# Patient Record
Sex: Female | Born: 1935 | Race: White | Hispanic: No | Smoking: Never smoker
Health system: Southern US, Community
[De-identification: ages and names within clinical notes are randomized; demographics above are authoritative.]

## PROBLEM LIST (undated history)

## (undated) DIAGNOSIS — M1990 Unspecified osteoarthritis, unspecified site: Secondary | ICD-10-CM

## (undated) DIAGNOSIS — I10 Essential (primary) hypertension: Secondary | ICD-10-CM

## (undated) DIAGNOSIS — K589 Irritable bowel syndrome without diarrhea: Secondary | ICD-10-CM

## (undated) DIAGNOSIS — K219 Gastro-esophageal reflux disease without esophagitis: Secondary | ICD-10-CM

## (undated) DIAGNOSIS — T7840XA Allergy, unspecified, initial encounter: Secondary | ICD-10-CM

## (undated) DIAGNOSIS — E785 Hyperlipidemia, unspecified: Secondary | ICD-10-CM

## (undated) DIAGNOSIS — K5730 Diverticulosis of large intestine without perforation or abscess without bleeding: Secondary | ICD-10-CM

## (undated) DIAGNOSIS — D126 Benign neoplasm of colon, unspecified: Secondary | ICD-10-CM

## (undated) DIAGNOSIS — J42 Unspecified chronic bronchitis: Secondary | ICD-10-CM

## (undated) DIAGNOSIS — K648 Other hemorrhoids: Secondary | ICD-10-CM

## (undated) HISTORY — PX: DENTAL SURGERY: SHX609

## (undated) HISTORY — DX: Essential (primary) hypertension: I10

## (undated) HISTORY — DX: Irritable bowel syndrome without diarrhea: K58.9

## (undated) HISTORY — DX: Diverticulosis of large intestine without perforation or abscess without bleeding: K57.30

## (undated) HISTORY — DX: Hyperlipidemia, unspecified: E78.5

## (undated) HISTORY — PX: COLONOSCOPY: SHX174

## (undated) HISTORY — DX: Unspecified osteoarthritis, unspecified site: M19.90

## (undated) HISTORY — DX: Other hemorrhoids: K64.8

## (undated) HISTORY — PX: VAGINAL HYSTERECTOMY: SUR661

## (undated) HISTORY — DX: Unspecified chronic bronchitis: J42

## (undated) HISTORY — PX: POLYPECTOMY: SHX149

## (undated) HISTORY — DX: Benign neoplasm of colon, unspecified: D12.6

## (undated) HISTORY — DX: Gastro-esophageal reflux disease without esophagitis: K21.9

## (undated) HISTORY — DX: Allergy, unspecified, initial encounter: T78.40XA

---

## 2000-06-15 ENCOUNTER — Other Ambulatory Visit: Admission: RE | Admit: 2000-06-15 | Discharge: 2000-06-15 | Payer: Self-pay | Admitting: Gynecology

## 2004-04-25 ENCOUNTER — Ambulatory Visit: Payer: Self-pay | Admitting: Pulmonary Disease

## 2004-07-18 ENCOUNTER — Ambulatory Visit: Payer: Self-pay | Admitting: Pulmonary Disease

## 2004-08-29 ENCOUNTER — Ambulatory Visit: Payer: Self-pay | Admitting: Cardiology

## 2004-09-12 ENCOUNTER — Ambulatory Visit: Payer: Self-pay | Admitting: Internal Medicine

## 2004-10-02 ENCOUNTER — Ambulatory Visit: Payer: Self-pay | Admitting: Cardiology

## 2004-10-25 ENCOUNTER — Ambulatory Visit: Payer: Self-pay | Admitting: Cardiology

## 2004-11-08 ENCOUNTER — Ambulatory Visit: Payer: Self-pay | Admitting: Cardiology

## 2004-12-09 ENCOUNTER — Other Ambulatory Visit: Admission: RE | Admit: 2004-12-09 | Discharge: 2004-12-09 | Payer: Self-pay | Admitting: Gynecology

## 2004-12-12 ENCOUNTER — Ambulatory Visit: Payer: Self-pay | Admitting: Cardiology

## 2005-01-20 ENCOUNTER — Ambulatory Visit: Payer: Self-pay | Admitting: Cardiology

## 2005-01-24 ENCOUNTER — Ambulatory Visit: Payer: Self-pay | Admitting: Cardiology

## 2005-04-22 ENCOUNTER — Ambulatory Visit: Payer: Self-pay | Admitting: Pulmonary Disease

## 2005-06-17 ENCOUNTER — Ambulatory Visit: Payer: Self-pay | Admitting: Pulmonary Disease

## 2005-06-25 ENCOUNTER — Ambulatory Visit: Payer: Self-pay | Admitting: Gastroenterology

## 2005-07-15 ENCOUNTER — Encounter: Payer: Self-pay | Admitting: Gastroenterology

## 2005-07-15 ENCOUNTER — Ambulatory Visit: Payer: Self-pay | Admitting: Gastroenterology

## 2006-09-14 ENCOUNTER — Ambulatory Visit: Payer: Self-pay | Admitting: Pulmonary Disease

## 2006-10-07 ENCOUNTER — Ambulatory Visit: Payer: Self-pay | Admitting: Pulmonary Disease

## 2006-10-07 LAB — CONVERTED CEMR LAB
ALT: 17 units/L (ref 0–35)
AST: 22 units/L (ref 0–37)
Alkaline Phosphatase: 41 units/L (ref 39–117)
BUN: 12 mg/dL (ref 6–23)
Basophils Relative: 1.5 % — ABNORMAL HIGH (ref 0.0–1.0)
Bilirubin, Direct: 0.1 mg/dL (ref 0.0–0.3)
Calcium: 9.2 mg/dL (ref 8.4–10.5)
Chloride: 99 meq/L (ref 96–112)
Cholesterol: 279 mg/dL (ref 0–200)
Eosinophils Absolute: 0.2 10*3/uL (ref 0.0–0.6)
Eosinophils Relative: 3.9 % (ref 0.0–5.0)
GFR calc Af Amer: 79 mL/min
GFR calc non Af Amer: 65 mL/min
Glucose, Bld: 112 mg/dL — ABNORMAL HIGH (ref 70–99)
HDL: 35.9 mg/dL — ABNORMAL LOW (ref >39.0)
MCV: 94.5 fL (ref 78.0–100.0)
Monocytes Relative: 12.7 % — ABNORMAL HIGH (ref 3.0–11.0)
Platelets: 253 10*3/uL (ref 150–400)
RBC: 4.31 M/uL (ref 3.87–5.11)
Triglycerides: 598 mg/dL (ref 0–149)
VLDL: 120 mg/dL — ABNORMAL HIGH (ref 0–40)
WBC: 5.7 10*3/uL (ref 4.5–10.5)

## 2006-10-15 ENCOUNTER — Ambulatory Visit: Payer: Self-pay | Admitting: Pulmonary Disease

## 2006-10-15 DIAGNOSIS — F411 Generalized anxiety disorder: Secondary | ICD-10-CM | POA: Insufficient documentation

## 2006-10-15 DIAGNOSIS — E782 Mixed hyperlipidemia: Secondary | ICD-10-CM

## 2006-10-15 DIAGNOSIS — K648 Other hemorrhoids: Secondary | ICD-10-CM

## 2006-10-15 DIAGNOSIS — J42 Unspecified chronic bronchitis: Secondary | ICD-10-CM | POA: Insufficient documentation

## 2006-10-15 DIAGNOSIS — I10 Essential (primary) hypertension: Secondary | ICD-10-CM

## 2006-10-15 DIAGNOSIS — K219 Gastro-esophageal reflux disease without esophagitis: Secondary | ICD-10-CM | POA: Insufficient documentation

## 2006-10-15 DIAGNOSIS — K589 Irritable bowel syndrome without diarrhea: Secondary | ICD-10-CM

## 2006-10-15 DIAGNOSIS — D126 Benign neoplasm of colon, unspecified: Secondary | ICD-10-CM

## 2006-10-15 HISTORY — DX: Irritable bowel syndrome, unspecified: K58.9

## 2006-10-15 HISTORY — DX: Other hemorrhoids: K64.8

## 2006-10-15 HISTORY — DX: Unspecified chronic bronchitis: J42

## 2006-10-15 HISTORY — DX: Benign neoplasm of colon, unspecified: D12.6

## 2006-12-16 ENCOUNTER — Ambulatory Visit: Payer: Self-pay | Admitting: Pulmonary Disease

## 2007-08-16 ENCOUNTER — Telehealth (INDEPENDENT_AMBULATORY_CARE_PROVIDER_SITE_OTHER): Payer: Self-pay | Admitting: *Deleted

## 2007-09-28 ENCOUNTER — Ambulatory Visit: Payer: Self-pay | Admitting: Pulmonary Disease

## 2007-09-28 DIAGNOSIS — K5730 Diverticulosis of large intestine without perforation or abscess without bleeding: Secondary | ICD-10-CM

## 2007-09-28 DIAGNOSIS — M1990 Unspecified osteoarthritis, unspecified site: Secondary | ICD-10-CM

## 2007-09-28 DIAGNOSIS — J309 Allergic rhinitis, unspecified: Secondary | ICD-10-CM

## 2007-09-28 HISTORY — DX: Diverticulosis of large intestine without perforation or abscess without bleeding: K57.30

## 2007-09-28 HISTORY — DX: Unspecified osteoarthritis, unspecified site: M19.90

## 2007-10-01 ENCOUNTER — Ambulatory Visit: Payer: Self-pay | Admitting: Pulmonary Disease

## 2007-10-11 LAB — CONVERTED CEMR LAB
ALT: 17 units/L (ref 0–35)
AST: 20 units/L (ref 0–37)
Alkaline Phosphatase: 48 units/L (ref 39–117)
Bilirubin, Direct: 0.1 mg/dL (ref 0.0–0.3)
CO2: 33 meq/L — ABNORMAL HIGH (ref 19–32)
Chloride: 99 meq/L (ref 96–112)
Cholesterol: 355 mg/dL (ref 0–200)
Creatinine, Ser: 0.7 mg/dL (ref 0.4–1.2)
GFR calc non Af Amer: 87 mL/min
HCT: 40.3 % (ref 36.0–46.0)
HDL: 21.9 mg/dL — ABNORMAL LOW (ref >39.0)
Hgb A1c MFr Bld: 6 % (ref 4.6–6.0)
Platelets: 227 10*3/uL (ref 150–400)
Potassium: 3.8 meq/L (ref 3.5–5.1)
RDW: 12.5 % (ref 11.5–14.6)
Sodium: 139 meq/L (ref 135–145)
Total Bilirubin: 0.8 mg/dL (ref 0.3–1.2)
Total CHOL/HDL Ratio: 16.2
Triglycerides: 1604 mg/dL (ref 0–149)
WBC: 6.4 10*3/uL (ref 4.5–10.5)

## 2007-10-14 ENCOUNTER — Ambulatory Visit: Payer: Self-pay | Admitting: Internal Medicine

## 2007-11-03 ENCOUNTER — Ambulatory Visit: Payer: Self-pay | Admitting: Pulmonary Disease

## 2007-12-20 ENCOUNTER — Ambulatory Visit: Payer: Self-pay | Admitting: Pulmonary Disease

## 2007-12-20 LAB — CONVERTED CEMR LAB
ALT: 25 units/L (ref 0–35)
Bilirubin, Direct: 0.2 mg/dL (ref 0.0–0.3)
Direct LDL: 73 mg/dL
HDL: 32.9 mg/dL — ABNORMAL LOW (ref >39.0)
Total Bilirubin: 1.1 mg/dL (ref 0.3–1.2)
Total Protein: 7 g/dL (ref 6.0–8.3)
Triglycerides: 419 mg/dL (ref 0–149)
VLDL: 84 mg/dL — ABNORMAL HIGH (ref 0–40)

## 2007-12-23 ENCOUNTER — Ambulatory Visit: Payer: Self-pay | Admitting: Cardiology

## 2008-03-06 ENCOUNTER — Ambulatory Visit: Payer: Self-pay | Admitting: Pulmonary Disease

## 2008-03-16 ENCOUNTER — Ambulatory Visit: Payer: Self-pay | Admitting: Internal Medicine

## 2008-04-07 ENCOUNTER — Telehealth: Payer: Self-pay | Admitting: Pulmonary Disease

## 2008-04-13 ENCOUNTER — Ambulatory Visit: Payer: Self-pay | Admitting: Pulmonary Disease

## 2008-06-04 LAB — CONVERTED CEMR LAB
ALT: 24 units/L (ref 0–35)
AST: 22 units/L (ref 0–37)
Albumin: 3.5 g/dL (ref 3.5–5.2)
BUN: 13 mg/dL (ref 6–23)
CO2: 34 meq/L — ABNORMAL HIGH (ref 19–32)
Calcium: 9.4 mg/dL (ref 8.4–10.5)
Chloride: 104 meq/L (ref 96–112)
Cholesterol: 171 mg/dL (ref 0–200)
Creatinine, Ser: 0.9 mg/dL (ref 0.4–1.2)
Direct LDL: 68.3 mg/dL
Triglycerides: 395 mg/dL (ref 0–149)
VLDL: 79 mg/dL — ABNORMAL HIGH (ref 0–40)

## 2008-06-05 ENCOUNTER — Ambulatory Visit: Payer: Self-pay | Admitting: Pulmonary Disease

## 2008-06-12 ENCOUNTER — Encounter: Payer: Self-pay | Admitting: Internal Medicine

## 2008-06-12 ENCOUNTER — Ambulatory Visit: Payer: Self-pay | Admitting: Cardiology

## 2008-06-20 ENCOUNTER — Telehealth: Payer: Self-pay | Admitting: Pulmonary Disease

## 2008-06-30 LAB — CONVERTED CEMR LAB
ALT: 24 units/L (ref 0–35)
AST: 27 units/L (ref 0–37)
Alkaline Phosphatase: 55 units/L (ref 39–117)
Bilirubin, Direct: 0.1 mg/dL (ref 0.0–0.3)
Cholesterol: 193 mg/dL (ref 0–200)
Total Protein: 7.1 g/dL (ref 6.0–8.3)

## 2008-07-03 ENCOUNTER — Encounter: Payer: Self-pay | Admitting: Internal Medicine

## 2008-08-28 ENCOUNTER — Ambulatory Visit: Payer: Self-pay | Admitting: Cardiology

## 2008-08-28 LAB — CONVERTED CEMR LAB
AST: 27 units/L (ref 0–37)
Alkaline Phosphatase: 53 units/L (ref 39–117)
Bilirubin, Direct: 0.1 mg/dL (ref 0.0–0.3)
Direct LDL: 60.1 mg/dL
Total Bilirubin: 0.9 mg/dL (ref 0.3–1.2)
Total CHOL/HDL Ratio: 5

## 2008-09-04 ENCOUNTER — Ambulatory Visit: Payer: Self-pay | Admitting: Cardiology

## 2008-11-17 ENCOUNTER — Ambulatory Visit: Payer: Self-pay | Admitting: Pulmonary Disease

## 2008-11-20 ENCOUNTER — Ambulatory Visit: Payer: Self-pay | Admitting: Pulmonary Disease

## 2008-11-28 ENCOUNTER — Ambulatory Visit: Payer: Self-pay | Admitting: Cardiology

## 2008-11-29 ENCOUNTER — Encounter: Payer: Self-pay | Admitting: Cardiology

## 2008-11-29 LAB — CONVERTED CEMR LAB
Cholesterol, target level: 200 mg/dL
LDL Goal: 70 mg/dL

## 2008-12-10 LAB — CONVERTED CEMR LAB
Bilirubin, Direct: 0.1 mg/dL (ref 0.0–0.3)
HDL: 35.8 mg/dL — ABNORMAL LOW (ref >39.00)
Total Bilirubin: 0.8 mg/dL (ref 0.3–1.2)
Total CHOL/HDL Ratio: 5
Total Protein: 6.8 g/dL (ref 6.0–8.3)
VLDL: 87.6 mg/dL — ABNORMAL HIGH (ref 0.0–40.0)

## 2009-01-08 ENCOUNTER — Telehealth: Payer: Self-pay | Admitting: Pulmonary Disease

## 2009-02-28 ENCOUNTER — Ambulatory Visit: Payer: Self-pay | Admitting: Pulmonary Disease

## 2009-03-05 ENCOUNTER — Ambulatory Visit: Payer: Self-pay | Admitting: Pulmonary Disease

## 2009-03-05 LAB — CONVERTED CEMR LAB
ALT: 22 units/L (ref 0–35)
AST: 29 units/L (ref 0–37)
Bilirubin, Direct: 0.1 mg/dL (ref 0.0–0.3)
Direct LDL: 89.2 mg/dL
Total Bilirubin: 0.9 mg/dL (ref 0.3–1.2)
Triglycerides: 213 mg/dL — ABNORMAL HIGH (ref 0.0–149.0)

## 2009-03-15 ENCOUNTER — Ambulatory Visit: Payer: Self-pay | Admitting: Cardiology

## 2009-06-04 ENCOUNTER — Ambulatory Visit: Payer: Self-pay | Admitting: Pulmonary Disease

## 2009-06-11 ENCOUNTER — Ambulatory Visit: Payer: Self-pay | Admitting: Cardiology

## 2009-06-11 LAB — CONVERTED CEMR LAB
Alkaline Phosphatase: 43 units/L (ref 39–117)
Bilirubin, Direct: 0 mg/dL (ref 0.0–0.3)
Total CHOL/HDL Ratio: 5
VLDL: 62.4 mg/dL — ABNORMAL HIGH (ref 0.0–40.0)

## 2009-07-12 ENCOUNTER — Telehealth (INDEPENDENT_AMBULATORY_CARE_PROVIDER_SITE_OTHER): Payer: Self-pay | Admitting: *Deleted

## 2009-08-15 ENCOUNTER — Encounter: Payer: Self-pay | Admitting: Pulmonary Disease

## 2009-09-03 ENCOUNTER — Ambulatory Visit: Payer: Self-pay | Admitting: Pulmonary Disease

## 2009-09-05 LAB — CONVERTED CEMR LAB
Albumin: 3.8 g/dL (ref 3.5–5.2)
Cholesterol: 223 mg/dL — ABNORMAL HIGH (ref 0–200)
Direct LDL: 133.4 mg/dL
HDL: 35.7 mg/dL — ABNORMAL LOW (ref >39.00)
Total Protein: 6.7 g/dL (ref 6.0–8.3)
VLDL: 50.4 mg/dL — ABNORMAL HIGH (ref 0.0–40.0)

## 2009-09-10 ENCOUNTER — Ambulatory Visit: Payer: Self-pay | Admitting: Cardiovascular Disease

## 2009-09-24 ENCOUNTER — Encounter: Payer: Self-pay | Admitting: Pulmonary Disease

## 2009-10-18 ENCOUNTER — Encounter: Payer: Self-pay | Admitting: Pulmonary Disease

## 2009-10-19 ENCOUNTER — Telehealth: Payer: Self-pay | Admitting: Pulmonary Disease

## 2009-11-13 ENCOUNTER — Ambulatory Visit: Payer: Self-pay | Admitting: Pulmonary Disease

## 2009-11-19 ENCOUNTER — Telehealth: Payer: Self-pay | Admitting: Pulmonary Disease

## 2009-11-23 ENCOUNTER — Telehealth (INDEPENDENT_AMBULATORY_CARE_PROVIDER_SITE_OTHER): Payer: Self-pay | Admitting: *Deleted

## 2009-11-23 LAB — CONVERTED CEMR LAB
ALT: 20 units/L (ref 0–35)
AST: 26 units/L (ref 0–37)
Albumin: 3.7 g/dL (ref 3.5–5.2)
Cholesterol: 248 mg/dL — ABNORMAL HIGH (ref 0–200)
Direct LDL: 78 mg/dL
Triglycerides: 661 mg/dL — ABNORMAL HIGH (ref 0.0–149.0)

## 2010-02-27 ENCOUNTER — Ambulatory Visit
Admission: RE | Admit: 2010-02-27 | Discharge: 2010-02-27 | Payer: Self-pay | Source: Home / Self Care | Attending: Pulmonary Disease | Admitting: Pulmonary Disease

## 2010-03-01 ENCOUNTER — Ambulatory Visit: Admit: 2010-03-01 | Payer: Self-pay | Admitting: Pulmonary Disease

## 2010-03-14 NOTE — Letter (Signed)
[facility]   Imported By: Sherian Rein 10/04/2009 10:24:13  _____________________________________________________________________  External Attachment:    Type:   Image     Comment:   External Document

## 2010-03-14 NOTE — Progress Notes (Signed)
Summary: returning call  Phone Note Call from Patient Call back at Home Phone [phone]   Caller: Patient Call For: nadel Summary of Call: Returning Leigh's call. Initial call taken by: Darletta Moll,  November 23, 2009 9:13 AM  Follow-up for Phone Call        Done- spoke with pt and notified her of appt with lipid clinic on 10/24- pt is aware of appt and states that she will keep this. Follow-up by: Vernie Murders,  November 23, 2009 10:27 AM

## 2010-03-14 NOTE — Progress Notes (Signed)
Summary: cough and congestion  Phone Note Call from Patient Call back at Home Phone [phone]   Caller: Patient Call For: nadel Reason for Call: Talk to Nurse Summary of Call: pt has head and chest congestion, no color, cough, getting in her lungs, nasal congestion.  Can you call something in? [facility] [place] Initial call taken by: Eugene Gavia,  October 19, 2009 11:05 AM  Follow-up for Phone Call        Called and spoke with pt.  She c/o chest congestion and prod cough with light yellow sputum x 3 days. Denies any fever, SOB, wheezing.  Would like something called in.  Last seen by [facility] Aug 2009! No appts available today.  Pls advise thanks Allergies (verified):  1)  ! Penicillin V Potassium (Penicillin V Potassium) 2)  ! Septra (Sulfamethoxazole-Trimethoprim) 3)  ! Sulfa 4)  ! * Iron Tablets 5)  Lopid (Gemfibrozil) 6)  Tricor (Fenofibrate) 7)  Antara (Fenofibrate Micronized) 8)  Niaspan (Niacin (Antihyperlipidemic))  Follow-up by: Vernie Murders,  October 19, 2009 11:37 AM  Additional Follow-up for Phone Call Additional follow up Details #1::        per SN----ok for pt to have ZPAK  #1  take as directed and otc mucinex 1-2 tabs two times a day with plenty of fluids.  thanks Randell Loop CMA  October 19, 2009 11:50 AM  pt advised. rzx sent. Carron Curie CMA  October 19, 2009 11:59 AM      New/Updated Medications: ZITHROMAX Z-PAK 250 MG TABS (AZITHROMYCIN) as directed Prescriptions: ZITHROMAX Z-PAK 250 MG TABS (AZITHROMYCIN) as directed  #1 pak x 0   Entered by:   Carron Curie CMA   Authorized by:   Michele Mcalpine MD   Signed by:   Carron Curie CMA on 10/19/2009   Method used:   Electronically to        [facility] (retail)       [address]       [place], [place]  [address]       Ph: [phone]       Fax: [phone]   RxID:   [id number]

## 2010-03-14 NOTE — Assessment & Plan Note (Signed)
Summary: follow up for meds/la   CC:  Yearly ROV & review of mult medical problems....  History of Present Illness: 77 y/o WF here for a follow up visit... he has multiple medical problems as noted below...    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 she has no new complaints or concerns today, but she requests a switch back to Lipitor from the Zocor...   ~  February 28, 2009:  she has been working w/ the Lipid Clinic and Chol numbers are OK, but TG still elevated >> she was given samples of Trilipix & liked these capsules but filled Rx for generic fenofibrate pills and didn't like this...  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   ~  February 27, 2010:  she's had a good yr- no new complaints or concerns... she wants 90d refills & will return for fasting labs...  Breathing stable on Prn Flonase & Flovent;  BP controlled on her 3 meds below;  she is working w/ the Lipid Clinic but stopped the Trilipix due to leg pain & her TG's were >600 in Oct... she saw DrMacDiarmid 9/11 for urinary incontinence & overactive bladder> ?on [place] & Vesicare w/ f/u pending...    Current Problem List:  ALLERGIC RHINITIS (ICD-477.9) - she uses FLONASE 2sp in each nostril Qhs + OTC Zyrtek etc...  BRONCHITIS, RECURRENT (ICD-491.9) - on FLOVENT 44- 2 sp Bid w/ spacer but only using Prn & ave 2/wk...  ESSENTIAL HYPERTENSION (ICD-401.9) - on ASA 81mg /d,  ATENOLOL 50mg /d,  AMLODIPINE 5mg /d,  DIOVAN/Hct 160-12.5 daily... BP= 122/74 and feeling well... denies HA, fatigue, visual changes, CP, palipit, dizziness, syncope, dyspnea, edema, etc...  HYPERLIPIDEMIA, MIXED (ICD-272.2) - on LIPITOR 40mg /d per Lipid Clinc, & they have been working on her TG's... added FishOil 4gm/d, but INTOL to mult Fibrate meds including  Trilipix which caused leg discomfort...  ~  labs 9/08 on diet alone showed TChol 279, TG 598, HDL 36, LDL 107... rec Zocor40, CoQ10...  ~  FLP 8/09 on Simva40 showed TChol 355, TG 1604, HDL 22, LDL 48... refer to LipidClinic.  ~  FLP 1/10 showed TChol 171, TG 395, HDL 34, LDL 68... not taking Trilipix due to $$$.  ~  FLP 7/10 showed TChol 182, TG 491, HDL 34, LDL 60... not taking Fenofib due to "reaction"  ~  FLP 1/11 showed TChol 165, TG 213, HDL 38, LDL 89  ~  serial FLPs followed by Lipid Clinic & their notes are reviewed...  GERD (ICD-530.81) - controlled on Zantac75 as needed  DIVERTICULOSIS OF COLON (ICD-562.10) IRRITABLE BOWEL SYNDROME (ICD-564.1) HEMORRHOIDS, INTERNAL (ICD-455.0) COLONIC POLYPS, ADENOMATOUS (ICD-211.3) - last colonoscopy = 6/07 DrStark +adenomatous polyp/ divertics/ int hem; f/u 5 yrs.  DEGENERATIVE JOINT DISEASE (ICD-715.90) - spurs seen on prev spine films... she takes Tylenol, OSCAL Bid, & MVI.  ANXIETY (ICD-300.00)   Preventive Screening-Counseling & Management  Alcohol-Tobacco     Alcohol drinks/day: 0     Smoking Status: never  Caffeine-Diet-Exercise     Does Patient Exercise: yes     Type of exercise: walks     Times/week: 3     Exercise Counseling: to improve exercise regimen  Allergies: 1)  ! Penicillin V Potassium (Penicillin V Potassium) 2)  ! Septra  3)  ! Sulfa 4)  ! * Iron Tablets 5)  Lopid (Gemfibrozil) 6)  Tricor (Fenofibrate) 7)  Antara (Fenofibrate Micronized) 8)  Niaspan (Niacin (Antihyperlipidemic))  Comments:  Nurse/Medical Assistant: The patient's medications and allergies were reviewed with the patient and were updated in the Medication and Allergy Lists.  Past History:  Past Medical History: ALLERGIC RHINITIS (ICD-477.9) BRONCHITIS, RECURRENT (ICD-491.9) ESSENTIAL HYPERTENSION (ICD-401.9) HYPERLIPIDEMIA, MIXED (ICD-272.2) GERD (ICD-530.81) DIVERTICULOSIS OF COLON (ICD-562.10) IRRITABLE BOWEL SYNDROME  (ICD-564.1) COLONIC POLYPS, ADENOMATOUS (ICD-211.3) HEMORRHOIDS, INTERNAL (ICD-455.0) DEGENERATIVE JOINT DISEASE (ICD-715.90) ANXIETY (ICD-300.00)  Past Surgical History: Hysterectomy - 1966 for metrorrhagia & fibroids  Family History: Reviewed history from 02/28/2009 and no changes required. Father died age 21 from MI, HBP, & alcoholism... Mother died age 32 from heart disease... 2 Siblings: Brother died age 28 from Leukemia... Sister died age 59 w/ a congenital heart prob only dx at autopsy  Social History: Reviewed history from 02/28/2009 and no changes required. Widow, husb died 53... 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    Vital Signs:  Patient profile:   77 year old female Height:      61 inches Weight:      149.38 pounds BMI:     28.33 O2 Sat:      95 % on room air Temp:     97.7 degrees F oral Pulse rate:   62 / minute BP sitting:   122 / 74  (left arm) Cuff size:   regular  Vitals Entered By: Randell Loop CMA (February 27, 2010 12:04 PM)  O2 Sat at Rest %:  95 O2 Flow:  room air CC: Yearly ROV & review of mult medical problems... Is Patient Diabetic? No Pain Assessment Patient in pain? no      Comments meds updated today with pt   Physical Exam  Additional Exam:  WD, WN, 77 y/o WF in NAD... GENERAL:  Alert & oriented; pleasant & cooperative... HEENT:  [place]/AT, Glasses, EOM-wnl,  EACs-clear,  TMs-wnl, NOSE-pale w/ clear drainage.  THROAT-clear & wnl. NECK:  Supple w/ fairROM; no JVD; normal carotid impulses w/o bruits; no thyromegaly or nodules palpated; no lymphadenopathy. CHEST:  Clear- no wheezes, rales, or rhonchi heard... HEART:  Regular Rhythm; without murmurs/ rubs/ or gallops detected... ABDOMEN:  Soft & nontender; normal bowel sounds; no organomegaly or masses palapted... EXT: without deformities, mild arthritic changes; no varicose veins/ venous insuffic/ or edema. NEURO:  CN's intact;  no focal neuro deficits... DERM:  no lesions noted...    MISC. Report  Procedure date:  02/27/2010  Findings:      She will return to our lab one morning soon for FASTING blood work & we will communicate the results to her via phone tree... SN   Impression & Recommendations:  Problem # 1:  BRONCHITIS, RECURRENT (ICD-491.9) Stable>  no recurrent bronchitic problems...  Problem # 2:  ESSENTIAL HYPERTENSION (ICD-401.9) BP controlled>  continue same Rx. Her updated medication list for this problem includes:    Atenolol 50 Mg Tabs (Atenolol) .Marland Kitchen... 1 tab daily  Amlodipine Besylate 5 Mg Tabs (Amlodipine besylate) .Marland Kitchen... 1 tab daily    Diovan Hct 160-12.5 Mg Tabs (Valsartan-hydrochlorothiazide) .Marland Kitchen... 1 tab daily  Problem # 3:  HYPERLIPIDEMIA, MIXED (ICD-272.2) She is on Lip40 + Fish oil, but intol to Fibrates... followed in the Lipid Clinc & f/u FLP pending. The following medications were removed from the medication list:    Trilipix 135 Mg Cpdr (Choline fenofibrate) .Marland Kitchen... Take 1 capsule by mouth once daily... Her updated medication list for this problem includes:    Lipitor 40 Mg Tabs (Atorvastatin calcium) .Marland Kitchen... Take 1 tab by mouth at bedtime...  Problem # 4:  DIVERTICULOSIS OF COLON (ICD-562.10) GI is stable & she is due for f/u colon in 2012...  Problem # 5:  DEGENERATIVE JOINT DISEASE (ICD-715.90) Stable>  using OTC meds Prn... Her updated medication list for this problem  includes:    Aspirin 81 Mg Tbec (Aspirin) .Marland Kitchen... 1 tab daily    Tylenol Extra Strength 500 Mg Tabs (Acetaminophen) .Marland Kitchen... Take 1-2 tablets every 4-6 hours as needed  Complete Medication List: 1)  Flonase 50 Mcg/act Susp (Fluticasone propionate) .... 2 sprays in each nostril as needed 2)  Flovent Hfa 44 Mcg/act Aero (Fluticasone propionate  hfa) .... Inhale 2 puffs two times a day 3)  Aspirin 81 Mg Tbec (Aspirin) .Marland Kitchen.. 1 tab daily 4)  Atenolol 50 Mg Tabs (Atenolol) .Marland Kitchen.. 1 tab daily 5)  Amlodipine Besylate 5 Mg Tabs (Amlodipine besylate) .Marland Kitchen.. 1 tab daily 6)  Diovan Hct 160-12.5 Mg Tabs (Valsartan-hydrochlorothiazide) .Marland Kitchen.. 1 tab daily 7)  Lipitor 40 Mg Tabs (Atorvastatin calcium) .... Take 1 tab by mouth at bedtime.Marland KitchenMarland Kitchen 8)  Eql Fish Oil 1000 Mg Caps (Omega-3 fatty acids) .... Take 4  daily 9)  Co Q-10 50 Mg Caps (Coenzyme q10) .Marland Kitchen.. 1 daily 10)  Zantac 75 75 Mg Tabs (Ranitidine hcl) .... Take 1 tab by mouth two times a day as needed... 11)  Oscal 500/200 D-3 500-200 Mg-unit Tabs (Calcium-vitamin d) .... Take 1 tab by mouth two times a day... 12)  Tylenol Extra Strength 500 Mg Tabs (Acetaminophen) .... Take 1-2 tablets every 4-6 hours as needed  Patient Instructions: 1)  Today we updated your med list- see below.... 2)  We refilled your meds for 2012... 3)  Please return to our lab one morning soon for your FASTING blood work... then please call the "phone tree" in a few days for your lab results.Marland KitchenMarland Kitchen 4)  Keep up the good work w/ diet & exercise!!! 5)  Call for any problems.Marland KitchenMarland Kitchen 6)  Please schedule a follow-up appointment in 1 year, sooner as needed. Prescriptions: LIPITOR 40 MG  TABS (ATORVASTATIN CALCIUM) take 1 tab by mouth at bedtime...  #90 x 4   Entered and Authorized by:   Michele Mcalpine MD   Signed by:   Michele Mcalpine MD on 02/27/2010   Method used:   Print then Give to Patient   RxID:   [id number] DIOVAN HCT 160-12.5 MG  TABS (VALSARTAN-HYDROCHLOROTHIAZIDE) 1 tab daily  #90 x 4    Entered and Authorized by:   Michele Mcalpine MD   Signed by:   Michele Mcalpine MD on 02/27/2010   Method used:   Print then Give to Patient   RxID:   [id number] AMLODIPINE BESYLATE 5 MG  TABS (AMLODIPINE BESYLATE) 1 tab daily  #90 x 4   Entered and Authorized by:   Michele Mcalpine MD   Signed by:   Michele Mcalpine  MD on 02/27/2010   Method used:   Print then Give to Patient   RxID:   [id number] ATENOLOL 50 MG  TABS (ATENOLOL) 1 tab daily  #90 x 4   Entered and Authorized by:   Michele Mcalpine MD   Signed by:   Michele Mcalpine MD on 02/27/2010   Method used:   Print then Give to Patient   RxID:   [id number] FLOVENT HFA 44 MCG/ACT  AERO (FLUTICASONE PROPIONATE  HFA) inhale 2 puffs two times a day  #1 x 12   Entered and Authorized by:   Michele Mcalpine MD   Signed by:   Michele Mcalpine MD on 02/27/2010   Method used:   Print then Give to Patient   RxID:   [id number] FLONASE 50 MCG/ACT  SUSP (FLUTICASONE PROPIONATE) 2 sprays in each nostril as needed  #1 x 12   Entered and Authorized by:   Michele Mcalpine MD   Signed by:   Michele Mcalpine MD on 02/27/2010   Method used:   Print then Give to Patient   RxID:   [id number]

## 2010-03-14 NOTE — Letter (Signed)
Summary: [facility]   Imported By: Lennie Odor 09/11/2009 12:23:00  _____________________________________________________________________  External Attachment:    Type:   Image     Comment:   External Document

## 2010-03-14 NOTE — Assessment & Plan Note (Signed)
Summary: follow-up lipid visit/ewj   CC:  dyslipidemia follow-up.  History of Present Illness:  Lipid Clinic Visit      [name] presents for dyslipidemia follow-up.  The patient has no history of chest pain, shortness of breath, muscle aches, and muscle cramps.  Adjunctive measures being instituted include omega-3 supplements and CoQ10.  Risk factor modification shows that the patient is doing minimal exercising, most of which consists of "staying on her feet" at work.  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  Compliance with medication is good.  Patient adheres to a healthy diet.  Breakfast usually consists of honey nut cheerios with a fruit cup or applesauce.  Lunch is usually soup or salad.  She eats mainly chicken or [place] and avoids frying, instead she bakes or broils.  Pt reports getting 2-3 servings of veg per day.    At previous lipid visit in October, pt was asked to start fenofibrate.  She did begin taking this and continued for  ~2 weeks; however, she was experiencing significant nausea and upset stomach and d/c taking the medication.  She says she is willing to try this medication again.   [name] returns to lipid clinic for continued follow-up / assessment of lipid parameters for treatment of her hyperlipidemia.  She currently uses lipitor 40 mg daily, fish oil 4 capsules daily, and coenzyme q 10 50 mg daily.  She has tolerated these therapies well, but states that her compliance has not been "super".  She tolerated the trilipix well, but was not able to afford refills on this medication ([employer] $75 / month?)  Dietary review:  The patient selects honey nut cheerios for breakfast most mornings.   Lunch usually involves whole wheat [place] sandwiches. Suppers:  The patient often selects salads in the evening with chopped [place]. Snack selections:  1/2 ensure can, apples, sugar-free peaches.  The patient does not smoke, and does  not consume alcohol.    Lipid Management Provider  Eda Keys, PharmD  Allergies: 1)  ! Penicillin V Potassium (Penicillin V Potassium) 2)  ! Septra (Sulfamethoxazole-Trimethoprim) 3)  ! Sulfa 4)  ! * Iron Tablets 5)  Lopid (Gemfibrozil) 6)  Tricor (Fenofibrate) 7)  Antara (Fenofibrate Micronized) 8)  Niaspan (Niacin (Antihyperlipidemic))   Vital Signs:  Patient profile:   77 year old female Height:      61 inches Weight:      147 pounds BP sitting:   126 / 74  Impression & Recommendations:  Problem # 1:  HYPERLIPIDEMIA, MIXED (ICD-272.2) [name] is a pleasant patient will appropriate diet and currently mild exercise regimen.  Lipid values today are TC 165 (previously 185), TG 213 (previously 438), HDL 38.4 (previously 35), and LDL 89.2 (previously 78.5) with goal LDL <70.  Her TG has been the largest concern and is significantly improved at this visit.  LDL and HDL remain slightly out of goal.  She has not been taking fenofibrate since October b/c it caused N/V.  She did take it at night in an empty stomach and is willing to retry taking it with food to see if it is better tolerated.  Plan includes continue medications as prescribed, increasing exercise when able.  Patient states she may be able to get some friends from work to exercise with her.  Pt will also try taking fenofibrate again, this time with food to attempt to decrease N/V.  Return to clinic in 6 months.  At this time, will consider increasing or changing statin depending on labs and results of combination therapy with fenofibrate.   Her updated medication list for this problem includes:    Lipitor 40 Mg Tabs (Atorvastatin calcium) .Marland Kitchen... Take 1 tab by mouth at bedtime...    Trilipix 135 Mg Cpdr (Choline fenofibrate) .Marland Kitchen... Take 1 capsule by mouth once daily...     Fish Oil 1 gram caps  takes 4 caps once daily

## 2010-03-14 NOTE — Progress Notes (Signed)
Summary: MEDICATION  Phone Note Call from Patient   Caller: Patient Call For: NADEL Summary of Call: PT WOULD LIKE LIPITOR CALLED TO [facility] FOR 3 MONTHS SUPPLY. CALL [phone] Initial call taken by: Rickard Patience,  November 19, 2009 2:45 PM  Follow-up for Phone Call        lmomtcb to see if pt has ever used [facility] before....and we can only to 3 month supply with no refills---pt will need ov with SN for further refills Randell Loop [facility]  November 19, 2009 3:49 PM   Additional Follow-up for Phone Call Additional follow up Details #1::        pt returned my call and she is aware of meds being sent to [facility] for #90 with no refills---she made appt to come in and see SN  in dec. Randell Loop CMA  November 19, 2009 4:29 PM     Prescriptions: LIPITOR 40 MG  TABS (ATORVASTATIN CALCIUM) take 1 tab by mouth at bedtime...  #90 x 0   Entered by:   Randell Loop CMA   Authorized by:   Michele Mcalpine MD   Signed by:   Randell Loop CMA on 11/19/2009   Method used:   Faxed to ...       [facility]* (retail)             ,          Ph: [phone]       Fax: [phone]   RxID:   [id number]

## 2010-03-14 NOTE — Assessment & Plan Note (Signed)
Summary: rov/eac   Visit Type:  Follow-up  CC:  dyslipidemia follow-up.  History of Present Illness:  Lipid Clinic Visit      The patient comes in today for dyslipidemia follow-up.  The patient presents with medication problems related to her fenofibrate including nausea and GI upset, but has no history of chest pain, muscle aches, and muscle cramps.  Dietary compliance review reveals an overall grade of A, eating 5 or more fruits and vegetables, and limiting fats and TFA's.  Review of exercise habits reveals that the patient is walking, 3 times a week, and for 20-30 minutes.  Adjunctive measures being instituted include omega-3 supplements.  Risk factor modification shows that the patient is exercising.    Rhonda Weaver returns to lipid clinic for continued follow-up / assessment of lipid parameters for treatment of her hyperlipidemia.  She currently uses lipitor 40 mg daily, fish oil 4 capsules daily, and coenzyme q 10 50 mg daily.  She has tolerated these therapies well, but states that her compliance has not been "super".  At last visit she was unable to tolerate generic fenofibrate due to GI disturbance, even after trying to take with meals.  She is also bothered by the "chalky tablets" that seem to get stuck in her throat.    Dietary review:  The patient selects honey nut cheerios for breakfast most mornings, and has either a mandarin orange cup or applesauce.  She also has started eating one egg white each morning.     Lunch usually involves whole wheat [place] sandwiches or salads (she has started using a new low-fat vinegrette dressing). Suppers:  The patient often selects baked chicken and lean meats with plenty of vegetables.  She may treat herself to a steak dinner once every 6 months.   Snacks:  Patient does not like sweets and tries to avoid these.   The patient does not smoke, and does not consume alcohol.  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  Aside from work, she walks around her yard for about 20 minutes at least 3x/wk.      Lipid Management Provider  Eda Keys, PharmD  Allergies: 1)  ! Penicillin V Potassium (Penicillin V Potassium) 2)  ! Septra (Sulfamethoxazole-Trimethoprim) 3)  ! Sulfa 4)  ! * Iron Tablets 5)  Lopid (Gemfibrozil) 6)  Tricor (Fenofibrate) 7)  Antara (Fenofibrate Micronized) 8)  Niaspan (Niacin (Antihyperlipidemic))   Impression & Recommendations:  Problem # 1:  HYPERLIPIDEMIA, MIXED (ICD-272.2) Assessment Deteriorated Current lipid levels are TC 135 (goal <200), TG 312 (goal <150), HDL 36 (goal >50), LDL 87.7 (goal <70).  Patients TGs are her biggest problem and have yet to be under control.  TG levels have worsened since last visit with [place].  Patient has tolerated all medications well, except for generic fenofibrate.  She is intolerant to GI adverse events and "chalky" tablet.  Patient's diet remains good, I have re-emphasized importance of low fat, hi fiber choices.  Patient is exercising more than she was at last follow-up, and I have encouraged her to continue this and increase as possible especially since the weather is warming up.   Plan:   Start Brand Trilipix 135 mg daily. I have supplied the patient with samples of this #28, and have instructed her to call the lipid clinic with problems or when she needs [place] to send in a prescription for this.  Continue making healthy dietary choices and continue exercising.   Follow-up with lipid clinic in 3 months.  Her updated medication list for this problem includes:    Lipitor 40 Mg Tabs (Atorvastatin calcium) .Marland Kitchen... Take 1 tab by mouth at bedtime...    Trilipix 135 Mg Cpdr (Choline fenofibrate) .Marland Kitchen... Take 1 capsule by mouth once daily...  Patient Instructions: 1)  It was a pleasure meeting with you today. 2)  Please do the following: 3)  Continue healthy eating habits, increase fiber,  lower fat intake, eat plenty of fruits and vegetables. 4)  Exercise as much as you can. 5)  Start taking Trilipix 135 mg once daily with a meal.   6)  Call the Lipid Clinic for a refill once you begin running out of samples.  Ask for Lanora Manis.   7)  Return to clinic in 3 months for blood work and a follow-up appointment.

## 2010-03-14 NOTE — Progress Notes (Signed)
Summary: Trilipix refill   Phone Note Refill Request Call back at Home Phone [phone]   Refills Requested: Medication #1:  TRILIPIX 135 MG CPDR take 1 capsule by mouth once daily....   Supply Requested: 3 months [facility] in [place]   Method Requested: Telephone to Pharmacy Initial call taken by: Migdalia Dk,  July 12, 2009 9:18 AM Caller: Patient    Prescriptions: TRILIPIX 135 MG CPDR (CHOLINE FENOFIBRATE) take 1 capsule by mouth once daily...  #90 x 3   Entered by:   Weston Brass PharmD   Authorized by:   Nathen May, MD, [facility]   Signed by:   Weston Brass PharmD on 07/12/2009   Method used:   Electronically to        [facility] (retail)       [address]       [place], [place]  [address]       Ph: [phone]       Fax: [phone]   RxID:   [id number]

## 2010-03-14 NOTE — Assessment & Plan Note (Signed)
Summary: lipid rov/eac   Rhonda Weaver returns to lipid clinic for dyslipidemia follow-up.  She currently uses lipitor 40 mg daily, fish oil 4 capsules daily,Trilipix and coenzyme q 10 50 mg daily.  She does not complain of chest pain, muscle aches or muscle cramps.  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    Pt tries to follow a diet low in carbohydrates but admits having some difficulties with this lately.  She has been eating Cheerios for breakfast but has changed to Raisin Bran and an occassional boiled egg.  At lunch she may eat a grilled cheese (with low-fat cheese) or a salad with vinegar and oil. Her dinners lately have not as healthy.  She has been ordering fast food, pizza, etc at work about 4 days  a week.  Her snacks are often apple sauce or vanilla wafers  The patient does not smoke, and does not consume alcohol.  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  Aside from work, she walks around her yard for about 20 minutes at least 3x/wk.      Lipid Management Provider  Weston Brass, PharmD  Current Medications (verified): 1)  Flonase 50 Mcg/act  Susp (Fluticasone Propionate) .... 2 Sprays in Each Nostril As Needed 2)  Flovent Hfa 44 Mcg/act  Aero (Fluticasone Propionate  Hfa) .... Inhale 2 Puffs Two Times A Day 3)  Aspirin 81 Mg  Tbec (Aspirin) .Marland Kitchen.. 1 Tab Daily 4)  Atenolol 50 Mg  Tabs (Atenolol) .Marland Kitchen.. 1 Tab Daily 5)  Amlodipine Besylate 5 Mg  Tabs (Amlodipine Besylate) .Marland Kitchen.. 1 Tab Daily 6)  Diovan Hct 160-12.5 Mg  Tabs (Valsartan-Hydrochlorothiazide) .Marland Kitchen.. 1 Tab Daily 7)  Lipitor 40 Mg  Tabs (Atorvastatin Calcium) .... Take 1 Tab By Mouth At Bedtime.Marland KitchenMarland Kitchen 8)  Eql Fish Oil 1000 Mg Caps (Omega-3 Fatty Acids) .... Take 4  Daily 9)  Co Q-10 50 Mg Caps (Coenzyme Q10) .Marland Kitchen.. 1 Daily 10)  Zantac 75 75 Mg  Tabs (Ranitidine Hcl) .... Take 1 Tab By Mouth Two Times A Day As Needed... 11)  Oscal 500/200  D-3 500-200 Mg-Unit  Tabs (Calcium-Vitamin D) .... Take 1 Tab By Mouth Two Times A Day... 12)  Tylenol Extra Strength 500 Mg Tabs (Acetaminophen) .... Take 1-2 Tablets Every 4-6 Hours As Needed 13)  Trilipix 135 Mg Cpdr (Choline Fenofibrate) .... Take 1 Capsule By Mouth Once Daily...  Allergies: 1)  ! Penicillin V Potassium (Penicillin V Potassium) 2)  ! Septra (Sulfamethoxazole-Trimethoprim) 3)  ! Sulfa 4)  ! * Iron Tablets 5)  Lopid (Gemfibrozil) 6)  Tricor (Fenofibrate) 7)  Antara (Fenofibrate Micronized) 8)  Niaspan (Niacin (Antihyperlipidemic))   Vital Signs:  Patient profile:   77 year old female Height:      61 inches Weight:      151 pounds BMI:     28.63 BP sitting:   122 / 72 Cuff size:   regular  Impression & Recommendations:  Problem # 1:  HYPERLIPIDEMIA, MIXED (ICD-272.2) Pt's current cholesterol is TC: 223 (goal<200), TG- 252 (goal<150), HDL- 35.7 (goal>45), and LDL-133 (goal<100).  AST and ALT are WNL.  Pt's LDL has increased since last visit.  Her TG have decreased since pt switched to Trilipix from generic fenofibrate.  The biggest difference since last visit is pt's diet.  She has been eating a lot of fried foods and high carbohydrate meals.  Will continue current medications since pt was controlled with this therapy in April but will encourage her to limit eating out to only 1 night a week at the most and increase her exercise.  Will f/u in 2 months.   Her updated medication list for this problem includes:    Lipitor 40 Mg Tabs (Atorvastatin calcium) .Marland Kitchen... Take 1 tab by mouth at bedtime...    Trilipix 135 Mg Cpdr (Choline fenofibrate) .Marland Kitchen... Take 1 capsule by mouth once daily...  Patient Instructions: 1)  Continue Lipitor 40mg  daily, Trilipix, fish oil, and CoQ 10  2)  Try to take your dinner with you to work and only eat out 1 night a week  3)  Increase exercise to 30 min of walking at the mall most days of the week  4)  Recheck labs: 11/12/09 at [facility] office  (fasting) 5)  Lipid Clinic Appt: 11/19/09 at 2 pm

## 2010-03-14 NOTE — Assessment & Plan Note (Signed)
Summary: rov/apc   CC:  17 month ROV & review of mult medical problems....  History of Present Illness: 77 y/o WF here for a follow up visit... he has multiple medical problems as noted below...    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 she has no new complaints or concerns today, but she requests a switch back to Lipitor from the Zocor...   ~  February 28, 2009:  she has been working w/ the Lipid Clinic and Chol numbers are OK, but TG still elevated >> she was given samples of Trilipix & liked these capsules but filled Rx for generic fenofibrate pills and didn't like this...  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    Current Problem List:  ALLERGIC RHINITIS (ICD-477.9) - she uses FLONASE 2sp in each nostril Qhs + OTC Zyrtek etc...  BRONCHITIS, RECURRENT (ICD-491.9) - on FLOVENT 44- 2 sp Bid w/ spacer but only using Prn & ave 2/wk...  ESSENTIAL HYPERTENSION (ICD-401.9) - on ASA 81mg /d,  ATENOLOL 50mg /d,  AMLODIPINE 5mg /d,  DIOVAN/Hct 160-12.5 daily... BP= 122/64 and feeling well... denies HA, fatigue, visual changes, CP, palipit, dizziness, syncope, dyspnea, edema, etc...  HYPERLIPIDEMIA, MIXED (ICD-272.2) - on LIPITOR 40mg /d now per Lipid Clinc, & they have been working on her TG's... added FishOil 4gm/d, & prev INTOL to mult Fibrate meds but tried Trilipix capsules & liked these, therefore Rx wriiten for the non-generic TRILIPIX 135mg /d...  ~  labs 9/08 on diet alone showed TChol 279, TG 598, HDL 36, LDL 107... rec Zocor40, CoQ10...  ~  FLP 8/09 on Simva40 showed TChol 355, TG 1604, HDL 22, LDL 48... refer to LipidClinic.  ~  FLP 1/10 showed TChol 171, TG 395, HDL 34, LDL 68... not taking Trilipix due to $$$.  ~  FLP 7/10 showed TChol 182, TG 491, HDL 34, LDL 60... not taking  Fenofib due to "reaction"  ~  FLP 1/11 showed =    GERD (ICD-530.81) - controlled on Zantac75 as needed  DIVERTICULOSIS OF COLON (ICD-562.10) IRRITABLE BOWEL SYNDROME (ICD-564.1) HEMORRHOIDS, INTERNAL (ICD-455.0) COLONIC POLYPS, ADENOMATOUS (ICD-211.3) - last colonoscopy = 6/07 DrStark +adenomatous polyp/ divertics/ int hem; f/u 5 yrs.  DEGENERATIVE JOINT DISEASE (ICD-715.90) - spurs seen on prev spine films... she takes OSCAL Bid & MVI...  ANXIETY (ICD-300.00)    Allergies: 1)  ! Penicillin V Potassium (Penicillin V Potassium) 2)  ! Septra (Sulfamethoxazole-Trimethoprim) 3)  ! Sulfa 4)  ! * Iron Tablets 5)  Lopid (Gemfibrozil) 6)  Tricor (Fenofibrate) 7)  Antara (Fenofibrate Micronized) 8)  Niaspan (Niacin (Antihyperlipidemic))  Comments:  Nurse/Medical Assistant: The patient's medications and allergies were reviewed with the patient and were updated in the Medication and Allergy Lists.  Past History:  Past Medical History:  ALLERGIC RHINITIS (ICD-477.9) BRONCHITIS, RECURRENT (ICD-491.9) ESSENTIAL HYPERTENSION (ICD-401.9) HYPERLIPIDEMIA, MIXED (ICD-272.2) GERD (ICD-530.81) DIVERTICULOSIS OF COLON (ICD-562.10) IRRITABLE BOWEL SYNDROME (ICD-564.1) COLONIC POLYPS, ADENOMATOUS (ICD-211.3) HEMORRHOIDS, INTERNAL (ICD-455.0) DEGENERATIVE JOINT DISEASE (ICD-715.90) ANXIETY (ICD-300.00)  Past Surgical History: Hysterectomy - 1966 for metrorrhagia & fibroids  Family History: Father died age 67 from MI, HBP, & alcoholism... Mother died age 56 from heart disease... 2 Siblings: Brother died age 33 from Leukemia... Sister died age 33 w/ a congenital heart prob only dx at autopsy.  Social  History: Widow, husb died 66... 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  Review of Systems  The patient denies fever, chills, sweats, anorexia, fatigue, weakness, malaise, weight loss, sleep disorder, blurring, diplopia,  eye irritation, eye discharge, vision loss, eye pain, photophobia, earache, ear discharge, tinnitus, decreased hearing, nasal congestion, nosebleeds, sore throat, hoarseness, chest pain, palpitations, syncope, dyspnea on exertion, orthopnea, PND, peripheral edema, cough, dyspnea at rest, excessive sputum, hemoptysis, wheezing, pleurisy, nausea, vomiting, diarrhea, constipation, change in bowel habits, abdominal pain, melena, hematochezia, jaundice, gas/bloating, indigestion/heartburn, dysphagia, odynophagia, dysuria, hematuria, urinary frequency, urinary hesitancy, nocturia, incontinence, back pain, joint pain, joint swelling, muscle cramps, muscle weakness, stiffness, arthritis, sciatica, restless legs, leg pain at night, leg pain with exertion, rash, itching, dryness, suspicious lesions, paralysis, paresthesias, seizures, tremors, vertigo, transient blindness, frequent falls, frequent headaches, difficulty walking, depression, anxiety, memory loss, confusion, cold intolerance, heat intolerance, polydipsia, polyphagia, polyuria, unusual weight change, abnormal bruising, bleeding, enlarged lymph nodes, urticaria, allergic rash, hay fever, and recurrent infections.    Vital Signs:  Patient profile:   77 year old female Height:      61 inches Weight:      153.50 pounds BMI:     29.11 O2 Sat:      96 % on Room air Temp:     98.5 degrees F oral Pulse rate:   79 / minute BP sitting:   122 / 64  (right arm) Cuff size:   regular  Vitals Entered By: Randell Loop CMA (February 28, 2009 2:32 PM)  O2 Sat at Rest %:  96 O2 Flow:  Room air CC: 17 month ROV & review of mult medical problems... Is Patient Diabetic? No Pain Assessment Patient in pain? no      Comments meds updated today   Physical Exam  Additional Exam:  WD, WN, 77 y/o WF in NAD... GENERAL:  Alert & oriented; pleasant & cooperative... HEENT:  [place]/AT, Glasses, EOM-wnl,  EACs-clear, TMs-wnl, NOSE-pale w/ clear drainage.  THROAT-clear &  wnl. NECK:  Supple w/ fairROM; no JVD; normal carotid impulses w/o bruits; no thyromegaly or nodules palpated; no lymphadenopathy. CHEST:  Clear- no wheezes, rales, or rhonchi heard... HEART:  Regular Rhythm; without murmurs/ rubs/ or gallops detected... ABDOMEN:  Soft & nontender; normal bowel sounds; no organomegaly or masses palapted... EXT: without deformities, mild arthritic changes; no varicose veins/ venous insuffic/ or edema. NEURO:  CN's intact;  no focal neuro deficits... DERM:  no lesions noted...      MISC. Report  Procedure date:  02/28/2009  Findings:      She will return to our lab for FASTING blood work 03/05/09...  SN   Impression & Recommendations:  Problem # 1:  ALLERGIC RHINITIS (ICD-477.9) She requests refill of the Flonase etc... Her updated medication list for this problem includes:    Flonase 50 Mcg/act Susp (Fluticasone propionate) .Marland Kitchen... 2 sprays in each nostril as needed  Problem # 2:  BRONCHITIS, RECURRENT (ICD-491.9) We discussed using the FLOVENT 44 regularly...  Problem # 3:  ESSENTIAL HYPERTENSION (ICD-401.9) Controlled on meds-  continue same. Her updated medication list for this problem includes:    Atenolol 50 Mg Tabs (Atenolol) .Marland Kitchen... 1 tab daily    Amlodipine Besylate 5 Mg Tabs (Amlodipine besylate) .Marland Kitchen... 1 tab daily    Diovan Hct 160-12.5 Mg Tabs (Valsartan-hydrochlorothiazide) .Marland Kitchen... 1 tab daily  Problem # 4:  HYPERLIPIDEMIA, MIXED (ICD-272.2) Followed in the Lipid clinic-  she would like to  try the Trilipix again... samples given to last to her next [facility] appt. Her updated medication list for this problem includes:    Lipitor 40 Mg Tabs (Atorvastatin calcium) .Marland Kitchen... Take 1 tab by mouth at bedtime...    Trilipix 135 Mg Cpdr (Choline fenofibrate) .Marland Kitchen... Take 1 capsule by mouth once daily...  Problem # 5:  GERD (ICD-530.81) Stable on the Zantac for Prn use... Her updated medication list for this problem includes:    Zantac 75 75 Mg Tabs  (Ranitidine hcl) .Marland Kitchen... Take 1 tab by mouth two times a day as needed...  Problem # 6:  COLONIC POLYPS, ADENOMATOUS (ICD-211.3) F/u colon due 2012 per DrStark...  Problem # 7:  DEGENERATIVE JOINT DISEASE (ICD-715.90) Stable w/ OTC meds... Her updated medication list for this problem includes:    Aspirin 81 Mg Tbec (Aspirin) .Marland Kitchen... 1 tab daily    Tylenol Extra Strength 500 Mg Tabs (Acetaminophen) .Marland Kitchen... Take 1-2 tablets every 4-6 hours as needed  Problem # 8:  OTHER MEDICAL PROBLEMS AS NOTED>>>  Complete Medication List: 1)  Flonase 50 Mcg/act Susp (Fluticasone propionate) .... 2 sprays in each nostril as needed 2)  Flovent Hfa 44 Mcg/act Aero (Fluticasone propionate  hfa) .... Inhale 2 puffs two times a day 3)  Aspirin 81 Mg Tbec (Aspirin) .Marland Kitchen.. 1 tab daily 4)  Atenolol 50 Mg Tabs (Atenolol) .Marland Kitchen.. 1 tab daily 5)  Amlodipine Besylate 5 Mg Tabs (Amlodipine besylate) .Marland Kitchen.. 1 tab daily 6)  Diovan Hct 160-12.5 Mg Tabs (Valsartan-hydrochlorothiazide) .Marland Kitchen.. 1 tab daily 7)  Lipitor 40 Mg Tabs (Atorvastatin calcium) .... Take 1 tab by mouth at bedtime.Marland KitchenMarland Kitchen 8)  Eql Fish Oil 1000 Mg Caps (Omega-3 fatty acids) .... Take 4  daily 9)  Co Q-10 50 Mg Caps (Coenzyme q10) .Marland Kitchen.. 1 daily 10)  Zantac 75 75 Mg Tabs (Ranitidine hcl) .... Take 1 tab by mouth two times a day as needed... 11)  Oscal 500/200 D-3 500-200 Mg-unit Tabs (Calcium-vitamin d) .... Take 1 tab by mouth two times a day... 12)  Tylenol Extra Strength 500 Mg Tabs (Acetaminophen) .... Take 1-2 tablets every 4-6 hours as needed 13)  Trilipix 135 Mg Cpdr (Choline fenofibrate) .... Take 1 capsule by mouth once daily...  Other Orders: Prescription Created Electronically [phone])  Patient Instructions: 1)  Today we updated your med list- see below.... 2)  We refilled your meds as requested... 3)  Please return here 03/05/09 for your FASTING blood work... please call the "phone tree" in a few days for your lab results.Marland KitchenMarland Kitchen 4)  Continue your follow up by the  Lipid Clinic.Marland KitchenMarland Kitchen 5)  Call for any problems.Marland KitchenMarland Kitchen 6)  Please schedule a follow-up appointment in 1 year, sooner as needed. Prescriptions: TRICOR 145 MG TABS (FENOFIBRATE) take 1 tab by mouth once daily...  #90 x prn   Entered and Authorized by:   Michele Mcalpine MD   Signed by:   Michele Mcalpine MD on 02/28/2009   Method used:   Print then Give to Patient   RxID:   [id number] LIPITOR 40 MG  TABS (ATORVASTATIN CALCIUM) take 1 tab by mouth at bedtime...  #90 x prn   Entered and Authorized by:   Michele Mcalpine MD   Signed by:   Michele Mcalpine MD on 02/28/2009   Method used:   Print then Give to Patient   RxID:   [id number] DIOVAN HCT 160-12.5 MG  TABS (VALSARTAN-HYDROCHLOROTHIAZIDE) 1 tab daily  #90 x prn   Entered and Authorized  by:   Michele Mcalpine MD   Signed by:   Michele Mcalpine MD on 02/28/2009   Method used:   Print then Give to Patient   RxID:   [id number] AMLODIPINE BESYLATE 5 MG  TABS (AMLODIPINE BESYLATE) 1 tab daily  #90 x prn   Entered and Authorized by:   Michele Mcalpine MD   Signed by:   Michele Mcalpine MD on 02/28/2009   Method used:   Print then Give to Patient   RxID:   [id number] ATENOLOL 50 MG  TABS (ATENOLOL) 1 tab daily  #90 x prn   Entered and Authorized by:   Michele Mcalpine MD   Signed by:   Michele Mcalpine MD on 02/28/2009   Method used:   Print then Give to Patient   RxID:   [id number] FLOVENT HFA 44 MCG/ACT  AERO (FLUTICASONE PROPIONATE  HFA) inhale 2 puffs two times a day  #1 x prn   Entered and Authorized by:   Michele Mcalpine MD   Signed by:   Michele Mcalpine MD on 02/28/2009   Method used:   Print then Give to Patient   RxID:   [id number] FLONASE 50 MCG/ACT  SUSP (FLUTICASONE PROPIONATE) 2 sprays in each nostril as needed  #1 x prn   Entered and Authorized by:   Michele Mcalpine MD   Signed by:   Michele Mcalpine MD on 02/28/2009   Method used:   Print then Give to Patient   RxID:   [id number]

## 2010-03-14 NOTE — Letter (Signed)
Summary: [facility]   Imported By: Lennie Odor 10/25/2009 15:31:27  _____________________________________________________________________  External Attachment:    Type:   Image     Comment:   External Document

## 2010-03-14 NOTE — Assessment & Plan Note (Signed)
Summary: flu shot/jd   Nurse Visit   Allergies: 1)  ! Penicillin V Potassium (Penicillin V Potassium) 2)  ! Septra 3)  ! Sulfa 4)  ! * Iron Tablets 5)  Lopid (Gemfibrozil) 6)  Tricor (Fenofibrate) 7)  Antara (Fenofibrate Micronized) 8)  Niaspan (Niacin (Antihyperlipidemic))  Orders Added: 1)  Flu Vaccine 55yrs + MEDICARE PATIENTS [Q2039] 2)  Administration Flu vaccine - MCR [G0008] Flu Vaccine Consent Questions     Do you have a history of severe allergic reactions to this vaccine? no    Any prior history of allergic reactions to egg and/or gelatin? no    Do you have a sensitivity to the preservative Thimersol? no    Do you have a past history of Guillan-Barre Syndrome? no    Do you currently have an acute febrile illness? no    Have you ever had a severe reaction to latex? no    Vaccine information given and explained to patient? yes    Are you currently pregnant? no    Lot Number:[id number]   Exp Date:08/10/2010   Site Given  Right Deltoid IMmedflu

## 2010-03-20 ENCOUNTER — Other Ambulatory Visit: Payer: Medicare Other

## 2010-03-20 ENCOUNTER — Other Ambulatory Visit: Payer: Self-pay | Admitting: Pulmonary Disease

## 2010-03-20 ENCOUNTER — Encounter (INDEPENDENT_AMBULATORY_CARE_PROVIDER_SITE_OTHER): Payer: Self-pay | Admitting: *Deleted

## 2010-03-20 DIAGNOSIS — E7800 Pure hypercholesterolemia, unspecified: Secondary | ICD-10-CM

## 2010-03-20 DIAGNOSIS — Z79899 Other long term (current) drug therapy: Secondary | ICD-10-CM

## 2010-03-20 DIAGNOSIS — E785 Hyperlipidemia, unspecified: Secondary | ICD-10-CM

## 2010-03-20 LAB — LIPID PANEL
Cholesterol: 183 mg/dL (ref 0–200)
Total CHOL/HDL Ratio: 5
Triglycerides: 459 mg/dL — ABNORMAL HIGH (ref 0.0–149.0)
VLDL: 91.8 mg/dL — ABNORMAL HIGH (ref 0.0–40.0)

## 2010-03-20 LAB — LDL CHOLESTEROL, DIRECT: Direct LDL: 68.9 mg/dL

## 2010-03-20 LAB — HEPATIC FUNCTION PANEL: Albumin: 3.5 g/dL (ref 3.5–5.2)

## 2010-06-25 NOTE — Assessment & Plan Note (Signed)
[facility]                               LIPID CLINIC NOTE   RAKSHA, WOLFGANG                      MRN:          [id number]  DATE:03/16/2008                            DOB:          [birth year]    Return office visit for Lipid Clinic.   PAST MEDICAL HISTORY:  1. Hyperlipidemia.  2. Hypertension.  3. Gastroesophageal reflux disease.  4. Irritable bowel disease.   MEDICATIONS:  1. Diovan 160 mg daily.  2. HCTZ 12.5 mg daily.  3. Amlodipine 5 mg daily.  4. Aspirin 81 mg daily.  5. Atenolol 50 mg daily.  6. Os-Cal plus D daily.  7. Coenzyme Q10 50 mg daily.  8. Fish oil 1 g twice daily.  9. Lipitor 40 mg daily.   MEDICATION INTOLERANCES:  NIASPAN caused GI upset in the past.   PHYSICAL EXAMINATION:  VITAL SIGNS:  Weight 152 pounds, blood pressure  128/78, and heart rate 80.   LABORATORY DATA:  Total cholesterol 171, triglyceride 395, HDL 34, and  LDL 68.  LFTs within normal limits.  Fasting glucose 108, BUN 13, and  creatinine 0.9.   ASSESSMENT:  Ms. Vanderheiden is a very pleasant 77 year old woman who is very  active.  She is very active around her house.  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  She helps to feed them dinner.  She  walks with them in the evening times, about 8 o'clock she leads a  walking group around the premises, inside as well as out.  She is a very  sweet wonderful woman who has done her best to change her eating habit.  She has decreased her fats in her diet tremendously and she decreases  her snacks.  She has increased the amount of lean protein and vegetables  in her diet.  She does occasionally eats some nut crackers as well as  drinking half Ensure with her medications in the late afternoon.  She  had previously not been exercising, but she has now and as stated before  has encouraged some other residents of her assisted  living facility to  exercise along with her.  She does say that over the last few months,  given the holiday, she has increased the amount of food that she is  eating and also decreased the amount of exercise, but she has planned to  restart her lifestyle modification has done previously after the snow  clears this weekends.  She is compliant with her medications.  She is  not having any adverse events notably from those.  Her total cholesterol  is at goal of less than 200, triglycerides greater than goal of less  than 150, HDL less than goal of greater than 40.  However, improved from  last visit.  LDL is at goal of less than 100, most notably has decreased  to less than 70, since last visit.  Her non HDL is slightly above goal  of less than 130.  We have  had many discussion about the benefits of low-  fat, low-carbohydrate diet and the importance of exercise as they can  state that she is willing to restart this on Monday.  We also have  discussed her hypertriglyceridemia that seems to be continued, however,  improved from baseline of being 1600.   PLAN:  1. To give her a samples of Trilipix, if she tolerates these, a      prescription of generic TriCor has been entered for her to pick up      at her  pharmacy.  2. Continue Lipitor 40 mg daily.  3. Continue fish oil daily.  4. Continue low-fat, low-carbohydrate diet, and increase her exercise.  5. Follow up visit in 3 months for lipid panel and LFTs and make      adjustments from that time.      Leota Sauers, PharmD  Electronically Signed      Jesse Sans. Daleen Squibb, MD, [facility]  Electronically Signed   LC/MedQ  DD: 03/16/2008  DT: 03/17/2008  Job #: [id number]

## 2010-06-25 NOTE — Assessment & Plan Note (Signed)
[facility]                               LIPID CLINIC NOTE   [name], Weaver                      MRN:          [id number]  DATE:12/23/2007                            DOB:          04/20/1930    Return office visit for lipid clinic.   PAST MEDICAL HISTORY:  1. Hyperlipidemia.  2. Hypertension.  3. Gastroesophageal reflux disease.  4. Irritable bowel syndrome.   MEDICATION:  1. Diovan 160 mg daily.  2. Hydrochlorothiazide 12.5 mg daily.  3. Amlodipine 5 mg daily.  4. Aspirin 81 mg daily.  5. Atenolol 50 mg daily.  6. Os-Cal plus D daily.  7. Coenzyme Q10 50 mg daily.  8. Fish oil 1 g twice daily.  9. Lipitor 40 mg daily.   LABORATORY DATA:  LFTs within normal limits.  Total cholesterol 188,  triglyceride 419, HDL 33, LDL 78.   VITAL SIGNS:  Weight 148 pounds, blood pressure 148/80, and heart rate  80.   ASSESSMENT:  Rhonda Weaver is a very, very pleasant 77, almost 77 year old  woman, who returns to lipid clinic today with no chest pain, no  shortness of breath, no muscle aches or pains.  She is compliant with  current medication regimen.  She states that she has made changes to her  diet to decrease her carbohydrates and her fat.  We also spoke of other  options of increasing some fruits and vegetables in her daily meals and  with her snacks and decreasing some other carbohydrates especially her  Nab crackers that she eats throughout the day.  She is willing to make  these changes.  She does occasional exercise, but not on a daily basis.  I have encouraged her to make a conscious effort of twice a week to walk  outside for 20 minutes.  She states that she does walk a lot at work.  She rarely is sedentary when she is at home.  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  She said that they bring lots  of joy to her life as well as she  bringing lots of joy to theirs.  Her  total cholesterol is at goal of less than 200.  Her triglycerides are  greater than goal less than 150.  However, significantly improved from  1600 at last visit, down to 419.  Her HDL is less than goal of greater  than 40.  However, significantly improved from 22 to 33.  Since last  visit, her LDL is at goal of less than 100, however, I will encourage a  goal of less than 70 if possible.   PLAN:  1. To increase fish oil to 4 g a day, two in the morning and two in      the evening.  Continue Lipitor 40 mg daily.  2. Increase exercise.  3. Decrease carbohydrates in diet, increasing fruits and vegetables.  4. Followup visit in 3 months for lipid  panel and LFTs, then we will      make adjustments at that time.      Rhonda Weaver, PharmD  Electronically Signed      Rhonda Weaver. Rhonda Squibb, MD, [facility]  Electronically Signed   LC/MedQ  DD: 12/23/2007  DT: 12/24/2007  Job #: [id number]

## 2010-06-25 NOTE — Assessment & Plan Note (Signed)
[facility]                             PULMONARY OFFICE NOTE   Rhonda Weaver, Rhonda Weaver                      MRN:          [id number]  DATE:09/14/2006                            DOB:          [birth year]    HISTORY OF PRESENT ILLNESS:  Patient is a 77 year old white female  patient of Dr. Kriste Basque who has a known history of asthmatic bronchitis,  hypertension, hyperlipidemia, who presents for an acute office visit.  Patient complains that over the last two months, she has had some nasal  congestion, postnasal drip, and feels that her tongue is coated.  Patient denies any purulent sputum, chest pain, fever, orthopnea, PND,  or leg swelling.   PAST MEDICAL HISTORY:  Reviewed.   CURRENT MEDICATIONS:  Reviewed.   PHYSICAL EXAMINATION:  Patient is a pleasant female in no acute  distress.  She is afebrile with stable vital signs.  HEENT:  Nasal mucosa is pale.  Nontender sinuses.  Conjunctivae are  noninjected.  TM's are normal.  Posterior pharynx is with some mild  erythema.  No exudate.  Tongue is smooth without any exudate noted.  NECK:  Supple without adenopathy.  No JVD.  LUNGS:  The lung sounds are clear.  CARDIAC:  Regular rate.  ABDOMEN:  Soft and nontender.  EXTREMITIES:  Warm without any edema.   IMPRESSION/PLAN:  Acute rhinitis flare:  Patient is to use saline nasal  spray p.r.n.  May use Zyrtec 10 mg at bedtime as needed.  Magic  mouthwash as needed for sore throat.  Swish and swallow up to 4 times a  day.  Patient may also use Mucinex as needed for nasal congestion and is  to use Nasonex nasal spray 2 puffs twice daily.  Patient is to return  back with Dr. Kriste Basque as scheduled or sooner if needed.      Rubye Oaks, NP  Electronically Signed      Lonzo Cloud. Kriste Basque, MD  Electronically Signed   TP/MedQ  DD: 09/14/2006  DT: 09/15/2006  Job #: [id number]

## 2010-06-25 NOTE — Assessment & Plan Note (Signed)
[facility]                               LIPID CLINIC NOTE   Rhonda, Weaver                      MRN:          [id number]  DATE:10/14/2007                            DOB:          [birth year]    REFERRING PHYSICIAN:  Lonzo Cloud. Kriste Basque, MD   PAST MEDICAL HISTORY:  1. Hyperlipidemia.  2. Hypertension.  3. Gastroesophageal reflux disease.  4. Irritable bowel syndrome.   MEDICATIONS:  1. Diovan 160 mg daily.  2. Hydrochlorothiazide 12.5 mg daily.  3. Amlodipine 5 mg daily.  4. Aspirin 81 mg daily.  5. Atenolol 50 mg daily.  6. Os-Cal plus vitamin D daily.  7. Coenzyme Q10 50 mg daily.   VITAL SIGNS:  Weight 146 pounds, blood pressure 150/80, and heart rate  80.   LABORATORY DATA:  Total cholesterol 355, triglycerides 1604, HDL 22, and  LDL 48.  LFTs within normal limits.  CBG was 111.   ASSESSMENT:  Rhonda Weaver is a very pleasant 77 year old female who comes to  Lipid Clinic today referred by her primary physician, Dr. Kriste Basque for  familial hyperlipidemia, specifically hypertriglyceridemia.  Her mother  passed away of an myocardial infarction at 75 years old, but had history  of hyperlipidemia.  Her father passed away at age 47 from what is  thought to be cerebrovascular accident.  He had a history of  hyperlipidemia, alcohol abuse, and tobacco abuse.  She has been  compliant with her medications except for her statin therapy that she  had been started on Lipitor several years ago; however, she became  noncompliant with that.  Because of cost, she was then started on  simvastatin 40, but it caused GI upset.  She also states that she  believes that she tried TriCor in the past, but had GI upset with that  as well.  She recently was supposed to restart her Lipitor 40 mg daily;  however, she has not done that yet.  She walks at work and in her yard  doing yard work prior to working.  She works at an [employer] 4 days a week from 3 to  34.  She does cook for herself during the  afternoons, but mostly has cereals for breakfast, [employer] evening  meals, and would like snacking at work, and has a main meal around lunch  time of usually some type of low-fat protein, small amount of  carbohydrate, and a vegetable.  She is very willing to implement  lifestyle modification.  We talked about small changes in her diet,  especially when it comes to her snacking of low-carbohydrate alternative  and specifically adding in more fruits and vegetables and fiber into her  daily food intake.  She also is very willing to 2 days a week to begin  walking 15-30 minutes at a time.  Hopefully, if we get her into some  type of routine we will be able to increase the time and the amount of  her exercise regimen as time goes on.  Her total cholesterol is greater  than goal of less  than 200, triglycerides are greater than goal of less  than 150, HDL less than goal of greater than 40, and LDL goal of less  than 70.   PLAN:  1. To restart her Lipitor 40 mg daily.  2. Begin fish oil tablets 1 g once daily for 1 week, increase to twice      daily, and then ultimately being on fish oil 1 g 3 times daily over      the next 3-4 months and we will reevaluate her lipid panel at that      time.  She is agreeable to retry TriCor in the future; however, she      wants to make some lifestyle modifications at this time.  3. Begin exercise regimen.  4. Decrease carbohydrates in diet.  5. Follow up visit in 3-4 months for lipid panel and LFTs and we will      make adjustments at that time.      Rhonda Weaver, PharmD  Electronically Signed      Jesse Sans. Daleen Squibb, MD, [facility]  Electronically Signed   LC/MedQ  DD: 10/14/2007  DT: 10/15/2007  Job #: [id number]

## 2010-07-16 ENCOUNTER — Encounter: Payer: Self-pay | Admitting: Gastroenterology

## 2010-07-30 ENCOUNTER — Encounter: Payer: Self-pay | Admitting: Gastroenterology

## 2010-08-20 ENCOUNTER — Other Ambulatory Visit: Payer: Self-pay | Admitting: Internal Medicine

## 2010-08-21 ENCOUNTER — Telehealth: Payer: Self-pay | Admitting: Pulmonary Disease

## 2010-08-21 NOTE — Telephone Encounter (Signed)
Spoke with the pt and she states that the Lopid has caused her leg cramps and she is requesting to get a prescription for Trilipix 135 mgs daily. She states she has some of this left over from the lipid clinic and has been taking this and feels well on it. Please advise if ok to send rx for this. Carron Curie, CMA

## 2010-08-22 MED ORDER — CHOLINE FENOFIBRATE 135 MG PO CPDR: 135.0000 mg | DELAYED_RELEASE_CAPSULE | Freq: Every day | ORAL | Status: DC

## 2010-08-22 NOTE — Telephone Encounter (Signed)
Spoke with pt and notified of recs per TP. She verbalized understanding, but is refusing to go to the lipid clinic b/c at last ov with SN, he advised her she did not have to go back there. I explained that in this case, we may need to wait for SN to address this msg, b/c TP feels needs lipid clinic f/u. Pt fine with this. Will forward to Leigh's basket so SN can address when he returns.

## 2010-08-22 NOTE — Telephone Encounter (Signed)
Per TP, fine if she does not want to see lipid clinic, but will need f/u here. Spoke with pt and informed her that I have sent her med to pharmacy and scheduled her to see SN fasting for labs on 09/25/10.

## 2010-08-22 NOTE — Telephone Encounter (Signed)
That is fine to have rx.  Trilipix 135mg  #60 , 1 po daily , 5 refills  Previously stopped trilipix due to cost and ? Leg pains  She needs to follow up with lipid clinic -once she restarts needs ov with lipid clinic in 6 weeks  Please set up this ov up for her .  Please contact office for sooner follow up if symptoms do not improve or worsen or seek emergency care

## 2010-09-10 ENCOUNTER — Ambulatory Visit (AMBULATORY_SURGERY_CENTER): Payer: Medicare Other

## 2010-09-10 VITALS — Ht 62.0 in | Wt 146.4 lb

## 2010-09-10 DIAGNOSIS — Z86010 Personal history of colonic polyps: Secondary | ICD-10-CM

## 2010-09-10 MED ORDER — PEG-KCL-NACL-NASULF-NA ASC-C 100 G PO SOLR: 1.0000 | Freq: Once | ORAL | Status: AC

## 2010-09-24 ENCOUNTER — Other Ambulatory Visit: Payer: Medicare Other | Admitting: Gastroenterology

## 2010-09-25 ENCOUNTER — Ambulatory Visit: Payer: Medicare Other | Admitting: Pulmonary Disease

## 2010-11-11 ENCOUNTER — Ambulatory Visit (INDEPENDENT_AMBULATORY_CARE_PROVIDER_SITE_OTHER): Payer: Medicare Other

## 2010-11-11 DIAGNOSIS — Z23 Encounter for immunization: Secondary | ICD-10-CM

## 2011-02-25 DIAGNOSIS — H2510 Age-related nuclear cataract, unspecified eye: Secondary | ICD-10-CM | POA: Diagnosis not present

## 2011-02-25 DIAGNOSIS — IMO0002 Reserved for concepts with insufficient information to code with codable children: Secondary | ICD-10-CM | POA: Diagnosis not present

## 2011-03-14 ENCOUNTER — Other Ambulatory Visit: Payer: Self-pay | Admitting: Pulmonary Disease

## 2011-03-18 DIAGNOSIS — R509 Fever, unspecified: Secondary | ICD-10-CM | POA: Diagnosis not present

## 2011-03-18 DIAGNOSIS — K591 Functional diarrhea: Secondary | ICD-10-CM | POA: Diagnosis not present

## 2011-03-31 ENCOUNTER — Other Ambulatory Visit: Payer: Self-pay | Admitting: Pulmonary Disease

## 2011-03-31 MED ORDER — AMLODIPINE BESYLATE 5 MG PO TABS: 5.0000 mg | ORAL_TABLET | Freq: Every day | ORAL | Status: DC

## 2011-04-11 ENCOUNTER — Ambulatory Visit: Payer: Medicare Other | Admitting: Pulmonary Disease

## 2011-04-14 ENCOUNTER — Ambulatory Visit: Payer: Medicare Other | Admitting: Pulmonary Disease

## 2011-04-17 ENCOUNTER — Other Ambulatory Visit: Payer: Self-pay | Admitting: Pulmonary Disease

## 2011-07-08 ENCOUNTER — Other Ambulatory Visit: Payer: Self-pay | Admitting: Pulmonary Disease

## 2011-08-13 DIAGNOSIS — N3000 Acute cystitis without hematuria: Secondary | ICD-10-CM | POA: Diagnosis not present

## 2011-10-30 ENCOUNTER — Encounter: Payer: Self-pay | Admitting: Gastroenterology

## 2011-11-14 DIAGNOSIS — L820 Inflamed seborrheic keratosis: Secondary | ICD-10-CM | POA: Diagnosis not present

## 2011-12-10 ENCOUNTER — Ambulatory Visit: Payer: Medicare Other | Admitting: Pulmonary Disease

## 2011-12-30 ENCOUNTER — Other Ambulatory Visit: Payer: Self-pay | Admitting: Pulmonary Disease

## 2011-12-30 ENCOUNTER — Telehealth: Payer: Self-pay | Admitting: Pulmonary Disease

## 2011-12-30 DIAGNOSIS — E782 Mixed hyperlipidemia: Secondary | ICD-10-CM

## 2011-12-30 DIAGNOSIS — D126 Benign neoplasm of colon, unspecified: Secondary | ICD-10-CM

## 2011-12-30 DIAGNOSIS — E559 Vitamin D deficiency, unspecified: Secondary | ICD-10-CM

## 2011-12-30 DIAGNOSIS — F411 Generalized anxiety disorder: Secondary | ICD-10-CM

## 2011-12-30 DIAGNOSIS — K5730 Diverticulosis of large intestine without perforation or abscess without bleeding: Secondary | ICD-10-CM

## 2011-12-30 DIAGNOSIS — I10 Essential (primary) hypertension: Secondary | ICD-10-CM

## 2011-12-30 NOTE — Telephone Encounter (Signed)
Called and spoke with pt and she is aware of labs in the computer for her.  Nothing further is needed.

## 2011-12-30 NOTE — Telephone Encounter (Signed)
Pt last OV 02/27/10 f/u 1 year. Has OV 01/06/12. Please advise thanks

## 2011-12-31 ENCOUNTER — Other Ambulatory Visit (INDEPENDENT_AMBULATORY_CARE_PROVIDER_SITE_OTHER): Payer: Medicare Other

## 2011-12-31 DIAGNOSIS — I10 Essential (primary) hypertension: Secondary | ICD-10-CM | POA: Diagnosis not present

## 2011-12-31 DIAGNOSIS — E559 Vitamin D deficiency, unspecified: Secondary | ICD-10-CM | POA: Diagnosis not present

## 2011-12-31 DIAGNOSIS — F411 Generalized anxiety disorder: Secondary | ICD-10-CM | POA: Diagnosis not present

## 2011-12-31 DIAGNOSIS — K5730 Diverticulosis of large intestine without perforation or abscess without bleeding: Secondary | ICD-10-CM

## 2011-12-31 DIAGNOSIS — E782 Mixed hyperlipidemia: Secondary | ICD-10-CM | POA: Diagnosis not present

## 2011-12-31 DIAGNOSIS — E785 Hyperlipidemia, unspecified: Secondary | ICD-10-CM | POA: Diagnosis not present

## 2011-12-31 LAB — LIPID PANEL: VLDL: 94 mg/dL — ABNORMAL HIGH (ref 0.0–40.0)

## 2011-12-31 LAB — BASIC METABOLIC PANEL
BUN: 15 mg/dL (ref 6–23)
CO2: 33 mEq/L — ABNORMAL HIGH (ref 19–32)
Chloride: 100 mEq/L (ref 96–112)
Potassium: 4.5 mEq/L (ref 3.5–5.1)

## 2011-12-31 LAB — HEPATIC FUNCTION PANEL
ALT: 15 U/L (ref 0–35)
AST: 21 U/L (ref 0–37)
Total Bilirubin: 0.7 mg/dL (ref 0.3–1.2)
Total Protein: 7.7 g/dL (ref 6.0–8.3)

## 2011-12-31 LAB — TSH: TSH: 2.08 u[IU]/mL (ref 0.35–5.50)

## 2011-12-31 LAB — CBC WITH DIFFERENTIAL/PLATELET
Eosinophils Absolute: 0.3 10*3/uL (ref 0.0–0.7)
Eosinophils Relative: 3.6 % (ref 0.0–5.0)
MCHC: 33.5 g/dL (ref 30.0–36.0)
MCV: 94.8 fl (ref 78.0–100.0)
Monocytes Absolute: 0.8 10*3/uL (ref 0.1–1.0)
Neutrophils Relative %: 37.7 % — ABNORMAL LOW (ref 43.0–77.0)
Platelets: 255 10*3/uL (ref 150.0–400.0)
WBC: 7.1 10*3/uL (ref 4.5–10.5)

## 2012-01-01 LAB — VITAMIN D 25 HYDROXY (VIT D DEFICIENCY, FRACTURES): Vit D, 25-Hydroxy: 31 ng/mL (ref 30–89)

## 2012-01-05 ENCOUNTER — Other Ambulatory Visit: Payer: Self-pay | Admitting: Pulmonary Disease

## 2012-01-06 ENCOUNTER — Ambulatory Visit: Payer: Medicare Other | Admitting: Pulmonary Disease

## 2012-02-12 ENCOUNTER — Encounter: Payer: Self-pay | Admitting: Pulmonary Disease

## 2012-02-12 ENCOUNTER — Ambulatory Visit (INDEPENDENT_AMBULATORY_CARE_PROVIDER_SITE_OTHER): Payer: Medicare Other | Admitting: Pulmonary Disease

## 2012-02-12 VITALS — BP 140/78 | HR 62 | Temp 97.5°F | Ht 61.0 in | Wt 138.2 lb

## 2012-02-12 DIAGNOSIS — J309 Allergic rhinitis, unspecified: Secondary | ICD-10-CM

## 2012-02-12 DIAGNOSIS — K219 Gastro-esophageal reflux disease without esophagitis: Secondary | ICD-10-CM

## 2012-02-12 DIAGNOSIS — I10 Essential (primary) hypertension: Secondary | ICD-10-CM | POA: Diagnosis not present

## 2012-02-12 DIAGNOSIS — K589 Irritable bowel syndrome without diarrhea: Secondary | ICD-10-CM

## 2012-02-12 DIAGNOSIS — F411 Generalized anxiety disorder: Secondary | ICD-10-CM

## 2012-02-12 DIAGNOSIS — K5730 Diverticulosis of large intestine without perforation or abscess without bleeding: Secondary | ICD-10-CM

## 2012-02-12 DIAGNOSIS — D126 Benign neoplasm of colon, unspecified: Secondary | ICD-10-CM

## 2012-02-12 DIAGNOSIS — E782 Mixed hyperlipidemia: Secondary | ICD-10-CM

## 2012-02-12 DIAGNOSIS — M1990 Unspecified osteoarthritis, unspecified site: Secondary | ICD-10-CM

## 2012-02-12 MED ORDER — ATORVASTATIN CALCIUM 20 MG PO TABS: 20.0000 mg | ORAL_TABLET | Freq: Every day | ORAL | Status: DC

## 2012-02-12 MED ORDER — VALSARTAN-HYDROCHLOROTHIAZIDE 160-12.5 MG PO TABS: 1.0000 | ORAL_TABLET | Freq: Every day | ORAL | Status: DC

## 2012-02-12 MED ORDER — FLUTICASONE PROPIONATE 50 MCG/ACT NA SUSP: 2.0000 | Freq: Every day | NASAL | Status: DC

## 2012-02-12 MED ORDER — ATENOLOL 50 MG PO TABS: 50.0000 mg | ORAL_TABLET | Freq: Every day | ORAL | Status: DC

## 2012-02-12 MED ORDER — AMLODIPINE BESYLATE 5 MG PO TABS: 5.0000 mg | ORAL_TABLET | Freq: Every day | ORAL | Status: DC

## 2012-02-12 MED ORDER — FENOFIBRATE 160 MG PO TABS: 160.0000 mg | ORAL_TABLET | Freq: Every day | ORAL | Status: DC

## 2012-02-12 MED ORDER — FLUTICASONE PROPIONATE HFA 44 MCG/ACT IN AERO: 2.0000 | INHALATION_SPRAY | Freq: Two times a day (BID) | RESPIRATORY_TRACT | Status: DC

## 2012-02-12 NOTE — Patient Instructions (Addendum)
Today we updated your med list in our EPIC system...    Continue your current medications the same...  We decided to restart treatment for your LIPIDS>    Lipitor (Atorvastatin) 20mg  - one tab daily...    Fenofibrate 160mg  (generic to save $$) - one tab daily...  Call for any questions or problems...  Let's plan follow up FASTING blood work in about 3 months ~the 1st week of April 2014 and we will call you w/ the results.Marland KitchenMarland Kitchen

## 2012-02-14 ENCOUNTER — Encounter: Payer: Self-pay | Admitting: Pulmonary Disease

## 2012-02-14 NOTE — Progress Notes (Signed)
Subjective:     Patient ID: Rhonda Weaver, female   DOB: 1930-09-13, 79 y.o.   MRN: [id number]  HPI 79 y/o WF here for a follow up visit... he has multiple medical problems as noted below...   ~  February 28, 2009:  she has been working w/ the Lipid Clinic and Chol numbers are OK, but TG still elevated >> she was given samples of Trilipix & liked these capsules but filled Rx for generic fenofibrate pills and didn't like this...  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  ~  February 27, 2010:  she's had a good yr- no new complaints or concerns... she wants 90d refills & will return for fasting labs...  Breathing stable on Prn Flonase & Flovent;  BP controlled on her 3 meds below;  she is working w/ the Lipid Clinic but stopped the Trilipix due to leg pain & her TG's were >600 in Oct... she saw DrMacDiarmid 9/11 for urinary incontinence & overactive bladder> ?on [place] & Vesicare w/ f/u pending...  ~  February 12, 2012:  44yr ROV & Rhonda Weaver reports a good interval, doing well, w/o new complaints or concerns;  She had bilat Cat surg 11/12 & 1/13 by DrLyles; no other signif problems;  We reviewed the following medical problems during today's office visit >>    AR, AB> on Flonase & [phone] for prn use; rarely needs these she says "but they help if my nose gets stuffy"...    HBP> on Aten50, Amlod5, DiovanHCT 160-12.5; BP= 140/78 & she denies CP, palpit, SOB, edema, etc....    Hyperlipid> she stopped both Lip20 & Feno135 on her own; she quit the Lipid clinic as well; FLP 11/13 showed TChol 267, TG 470, HDL 35, LDL 114    GI- GERD, Divertics, IBS, Polyps, Hems> she denies abd pain, dysphagia, n/v, c/d, blood seen...    GU- OB> rec to try OB meds but she declined; "I have a rupture in the back of my vagina" & encouraged to f/u w/ Gyn/ urology Annabell Howells, MacDiarmid)...    DJD> she uses OTC analgesics- Tylenol,  aleve, etc; asked to take vitD supplement daily at 1-2000u    Anxiety> not on anxiolytics, good coping skills...    Derm> eval by DrLomax & benign lesion removed... We reviewed prob list, meds, xrays and labs> see below for updates >>  LABS 11/13:  FLP- not at goals on diet alone;  Chems- BS=115, otherw ok;  CBC- wnl;  TSH=2.08;  VitD=31         Problem List:  ALLERGIC RHINITIS (ICD-477.9) - she uses FLONASE 2sp in each nostril Qhs + OTC Zyrtek etc...  BRONCHITIS, RECURRENT (ICD-491.9) - on FLOVENT 44- 2 sp Bid w/ spacer but only using Prn & ave 2/wk... ~  CXR 8/09 showed normal heart size, clear lungs, multilevel osteophytes & degen changes...  ESSENTIAL HYPERTENSION (ICD-401.9) - on ASA 81mg /d,  ATENOLOL 50mg /d,  AMLODIPINE 5mg /d,  DIOVAN/Hct 160-12.5 daily...  ~  1/12: BP= 122/74 and feeling well... denies HA, fatigue, visual changes, CP, palipit, dizziness, syncope, dyspnea, edema, etc... ~  1/14: on Aten50, Amlod5, DiovanHCT 160-12.5; BP= 140/78 & she denies CP, palpit, SOB, edema, etc....   HYPERLIPIDEMIA, MIXED (ICD-272.2) - on LIPITOR 40mg /d per Lipid Clinc, & they have been working on her TG's... added FishOil 4gm/d, but INTOL to mult Fibrate meds including Trilipix which caused leg discomfort... ~  labs 9/08 on diet alone showed TChol 279, TG 598, HDL 36, LDL 107... rec Zocor40, CoQ10... ~  FLP 8/09 on Simva40 showed TChol 355, TG 1604, HDL 22, LDL 48... refer to LipidClinic. ~  FLP 1/10 showed TChol 171, TG 395, HDL 34, LDL 68... not taking Trilipix due to $$$. ~  FLP 7/10 showed TChol 182, TG 491, HDL 34, LDL 60... not taking Fenofib due to "reaction" ~  FLP 1/11 showed TChol 165, TG 213, HDL 38, LDL 89 ~  serial FLPs followed by Lipid Clinic & their notes are reviewed... ~  FLP 11/13 on diet alone showed TChol 267, TG 470, HDL 35, LDL 114   GERD (ICD-530.81) - controlled on Prilosec20 vs Zantac75 as needed  DIVERTICULOSIS OF COLON (ICD-562.10) IRRITABLE BOWEL SYNDROME  (ICD-564.1) HEMORRHOIDS, INTERNAL (ICD-455.0) COLONIC POLYPS, ADENOMATOUS (ICD-211.3) - last colonoscopy = 6/07 DrStark +adenomatous polyp/ divertics/ int hem; f/u 5 yrs.  DEGENERATIVE JOINT DISEASE (ICD-715.90) - spurs seen on prev spine films... she takes Tylenol, OSCAL Bid, & MVI.  ANXIETY (ICD-300.00)   Past Medical History  Diagnosis Date  . Hyperlipidemia   . Hypertension   . Allergy   . Cataract   . GERD (gastroesophageal reflux disease)     Past Surgical History  Procedure Date  . Vaginal hysterectomy   . Polypectomy   . Colonoscopy   . Dental surgery     Outpatient Encounter Prescriptions as of 02/12/2012  Medication Sig Dispense Refill  . acetaminophen (TYLENOL) 500 MG tablet Take 500 mg by mouth every 6 (six) hours as needed.        Marland Kitchen amLODipine (NORVASC) 5 MG tablet Take 1 tablet (5 mg total) by mouth daily.  30 tablet  11  . aspirin 81 MG EC tablet Take 81 mg by mouth daily.        Marland Kitchen atenolol (TENORMIN) 50 MG tablet Take 1 tablet (50 mg total) by mouth daily.  30 tablet  11  . Calcium Carbonate (CALTRATE 600 PO) Take by mouth daily.        . fluticasone (FLONASE) 50 MCG/ACT nasal spray Place 2 sprays into the nose daily.  16 g  11  . fluticasone (FLOVENT HFA) 44 MCG/ACT inhaler Inhale 2 puffs into the lungs 2 (two) times daily.  11 g  11  . valsartan-hydrochlorothiazide (DIOVAN HCT) 160-12.5 MG per tablet Take 1 tablet by mouth daily.  30 tablet  11  . [DISCONTINUED] amLODipine (NORVASC) 5 MG tablet Take 1 tablet (5 mg total) by mouth daily.  90 tablet  0  . [DISCONTINUED] atenolol (TENORMIN) 50 MG tablet TAKE ONE TABLET BY MOUTH EVERY DAY  90 tablet  3  . [DISCONTINUED] DIOVAN HCT 160-12.5 MG per tablet TAKE ONE TABLET BY MOUTH EVERY DAY  90 each  3  . [DISCONTINUED] FLOVENT HFA 44 MCG/ACT inhaler INHALE TWO PUFFS BY MOUTH TWICE DAILY  11 g  11  . [DISCONTINUED] fluticasone (FLONASE) 50 MCG/ACT nasal spray USE TWO SPRAY EACH NOSTRIL EVERY DAY AS NEEDED  16 g  11    . atorvastatin (LIPITOR) 20 MG tablet Take 1 tablet (20 mg total) by mouth daily.  30 tablet  11  . fenofibrate 160 MG tablet Take 1 tablet (160 mg total) by mouth daily.  30 tablet  11  . [DISCONTINUED] amLODipine (NORVASC) 5 MG tablet TAKE ONE TABLET BY MOUTH EVERY DAY  30 tablet  2  . [DISCONTINUED] atorvastatin (LIPITOR) 20 MG tablet Take 20 mg by mouth daily.        . [  DISCONTINUED] Choline Fenofibrate (TRILIPIX) 135 MG capsule Take 1 capsule (135 mg total) by mouth daily.  30 capsule  5  . [DISCONTINUED] fish oil-omega-3 fatty acids 1000 MG capsule Take 1 g by mouth 2 (two) times daily.          Allergies  Allergen Reactions  . Fenofibrate     REACTION: pt states intolerant  . Gemfibrozil     REACTION: pt states intolerant  . Niacin     REACTION: pt states intolerant  . Penicillins     REACTION: Hx of arm swelling \\T \ itching after shot  . Sulfamethoxazole W-Trimethoprim     REACTION: "deathly sick"  w/  N\T\V  . Sulfonamide Derivatives     REACTION: nausea and vomiting    Current Medications, Allergies, Past Medical History, Past Surgical History, Family History, and Social History were reviewed in [employer] record.   Review of Systems         See HPI - all other systems neg except as noted... The patient denies anorexia, fever, weight loss, weight gain, vision loss, decreased hearing, hoarseness, chest pain, syncope, dyspnea on exertion, peripheral edema, prolonged cough, headaches, hemoptysis, abdominal pain, melena, hematochezia, severe indigestion/heartburn, hematuria, incontinence, muscle weakness, suspicious skin lesions, transient blindness, difficulty walking, depression, unusual weight change, abnormal bleeding, enlarged lymph nodes, and angioedema.     Objective:   Physical Exam     WD, WN, 79 y/o WF in NAD... GENERAL:  Alert & oriented; pleasant & cooperative... HEENT:   [place]/AT, Glasses, EOM-wnl,  EACs-clear, TMs-wnl, NOSE-pale w/ clear  drainage.  THROAT-clear & wnl. NECK:  Supple w/ fairROM; no JVD; normal carotid impulses w/o bruits; no thyromegaly or nodules palpated; no lymphadenopathy. CHEST:  Clear- no wheezes, rales, or rhonchi heard... HEART:  Regular Rhythm; without murmurs/ rubs/ or gallops detected... ABDOMEN:  Soft & nontender; normal bowel sounds; no organomegaly or masses palapted... EXT: without deformities, mild arthritic changes; no varicose veins/ venous insuffic/ or edema. NEURO:  CN's intact;  no focal neuro deficits... DERM:  no lesions noted...  RADIOLOGY DATA:  Reviewed in the EPIC EMR & discussed w/ the patient...  LABORATORY DATA:  Reviewed in the EPIC EMR & discussed w/ the patient...   Assessment:      AR, AB>  Stable on prn meds; she's been able to avoid infections & doing satis...  HBP>  Controlled on same , continue sa is...  Hyperlipid>  Not controlled on diet alone & she is agreeable to re-try Lip20 + Feno160 w/ f/u labs in 26mo...  Gi- GERD, Divertics, IBS, Polyps, Hems> stable & relatively asymptomatic; due for f/u colon & asked to call DrStark...  DJD>  She uses OTC analgesics as needed...  Anxiety>  Stable & not requiring anxiolytic meds...     Plan:     Patient's Medications  New Prescriptions   FENOFIBRATE 160 MG TABLET    Take 1 tablet (160 mg total) by mouth daily.  Previous Medications   ACETAMINOPHEN (TYLENOL) 500 MG TABLET    Take 500 mg by mouth every 6 (six) hours as needed.     ASPIRIN 81 MG EC TABLET    Take 81 mg by mouth daily.     CALCIUM CARBONATE (CALTRATE 600 PO)    Take by mouth daily.    Modified Medications   Modified Medication Previous Medication   AMLODIPINE (NORVASC) 5 MG TABLET amLODipine (NORVASC) 5 MG tablet      Take 1 tablet (5 mg total)  by mouth daily.    Take 1 tablet (5 mg total) by mouth daily.   ATENOLOL (TENORMIN) 50 MG TABLET atenolol (TENORMIN) 50 MG tablet      Take 1 tablet (50 mg total) by mouth daily.    TAKE ONE TABLET BY  MOUTH EVERY DAY   ATORVASTATIN (LIPITOR) 20 MG TABLET atorvastatin (LIPITOR) 20 MG tablet      Take 1 tablet (20 mg total) by mouth daily.    Take 20 mg by mouth daily.     FLUTICASONE (FLONASE) 50 MCG/ACT NASAL SPRAY fluticasone (FLONASE) 50 MCG/ACT nasal spray      Place 2 sprays into the nose daily.    USE TWO SPRAY EACH NOSTRIL EVERY DAY AS NEEDED   FLUTICASONE (FLOVENT HFA) 44 MCG/ACT INHALER FLOVENT HFA 44 MCG/ACT inhaler      Inhale 2 puffs into the lungs 2 (two) times daily.    INHALE TWO PUFFS BY MOUTH TWICE DAILY   VALSARTAN-HYDROCHLOROTHIAZIDE (DIOVAN HCT) 160-12.5 MG PER TABLET DIOVAN HCT 160-12.5 MG per tablet      Take 1 tablet by mouth daily.    TAKE ONE TABLET BY MOUTH EVERY DAY  Discontinued Medications   AMLODIPINE (NORVASC) 5 MG TABLET    TAKE ONE TABLET BY MOUTH EVERY DAY   CHOLINE FENOFIBRATE (TRILIPIX) 135 MG CAPSULE    Take 1 capsule (135 mg total) by mouth daily.   FISH OIL-OMEGA-3 FATTY ACIDS 1000 MG CAPSULE    Take 1 g by mouth 2 (two) times daily.

## 2012-05-12 ENCOUNTER — Ambulatory Visit: Payer: Medicare Other | Admitting: Pulmonary Disease

## 2012-08-27 DIAGNOSIS — N390 Urinary tract infection, site not specified: Secondary | ICD-10-CM | POA: Diagnosis not present

## 2012-08-27 DIAGNOSIS — N3090 Cystitis, unspecified without hematuria: Secondary | ICD-10-CM | POA: Diagnosis not present

## 2012-11-27 DIAGNOSIS — Z23 Encounter for immunization: Secondary | ICD-10-CM | POA: Diagnosis not present

## 2012-11-27 DIAGNOSIS — J069 Acute upper respiratory infection, unspecified: Secondary | ICD-10-CM | POA: Diagnosis not present

## 2012-12-23 ENCOUNTER — Other Ambulatory Visit: Payer: Self-pay | Admitting: Pulmonary Disease

## 2013-02-09 ENCOUNTER — Other Ambulatory Visit: Payer: Self-pay | Admitting: Pulmonary Disease

## 2013-02-13 ENCOUNTER — Other Ambulatory Visit: Payer: Self-pay | Admitting: Pulmonary Disease

## 2013-02-14 ENCOUNTER — Other Ambulatory Visit: Payer: Self-pay | Admitting: Pulmonary Disease

## 2013-02-15 NOTE — Telephone Encounter (Signed)
Flovent HFA rx was sent to [facility] on 02/14/13.   Called [facility], was advised they did receive this rx and medication is ready.  Nothing further is needed.

## 2013-03-08 ENCOUNTER — Telehealth: Payer: Self-pay | Admitting: Pulmonary Disease

## 2013-03-08 DIAGNOSIS — E782 Mixed hyperlipidemia: Secondary | ICD-10-CM

## 2013-03-08 DIAGNOSIS — I10 Essential (primary) hypertension: Secondary | ICD-10-CM

## 2013-03-08 DIAGNOSIS — F411 Generalized anxiety disorder: Secondary | ICD-10-CM

## 2013-03-08 NOTE — Telephone Encounter (Signed)
Labs have been ordered. Pt is aware.

## 2013-03-09 ENCOUNTER — Other Ambulatory Visit: Payer: Self-pay | Admitting: Pulmonary Disease

## 2013-03-11 ENCOUNTER — Other Ambulatory Visit (INDEPENDENT_AMBULATORY_CARE_PROVIDER_SITE_OTHER): Payer: Medicare Other

## 2013-03-11 DIAGNOSIS — I10 Essential (primary) hypertension: Secondary | ICD-10-CM

## 2013-03-11 DIAGNOSIS — F411 Generalized anxiety disorder: Secondary | ICD-10-CM | POA: Diagnosis not present

## 2013-03-11 DIAGNOSIS — E782 Mixed hyperlipidemia: Secondary | ICD-10-CM | POA: Diagnosis not present

## 2013-03-11 LAB — BASIC METABOLIC PANEL
BUN: 13 mg/dL (ref 6–23)
CHLORIDE: 102 meq/L (ref 96–112)
CO2: 32 mEq/L (ref 19–32)
CREATININE: 0.9 mg/dL (ref 0.4–1.2)
Calcium: 9.3 mg/dL (ref 8.4–10.5)
GFR: 65.68 mL/min (ref >60.00)
Glucose, Bld: 105 mg/dL — ABNORMAL HIGH (ref 70–99)
Potassium: 3.9 mEq/L (ref 3.5–5.1)
Sodium: 140 mEq/L (ref 135–145)

## 2013-03-11 LAB — CBC WITH DIFFERENTIAL/PLATELET
BASOS PCT: 0.9 % (ref 0.0–3.0)
Basophils Absolute: 0.1 10*3/uL (ref 0.0–0.1)
EOS PCT: 3.1 % (ref 0.0–5.0)
Eosinophils Absolute: 0.2 10*3/uL (ref 0.0–0.7)
HCT: 41.9 % (ref 36.0–46.0)
Hemoglobin: 14 g/dL (ref 12.0–15.0)
Lymphs Abs: 3.8 10*3/uL (ref 0.7–4.0)
MCHC: 33.5 g/dL (ref 30.0–36.0)
MCV: 96.2 fl (ref 78.0–100.0)
MONO ABS: 0.6 10*3/uL (ref 0.1–1.0)
MONOS PCT: 9.4 % (ref 3.0–12.0)
NEUTROS PCT: 31 % — AB (ref 43.0–77.0)
Neutro Abs: 2.1 10*3/uL (ref 1.4–7.7)
Platelets: 257 10*3/uL (ref 150.0–400.0)
RBC: 4.35 Mil/uL (ref 3.87–5.11)
RDW: 13.4 % (ref 11.5–14.6)
WBC: 6.9 10*3/uL (ref 4.5–10.5)

## 2013-03-11 LAB — LIPID PANEL
CHOL/HDL RATIO: 7
CHOLESTEROL: 260 mg/dL — AB (ref 0–200)
HDL: 36.7 mg/dL — AB (ref >39.00)
TRIGLYCERIDES: 560 mg/dL — AB (ref 0.0–149.0)
VLDL: 112 mg/dL — AB (ref 0.0–40.0)

## 2013-03-11 LAB — HEPATIC FUNCTION PANEL
ALBUMIN: 3.5 g/dL (ref 3.5–5.2)
ALK PHOS: 44 U/L (ref 39–117)
ALT: 16 U/L (ref 0–35)
AST: 18 U/L (ref 0–37)
Bilirubin, Direct: 0 mg/dL (ref 0.0–0.3)
TOTAL PROTEIN: 6.9 g/dL (ref 6.0–8.3)
Total Bilirubin: 0.8 mg/dL (ref 0.3–1.2)

## 2013-03-11 LAB — LDL CHOLESTEROL, DIRECT: Direct LDL: 97.8 mg/dL

## 2013-03-11 LAB — TSH: TSH: 2.33 u[IU]/mL (ref 0.35–5.50)

## 2013-03-21 ENCOUNTER — Encounter: Payer: Self-pay | Admitting: Pulmonary Disease

## 2013-03-21 ENCOUNTER — Ambulatory Visit (INDEPENDENT_AMBULATORY_CARE_PROVIDER_SITE_OTHER)
Admission: RE | Admit: 2013-03-21 | Discharge: 2013-03-21 | Disposition: A | Discharge status: Home / Self Care | Payer: Medicare Other | Source: Ambulatory Visit | Attending: Pulmonary Disease | Admitting: Pulmonary Disease

## 2013-03-21 ENCOUNTER — Ambulatory Visit (INDEPENDENT_AMBULATORY_CARE_PROVIDER_SITE_OTHER): Payer: Medicare Other | Admitting: Pulmonary Disease

## 2013-03-21 ENCOUNTER — Encounter: Payer: Self-pay | Admitting: Gastroenterology

## 2013-03-21 VITALS — BP 140/60 | HR 71 | Temp 97.7°F | Ht 61.0 in | Wt 139.2 lb

## 2013-03-21 DIAGNOSIS — M1990 Unspecified osteoarthritis, unspecified site: Secondary | ICD-10-CM

## 2013-03-21 DIAGNOSIS — J42 Unspecified chronic bronchitis: Secondary | ICD-10-CM

## 2013-03-21 DIAGNOSIS — I10 Essential (primary) hypertension: Secondary | ICD-10-CM

## 2013-03-21 DIAGNOSIS — E782 Mixed hyperlipidemia: Secondary | ICD-10-CM

## 2013-03-21 DIAGNOSIS — K219 Gastro-esophageal reflux disease without esophagitis: Secondary | ICD-10-CM

## 2013-03-21 DIAGNOSIS — Z0000 Encounter for general adult medical examination without abnormal findings: Secondary | ICD-10-CM | POA: Diagnosis not present

## 2013-03-21 DIAGNOSIS — D126 Benign neoplasm of colon, unspecified: Secondary | ICD-10-CM

## 2013-03-21 DIAGNOSIS — F411 Generalized anxiety disorder: Secondary | ICD-10-CM

## 2013-03-21 DIAGNOSIS — K589 Irritable bowel syndrome without diarrhea: Secondary | ICD-10-CM

## 2013-03-21 DIAGNOSIS — K5730 Diverticulosis of large intestine without perforation or abscess without bleeding: Secondary | ICD-10-CM

## 2013-03-21 MED ORDER — VALSARTAN-HYDROCHLOROTHIAZIDE 160-12.5 MG PO TABS: ORAL_TABLET | ORAL | Status: DC

## 2013-03-21 MED ORDER — AMLODIPINE BESYLATE 5 MG PO TABS: ORAL_TABLET | ORAL | Status: DC

## 2013-03-21 MED ORDER — FLUTICASONE PROPIONATE HFA 44 MCG/ACT IN AERO: INHALATION_SPRAY | RESPIRATORY_TRACT | Status: DC

## 2013-03-21 MED ORDER — GEMFIBROZIL 600 MG PO TABS: 600.0000 mg | ORAL_TABLET | Freq: Two times a day (BID) | ORAL | Status: DC

## 2013-03-21 MED ORDER — FLUTICASONE PROPIONATE 50 MCG/ACT NA SUSP: 2.0000 | Freq: Every day | NASAL | Status: DC

## 2013-03-21 MED ORDER — ATENOLOL 50 MG PO TABS: ORAL_TABLET | ORAL | Status: DC

## 2013-03-21 NOTE — Progress Notes (Signed)
Subjective:     Patient ID: Rhonda Weaver, female   DOB: 09-18-1930, 80 y.o.   MRN: [id number]  HPI 80 y/o WF here for a follow up visit... he has multiple medical problems as noted below...   ~  February 28, 2009:  she has been working w/ the [facility] and Chol numbers are OK, but TG still elevated >> she was given samples of Trilipix & liked these capsules but filled Rx for generic fenofibrate pills and didn't like this...  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  ~  February 27, 2010:  she's had a good yr- no new complaints or concerns... she wants 90d refills & will return for fasting labs...  Breathing stable on Prn Flonase & Flovent;  BP controlled on her 3 meds below;  she is working w/ the [facility] but stopped the Trilipix due to leg pain & her TG's were >600 in Oct... she saw DrMacDiarmid 9/11 for urinary incontinence & overactive bladder> ?on [place] w/ f/u pending...  ~  February 12, 2012:  15yr Rhonda Weaver reports a good interval, doing well, w/o new complaints or concerns;  She had bilat Cat surg 11/12 & 1/13 by DrLyles; no other signif problems;  We reviewed the following medical problems during today's office visit >>    AR, AB> on Flonase & [phone] for prn use; rarely needs these she says "but they help if my nose gets stuffy"...    HBP> on Aten50, Amlod5, DiovanHCT 160-12.5; BP= 140/78 & she denies CP, palpit, SOB, edema, etc....    Hyperlipid> she stopped both [place] on her own; she quit the Lipid clinic as well; [place] 11/13 showed TChol 267, TG 470, HDL 35, LDL 114    GI- GERD, Divertics, IBS, Polyps, Hems> she denies abd pain, dysphagia, n/v, c/d, blood seen...    GU- OB> rec to try OB meds but she declined; "I have a rupture in the back of my vagina" & encouraged to f/u w/ Gyn/ urology Jeffie Pollock, MacDiarmid)...    DJD> she uses OTC analgesics- Tylenol,  aleve, etc; asked to take vitD supplement daily at 1-2000u    Anxiety> not on anxiolytics, good coping skills...    Derm> eval by DrLomax & benign lesion removed... 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    HBP> on Aten50, Amlod5, DiovanHCT 160-12.5; BP= 140/60 & she denies CP, palpit, SOB, edema, etc....    Hyperlipid> she stopped both [place] on her own; she quit the Lipid clinic as well & refuses Statin rx; FLP 1/15 showed TChol 260, TG 560, HDL 37, LDL 98... This is hi risk, she agrres to take GEMFIB 600mg Bid.    GI- GERD, Divertics, IBS, Polyps, Hems> she denies abd pain, dysphagia, n/v, c/d, blood seen...    GU- OB> rec to try OB meds but she declined; "I have a rupture in  the back of my vagina" & encouraged to f/u w/ Gyn/ urology Jeffie Pollock, MacDiarmid)...    DJD> she uses OTC analgesics- Tylenol, aleve, etc; asked to take vitD supplement daily at 1-2000u    Anxiety> not on anxiolytics, good coping skills...    Derm> eval by DrLomax & benign lesion removed... We reviewed prob list, meds, xrays and labs> see below for updates >>   CXR 2/15 showed borderline cardiomeg, clear lungs, NAD, mild DJD in TSpine...  EKG 2/15 showed SBrady, rate58, poor R prog V1-3 (?old AWMI) => check 2DEcho  LABS 1/15:  FLP- NOT AT GOALS;  Chems- ok w/ BS=105;  CBC- wnl;  TSH=2.33...  ADDENDUM>> 2DEcho done 04/21/13>> sl incr wall thickness c/w mild LVH, normal wall motion and EF=55-60%, mildMR, mild LA dil... REC> no salt, take meds everyday, monitor BP at home, call for any problems...           Problem List:  ALLERGIC RHINITIS (ICD-477.9) - she uses FLONASE 2sp in each nostril Qhs + OTC Zyrtek etc...  BRONCHITIS, RECURRENT (ICD-491.9) - on FLOVENT 44- 2 sp Bid w/ spacer but only using Prn & ave 2/wk... ~  CXR 8/09 showed normal heart size, clear lungs, multilevel osteophytes & degen changes... ~  CXR 2/15 showed borderline cardiomeg, clear lungs, NAD, mild DJD in TSpine...  ESSENTIAL HYPERTENSION (ICD-401.9) - on ASA 81mg /d,  ATENOLOL 50mg /d,  AMLODIPINE 5mg /d,  DIOVAN/Hct 160-12.5 daily...  ~  1/12: BP= 122/74 and feeling well... denies HA, fatigue, visual changes, CP, palipit, dizziness, syncope, dyspnea, edema, etc... ~  1/14: on Aten50, Amlod5, DiovanHCT 160-12.5; BP= 140/78 & she denies CP, palpit, SOB, edema, etc....  ~  2/15: on Aten50, Amlod5, DiovanHCT 160-12.5; BP= 140/60 & she denies CP, palpit, SOB, edema, etc... ~  EKG 2/15 showed SBrady, rate58, poor R prog V1-3 (?old AWMI) => check 2DEcho  ~  2DEcho done 04/21/13>> sl incr wall thickness c/w mild LVH, normal wall motion and EF=55-60%, mildMR, mild LA dil...  HYPERLIPIDEMIA, MIXED (ICD-272.2) - on LIPITOR 40mg /d per Lipid Clinc, & they have been working on her TG's... added FishOil 4gm/d, but INTOL to mult Fibrate meds including Trilipix which caused leg discomfort... ~  labs 9/08 on diet alone showed TChol 279, TG 598, HDL 36, LDL 107... rec Zocor40, CoQ10... ~  [place] 8/09 on Simva40 showed TChol 355, TG 1604, HDL 22, LDL 48... refer to [facility]. ~  FLP 1/10 showed TChol 171, TG 395, HDL 34, LDL 68... not taking Trilipix due to $$$. ~  FLP 7/10 showed TChol 182, TG 491, HDL 34, LDL 60... not taking Fenofib due to "reaction" ~  FLP 1/11 showed TChol 165, TG 213, HDL 38, LDL 89 ~  serial FLPs followed by [facility]id Clinic & their notes are reviewed... ~  [place] 11/13 on diet alone showed TChol 267, TG 470, HDL 35, LDL 114  ~  FLP 1/15 on diet alone showed TChol 260, TG 560, HDL 37, LDL 98... She refuses to stay on statin rx, try  GEMFIB 600mg Bid.  GERD (ICD-530.81) - controlled on Prilosec20 vs Zantac75 as needed  DIVERTICULOSIS OF COLON (ICD-562.10) IRRITABLE BOWEL SYNDROME (ICD-564.1) HEMORRHOIDS, INTERNAL (ICD-455.0) COLONIC POLYPS, ADENOMATOUS (ICD-211.3) - last colonoscopy = 6/07 DrStark +adenomatous polyp/ divertics/ int hem; f/u 5 yrs.  DEGENERATIVE JOINT DISEASE (ICD-715.90) - spurs seen on prev spine films... she takes Tylenol, OSCAL Bid, & MVI.  ANXIETY (ICD-300.00)   Past Medical History  Diagnosis Date  . Hyperlipidemia   . Hypertension   .  Allergy   . Cataract   . GERD (gastroesophageal reflux disease)     Past Surgical History  Procedure Laterality Date  . Vaginal hysterectomy    . Polypectomy    . Colonoscopy    . Dental surgery      Outpatient Encounter Prescriptions as of 03/21/2013  Medication Sig  . acetaminophen (TYLENOL) 500 MG tablet Take 500 mg by mouth every 6 (six) hours as needed.    Marland Kitchen amLODipine (NORVASC) 5 MG tablet TAKE ONE TABLET BY MOUTH EVERY DAY  . aspirin 81 MG EC tablet Take 81 mg by mouth daily.    Marland Kitchen atenolol (TENORMIN) 50 MG tablet TAKE ONE TABLET BY MOUTH EVERY DAY  . Calcium Carbonate (CALTRATE 600 PO) Take by mouth daily.    Marland Kitchen FLOVENT HFA 44 MCG/ACT inhaler INHALE TWO PUFFS INTO THE LUNGS TWICE DAILY  . fluticasone (FLONASE) 50 MCG/ACT nasal spray Place 2 sprays into the nose daily.  . valsartan-hydrochlorothiazide (DIOVAN-HCT) 160-12.5 MG per tablet TAKE ONE TABLET BY MOUTH EVERY DAY  . atorvastatin (LIPITOR) 20 MG tablet Take 1 tablet (20 mg total) by mouth daily.  . fenofibrate 160 MG tablet Take 1 tablet (160 mg total) by mouth daily.    Allergies  Allergen Reactions  . Fenofibrate     REACTION: pt states intolerant  . Gemfibrozil     REACTION: pt states intolerant  . Niacin     REACTION: pt states intolerant  . Penicillins     REACTION: Hx of arm swelling \\T \ itching after shot  . Sulfamethoxazole-Trimethoprim     REACTION: "deathly sick"  w/   N\T\V  . Sulfonamide Derivatives     REACTION: nausea and vomiting    Current Medications, Allergies, Past Medical History, Past Surgical History, Family History, and Social History were reviewed in [employer] record.   Review of Systems         See HPI - all other systems neg except as noted... The patient denies anorexia, fever, weight loss, weight gain, vision loss, decreased hearing, hoarseness, chest pain, syncope, dyspnea on exertion, peripheral edema, prolonged cough, headaches, hemoptysis, abdominal pain, melena, hematochezia, severe indigestion/heartburn, hematuria, incontinence, muscle weakness, suspicious skin lesions, transient blindness, difficulty walking, depression, unusual weight change, abnormal bleeding, enlarged lymph nodes, and angioedema.     Objective:   Physical Exam     WD, WN, 80 y/o WF in NAD... GENERAL:  Alert & oriented; pleasant & cooperative... HEENT:  [place]/AT, Glasses, EOM-wnl,  EACs-clear, TMs-wnl, NOSE-pale w/ clear drainage.  THROAT-clear & wnl. NECK:  Supple w/ fairROM; no JVD; normal carotid impulses w/o bruits; no thyromegaly or nodules palpated; no lymphadenopathy. CHEST:  Clear- no wheezes, rales, or rhonchi heard... HEART:  Regular Rhythm; without murmurs/ rubs/ or gallops detected... ABDOMEN:  Soft & nontender; normal bowel sounds; no organomegaly or masses palapted... EXT: without deformities, mild arthritic changes; no varicose veins/ venous insuffic/ or edema. NEURO:  CN's intact;  no focal neuro deficits... DERM:  no lesions noted...  RADIOLOGY DATA:  Reviewed in the EPIC EMR & discussed w/ the patient...  LABORATORY DATA:  Reviewed in the EPIC EMR & discussed w/ the patient...   Assessment:      AR, AB>  Stable on prn meds; she's been able to avoid infections & doing satis...  HBP>  Controlled on same 28meds, continue sa is...  Hyperlipid>  Not controlled on diet alone & she refuses to continue regular meds  eg- Lip & Feno; discussed again-  try GEMFIB600bid  Gi- GERD, Divertics, IBS, Polyps, Hems> stable & relatively asymptomatic; due for f/u colon & asked to call DrStark...  DJD>  She uses OTC analgesics as needed...  Anxiety>  Stable & not requiring anxiolytic meds...     Plan:     See med list..Marland Kitchen

## 2013-03-21 NOTE — Patient Instructions (Signed)
Today we updated your med list in our EPIC system...    Continue your current medications the same...    We refilled your meds per request...  We reviewed your recent FASTING blood work 7 decided to start LOPID (Gemfibrizol) 600mg  one tab TWICE daily for the Lipids...    We would like to recheck your Lipid Profile in 3 months... Mark your calendar for the 1st week or so in MAY...  Today we did your follow up CXR & EKG...    We will contact you w/ the results when available...   We will notify DrStark about your overdue colonoscopy...    Give his office a call at your convenience...  Let's get on track w/ a good low fat diet...  Call for any questions.Marland KitchenMarland Kitchen

## 2013-03-22 ENCOUNTER — Other Ambulatory Visit: Payer: Self-pay | Admitting: Pulmonary Disease

## 2013-03-22 DIAGNOSIS — I10 Essential (primary) hypertension: Secondary | ICD-10-CM

## 2013-04-14 ENCOUNTER — Other Ambulatory Visit (HOSPITAL_COMMUNITY): Payer: Medicare Other

## 2013-04-21 ENCOUNTER — Ambulatory Visit (HOSPITAL_COMMUNITY): Payer: Medicare Other | Attending: Cardiovascular Disease | Admitting: Radiology

## 2013-04-21 ENCOUNTER — Other Ambulatory Visit: Payer: Self-pay | Admitting: Pulmonary Disease

## 2013-04-21 ENCOUNTER — Other Ambulatory Visit: Payer: Self-pay

## 2013-04-21 DIAGNOSIS — I10 Essential (primary) hypertension: Secondary | ICD-10-CM | POA: Diagnosis not present

## 2013-04-21 DIAGNOSIS — E782 Mixed hyperlipidemia: Secondary | ICD-10-CM

## 2013-04-21 NOTE — Progress Notes (Signed)
Echocardiogram performed.  

## 2013-05-06 ENCOUNTER — Telehealth: Payer: Self-pay | Admitting: Pulmonary Disease

## 2013-05-06 NOTE — Telephone Encounter (Signed)
lmomtcb x1 

## 2013-05-06 NOTE — Telephone Encounter (Signed)
Called and spoke with pt and she is aware of echo results per SN.  Pt voiced her understanding and nothing further is needed.

## 2013-05-06 NOTE — Telephone Encounter (Signed)
Pt returned Leigh's call.

## 2013-05-19 ENCOUNTER — Encounter: Payer: Medicare Other | Admitting: Gastroenterology

## 2013-06-15 ENCOUNTER — Ambulatory Visit: Payer: Medicare Other | Admitting: Pulmonary Disease

## 2013-06-21 ENCOUNTER — Ambulatory Visit: Payer: Medicare Other | Admitting: Family Medicine

## 2013-07-01 ENCOUNTER — Encounter: Payer: Self-pay | Admitting: Family Medicine

## 2013-07-01 ENCOUNTER — Ambulatory Visit (INDEPENDENT_AMBULATORY_CARE_PROVIDER_SITE_OTHER): Payer: Medicare Other | Admitting: Family Medicine

## 2013-07-01 VITALS — BP 138/72 | HR 76 | Temp 98.4°F | Ht 61.0 in | Wt 135.0 lb

## 2013-07-01 DIAGNOSIS — E782 Mixed hyperlipidemia: Secondary | ICD-10-CM | POA: Diagnosis not present

## 2013-07-01 DIAGNOSIS — Z7689 Persons encountering health services in other specified circumstances: Secondary | ICD-10-CM

## 2013-07-01 DIAGNOSIS — Z7189 Other specified counseling: Secondary | ICD-10-CM

## 2013-07-01 DIAGNOSIS — K219 Gastro-esophageal reflux disease without esophagitis: Secondary | ICD-10-CM

## 2013-07-01 DIAGNOSIS — J309 Allergic rhinitis, unspecified: Secondary | ICD-10-CM

## 2013-07-01 DIAGNOSIS — I10 Essential (primary) hypertension: Secondary | ICD-10-CM | POA: Diagnosis not present

## 2013-07-01 DIAGNOSIS — Z23 Encounter for immunization: Secondary | ICD-10-CM

## 2013-07-01 MED ORDER — ATORVASTATIN CALCIUM 20 MG PO TABS: 20.0000 mg | ORAL_TABLET | Freq: Every day | ORAL | Status: DC

## 2013-07-01 MED ORDER — FLUTICASONE PROPIONATE 50 MCG/ACT NA SUSP: 2.0000 | Freq: Every day | NASAL | Status: DC

## 2013-07-01 MED ORDER — AMLODIPINE BESYLATE 5 MG PO TABS: ORAL_TABLET | ORAL | Status: DC

## 2013-07-01 MED ORDER — ATENOLOL 50 MG PO TABS: ORAL_TABLET | ORAL | Status: DC

## 2013-07-01 MED ORDER — VALSARTAN-HYDROCHLOROTHIAZIDE 160-12.5 MG PO TABS: ORAL_TABLET | ORAL | Status: DC

## 2013-07-01 MED ORDER — FLUTICASONE PROPIONATE HFA 44 MCG/ACT IN AERO: INHALATION_SPRAY | RESPIRATORY_TRACT | Status: DC

## 2013-07-01 MED ORDER — TETANUS-DIPHTH-ACELL PERTUSSIS 5-2.5-18.5 LF-MCG/0.5 IM SUSP: 0.5000 mL | Freq: Once | INTRAMUSCULAR | Status: DC

## 2013-07-01 NOTE — Addendum Note (Signed)
Addended by: Agnes Lawrence on: 07/01/2013 11:43 AM   Modules accepted: Orders

## 2013-07-01 NOTE — Patient Instructions (Signed)
-  PLEASE SIGN UP FOR MYCHART TODAY   We recommend the following healthy lifestyle measures: - eat a healthy diet consisting of lots of vegetables, fruits, beans, nuts, seeds, healthy meats such as white chicken and fish and whole grains.  - avoid fried foods, fast food, processed foods, sodas, red meet and other fattening foods.  - get a least 150 minutes of aerobic exercise per week.   Follow up in: 4-6 months for [place]

## 2013-07-01 NOTE — Progress Notes (Signed)
Pre visit review using our clinic review tool, if applicable. No additional management support is needed unless otherwise documented below in the visit note. 

## 2013-07-01 NOTE — Progress Notes (Addendum)
No chief complaint on file.   HPI:  Rhonda Weaver is here to establish care.  Last PCP and physical:  Has the following chronic problems and concerns today:  Patient Active Problem List   Diagnosis Date Noted  . ALLERGIC RHINITIS 09/28/2007  . DEGENERATIVE JOINT DISEASE 09/28/2007  . HYPERLIPIDEMIA, MIXED 10/15/2006  . ANXIETY 10/15/2006  . ESSENTIAL HYPERTENSION 10/15/2006  . BRONCHITIS, RECURRENT 10/15/2006  . GERD 10/15/2006   HTN: -meds include: norvasc 5mg , asa, atenolol 50, valsartan-hctz 160-12.5 -denies: CP, SOB, DOE, swelling, palpitations  HLD: -meds: atorvastatin -denies: cog changes, leg cramps  ROS: See pertinent positives and negatives per HPI.  Past Medical History  Diagnosis Date  . Hyperlipidemia   . Hypertension   . Allergy   . Cataract   . GERD (gastroesophageal reflux disease)   . HEMORRHOIDS, INTERNAL 10/15/2006    Qualifier: Diagnosis of  By: Lenna Gilford MD, [place], ADENOMATOUS 10/15/2006    Qualifier: Diagnosis of  By: Lenna Gilford MD, [place] 09/28/2007    Qualifier: Diagnosis of  By: Lenna Gilford MD, Deborra Medina   . Irritable bowel syndrome 10/15/2006    Qualifier: Diagnosis of  By: Lenna Gilford MD, [place] DISEASE 09/28/2007    Qualifier: Diagnosis of  By: Lenna Gilford MD, Deborra Medina     Family History  Problem Relation Age of Onset  . Heart disease Mother   . Kidney disease Father     History   Social History  . Marital Status: Widowed    Spouse Name: N/A    Number of Children: N/A  . Years of Education: N/A   Social History Main Topics  . Smoking status: Never Smoker   . Smokeless tobacco: Never Used  . Alcohol Use: No  . Drug Use: No  . Sexual Activity: None   Other Topics Concern  . 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  , Disp: , Rfl: ;  atenolol (TENORMIN) 50 MG tablet, TAKE ONE TABLET BY MOUTH EVERY DAY, Disp: 30 tablet, Rfl: 6;  Calcium Carbonate (CALTRATE 600 PO), Take by mouth daily.  , Disp: , Rfl:  valsartan-hydrochlorothiazide (DIOVAN-HCT) 160-12.5 MG per tablet, TAKE ONE TABLET BY MOUTH EVERY DAY, Disp: 30 tablet, Rfl: 6;  atorvastatin (LIPITOR) 20 MG tablet, Take 1 tablet (20 mg total) by mouth daily., Disp: 90 tablet, Rfl: 3;  fluticasone (FLONASE) 50 MCG/ACT nasal spray, Place 2 sprays into both nostrils daily., Disp: 16 g, Rfl: 11 fluticasone (FLOVENT HFA) 44 MCG/ACT inhaler, INHALE TWO PUFFS INTO THE LUNGS TWICE DAILY, Disp: 11 g, Rfl: 6  EXAM:  Filed Vitals:   07/01/13 1101  BP: 138/72  Pulse: 76  Temp: 98.4 F (36.9 C)    Body mass index is 25.52 kg/(m^2).  GENERAL: vitals reviewed and listed above, alert, oriented, appears well hydrated and in no acute distress  HEENT: atraumatic, conjunttiva clear, no obvious abnormalities on inspection of external nose and ears  NECK: no  obvious masses on inspection  LUNGS: clear to auscultation bilaterally, no wheezes, rales or rhonchi, good air movement  CV: HRRR, no peripheral edema  MS: moves all extremities without noticeable abnormality  PSYCH: pleasant and cooperative, no obvious depression or anxiety  ASSESSMENT AND PLAN:  Discussed the following assessment and plan:  HYPERLIPIDEMIA, MIXED  ESSENTIAL HYPERTENSION  ALLERGIC RHINITIS  GERD  Encounter to establish care -We reviewed the PMH, PSH, FH, SH, Meds and Allergies. -We provided refills for any medications we will prescribe as needed. -We addressed current concerns per orders and patient instructions. -We have asked for records for pertinent exams, studies, vaccines and notes from  previous providers. -We have advised patient to follow up per instructions below. -she wanted tdap today, given after discussion risks -she is unsure of some meds and was advised to call to update med list -follow up MEDICARE exam and fasting lasb  -Patient advised to return or notify a doctor immediately if symptoms worsen or persist or new concerns arise.  Patient Instructions   -PLEASE SIGN UP FOR MYCHART TODAY   We recommend the following healthy lifestyle measures: - eat a healthy diet consisting of lots of vegetables, fruits, beans, nuts, seeds, healthy meats such as white chicken and fish and whole grains.  - avoid fried foods, fast food, processed foods, sodas, red meet and other fattening foods.  - get a least 150 minutes of aerobic exercise per week.   Follow up in: 4-6 months for [place] R. Soriyah Osberg

## 2013-07-05 ENCOUNTER — Telehealth: Payer: Self-pay | Admitting: Family Medicine

## 2013-07-05 NOTE — Telephone Encounter (Signed)
Relevant patient education mailed to patient.  

## 2013-09-05 DIAGNOSIS — N390 Urinary tract infection, site not specified: Secondary | ICD-10-CM | POA: Diagnosis not present

## 2013-09-05 DIAGNOSIS — N3000 Acute cystitis without hematuria: Secondary | ICD-10-CM | POA: Diagnosis not present

## 2013-11-13 DIAGNOSIS — Z23 Encounter for immunization: Secondary | ICD-10-CM | POA: Diagnosis not present

## 2013-12-06 ENCOUNTER — Telehealth: Payer: Self-pay | Admitting: Family Medicine

## 2013-12-06 ENCOUNTER — Ambulatory Visit (INDEPENDENT_AMBULATORY_CARE_PROVIDER_SITE_OTHER): Payer: Medicare Other | Admitting: Family Medicine

## 2013-12-06 ENCOUNTER — Encounter: Payer: Self-pay | Admitting: Family Medicine

## 2013-12-06 ENCOUNTER — Other Ambulatory Visit: Payer: Self-pay

## 2013-12-06 VITALS — BP 130/74 | HR 65 | Temp 97.7°F | Ht 60.34 in | Wt 132.8 lb

## 2013-12-06 DIAGNOSIS — Z23 Encounter for immunization: Secondary | ICD-10-CM

## 2013-12-06 DIAGNOSIS — E785 Hyperlipidemia, unspecified: Secondary | ICD-10-CM | POA: Diagnosis not present

## 2013-12-06 DIAGNOSIS — Z1231 Encounter for screening mammogram for malignant neoplasm of breast: Secondary | ICD-10-CM

## 2013-12-06 DIAGNOSIS — Z0000 Encounter for general adult medical examination without abnormal findings: Secondary | ICD-10-CM | POA: Diagnosis not present

## 2013-12-06 NOTE — Progress Notes (Signed)
Pre visit review using our clinic review tool, if applicable. No additional management support is needed unless otherwise documented below in the visit note. 

## 2013-12-06 NOTE — Addendum Note (Signed)
Addended by: Agnes Lawrence on: 12/06/2013 09:09 AM   Modules accepted: Orders

## 2013-12-06 NOTE — Telephone Encounter (Signed)
Pt states she received a vaciine today, but it is not listed on her AVS. Pt would like to know what she had. pls call Ok to leave message

## 2013-12-06 NOTE — Progress Notes (Signed)
Medicare Annual Preventive Care Visit  (initial annual wellness or annual wellness exam) REports never had medicare exam before  Concerns and/or follow up today: none  ROS: negative for report of fevers, unintentional weight loss, vision changes, vision loss, hearing loss or change, chest pain, sob, hemoptysis, melena, hematochezia, hematuria, genital discharge or lesions, falls, bleeding or bruising, loc, thoughts of suicide or self harm, memory loss  1.) Patient-completed health risk assessment  - completed and reviewed, see scanned documentation  2.) Review of Medical History: -PMH, PSH, Family History and current specialty and care providers reviewed and updated and listed below  - see scanned in document in chart and below  Past Medical History  Diagnosis Date  . Hyperlipidemia   . Hypertension   . Allergy   . Cataract   . GERD (gastroesophageal reflux disease)   . HEMORRHOIDS, INTERNAL 10/15/2006    Qualifier: Diagnosis of  By: Lenna Gilford MD, [place], ADENOMATOUS 10/15/2006    Qualifier: Diagnosis of  By: Lenna Gilford MD, [place] 09/28/2007    Qualifier: Diagnosis of  By: Lenna Gilford MD, Deborra Medina   . Irritable bowel syndrome 10/15/2006    Qualifier: Diagnosis of  By: Lenna Gilford MD, [place] DISEASE 09/28/2007    Qualifier: Diagnosis of  By: Lenna Gilford MD, Deborra Medina     Past Surgical History  Procedure Laterality Date  . Vaginal hysterectomy    . Polypectomy    . Colonoscopy    . Dental surgery      History   Social History  . Marital Status: Widowed    Spouse Name: N/A    Number of Children: N/A  . Years of Education: N/A   Occupational History  . Not on file.   Social History Main Topics  . Smoking status: Never Smoker   . Smokeless tobacco: Never Used  . Alcohol Use: No  . Drug Use: No  . Sexual Activity: Not on file   Other Topics Concern  . 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 NO  History of falling? NO  Any trouble with IADLs - using a phone, using transportation, grocery shopping, preparing meals, doing housework, doing laundry, taking medications and managing money?  NO  Advance Directives? YES - DNR, living will, daughter has copy and she reports gave Dr. Lenna Gilford copy in past  See summary of recommendations in Patient Instructions below.  4.) Physical Exam Filed Vitals:   12/06/13 0818  BP: 130/74  Pulse: 65  Temp: 97.7 F (36.5 C)   Estimated body mass index is 25.63 kg/(m^2) as calculated from the following:   Height as of this encounter: 5' 0.34" (1.533 m).   Weight as of this encounter: 132 lb 12.8 oz (60.238 kg).  EKG (optional): deferred  General: alert, appear well hydrated and in no acute distress  HEENT: visual acuity grossly intact - mild far vision deficit - reports see opthomologist yearly  CV: HRRR  Lungs: CTA bilaterally  Psych: pleasant and cooperative,  no obvious depression or anxiety  Mini Cog: 1. Patient instructed to listen carefully and repeat the following: [place]  2. Clock drawing test was administered: NORMAL      3. Recall of three words 1/3  Patient Score: NEG    See patient instructions for recommendations.  Education and counseling regarding the above review of health provided with a plan for the following: -see scanned patient completed form for further details -fall prevention strategies discussed  -healthy lifestyle discussed -importance and resources for completing advanced directives discussed -see patient instructions below for any other recommendations provided  4)The following written screening schedule of preventive  measures were reviewed with assessment and plan made per below, orders and patient instructions:      AAA screening: N/A     Alcohol screening: done     Obesity Screening and counseling: done     STI screening: N/A     Tobacco Screening: done       Pneumococcal (PPSV23 -one dose after 64, one before if risk factors), influenza yearly and hepatitis B vaccines (if high risk - end stage renal disease, IV drugs, homosexual men, live in home for mentally retarded, hemophilia receiving factors) ASSESSMENT/PLAN: reports had pneumonia vaccine last year, wants prevnar 13      Screening mammograph (yearly if >40) ASSESSMENT/PLAN: reports has not had in several years      Screening Pap smear/pelvic exam (q2 years) ASSESSMENT/PLAN:n/a      Prostate cancer screening ASSESSMENT/PLAN:n/a      Colorectal cancer screening (FOBT yearly or flex sig q4y or colonoscopy q10y or barium enema q4y) ASSESSMENT/PLAN: reports done in 2010 and ok and she has chosen to not screen further      Diabetes outpatient self-management training services ASSESSMENT/PLAN: n/a      Bone mass measurements(covered q2y if indicated - estrogen def, osteoporosis, hyperparathyroid, vertebral abnormalities, osteoporosis or steroids) ASSESSMENT/PLAN: she delcined      Screening for glaucoma(q1y if high risk - diabetes, FH, AA and > 50 or hispanic and > 65) ASSESSMENT/PLAN: sees optho      Medical nutritional therapy for individuals with diabetes or renal disease ASSESSMENT/PLAN: n/a      Cardiovascular screening blood tests (lipids q5y) ASSESSMENT/PLAN: done 02/2013      Diabetes screening tests ASSESSMENT/PLAN: done 02/2013   7.) Summary: -risk factors and conditions per above assessment were discussed and treatment, recommendations and referrals were offered per documentation above and orders and patient instructions.  Medicare annual wellness visit, initial  Hyperlipemia - Plan: Lipid Panel  Patient Instructions   BEFORE YOU LEAVE: -prevnar 13 -shingles vaccine -labs  Schedule your mammogram  Please schedule eye exam with your opthomologist   Vit D3 1000 IU daily; calcium 1200mg  daily likely most of this comes from diet; weight bearing exercise to help your bones  -We recommend the following healthy lifestyle measures: - eat a healthy diet consisting of lots of vegetables, fruits, beans, nuts, seeds, healthy meats such as white chicken and fish and whole grains.  - avoid fried foods, fast food, processed foods, sodas, red meet and other fattening foods.  - get a least 150 minutes of aerobic exercise per week.

## 2013-12-06 NOTE — Telephone Encounter (Signed)
I called the pt and informed her she was given a Prevnar 13- and the paperwork about this injection.  She stated she remembers now and did not think to look at her paperwork.

## 2013-12-06 NOTE — Progress Notes (Signed)
Reports DNR and report Rhonda Weaver [phone]) her daughter has copy of living will

## 2013-12-06 NOTE — Patient Instructions (Addendum)
BEFORE YOU LEAVE: -prevnar 13 -shingles vaccine -labs -follow up 3 months  Schedule your mammogram  Please schedule eye exam with your opthomologist   Vit D3 1000 IU daily; calcium 1200mg  daily likely most of this comes from diet; weight bearing exercise to help your bones  -We recommend the following healthy lifestyle measures: - eat a healthy diet consisting of lots of vegetables, fruits, beans, nuts, seeds, healthy meats such as white chicken and fish and whole grains.  - avoid fried foods, fast food, processed foods, sodas, red meet and other fattening foods.  - get a least 150 minutes of aerobic exercise per week.

## 2013-12-07 ENCOUNTER — Ambulatory Visit: Payer: Medicare Other

## 2013-12-26 ENCOUNTER — Ambulatory Visit: Payer: Medicare Other

## 2014-01-01 DIAGNOSIS — N3000 Acute cystitis without hematuria: Secondary | ICD-10-CM | POA: Diagnosis not present

## 2014-01-01 DIAGNOSIS — N3090 Cystitis, unspecified without hematuria: Secondary | ICD-10-CM | POA: Diagnosis not present

## 2014-01-02 ENCOUNTER — Ambulatory Visit: Payer: Medicare Other

## 2014-01-12 ENCOUNTER — Ambulatory Visit: Payer: Medicare Other

## 2014-02-26 DIAGNOSIS — N3091 Cystitis, unspecified with hematuria: Secondary | ICD-10-CM | POA: Diagnosis not present

## 2014-02-26 DIAGNOSIS — N3001 Acute cystitis with hematuria: Secondary | ICD-10-CM | POA: Diagnosis not present

## 2014-03-06 ENCOUNTER — Ambulatory Visit: Payer: Medicare Other | Admitting: Family Medicine

## 2014-03-07 ENCOUNTER — Ambulatory Visit: Payer: Medicare Other | Admitting: Family Medicine

## 2014-03-13 ENCOUNTER — Encounter: Payer: Self-pay | Admitting: Family Medicine

## 2014-03-13 ENCOUNTER — Ambulatory Visit (INDEPENDENT_AMBULATORY_CARE_PROVIDER_SITE_OTHER): Payer: Medicare Other | Admitting: Family Medicine

## 2014-03-13 VITALS — BP 122/78 | HR 80 | Temp 97.9°F | Ht 60.34 in | Wt 134.2 lb

## 2014-03-13 DIAGNOSIS — I10 Essential (primary) hypertension: Secondary | ICD-10-CM | POA: Diagnosis not present

## 2014-03-13 DIAGNOSIS — E782 Mixed hyperlipidemia: Secondary | ICD-10-CM

## 2014-03-13 DIAGNOSIS — J309 Allergic rhinitis, unspecified: Secondary | ICD-10-CM

## 2014-03-13 NOTE — Patient Instructions (Signed)
BEFORE YOU LEAVE: -schedule fasting lab appointment in the next 1-2 weeks -follow up in 3-4 months   -We have ordered labs or studies at this visit. It can take up to 1-2 weeks for results and processing. We will contact you with instructions IF your results are abnormal. Normal results will be released to your [facility]. If you have not heard from [place] or can not find your results in [facility] in 2 weeks please contact our office.  -PLEASE SIGN UP FOR MYCHART TODAY   We recommend the following healthy lifestyle measures: - eat a healthy diet consisting of lots of vegetables, fruits, beans, nuts, seeds, healthy meats such as white chicken and fish and whole grains.  - avoid fried foods, fast food, processed foods, sodas, red meet and other fattening foods.  - get a least 150 minutes of aerobic exercise per week.

## 2014-03-13 NOTE — Progress Notes (Signed)
Pre visit review using our clinic review tool, if applicable. No additional management support is needed unless otherwise documented below in the visit note. 

## 2014-03-13 NOTE — Progress Notes (Signed)
HPI:   Follow up:  HTN: -meds: asa, amlodipine 5, atenolol 50 daily, valsartan-hctz 160-12.5 daily -denies: CP, swelling, palpitations  HLD: -meds: atorvastatin 20 mg daily -denies: legs cramps, cognitive decline  Seasonal allergies: -uses flonase prn -denies: doing well  HM: dexa, declined  ROS: See pertinent positives and negatives per HPI.  Past Medical History  Diagnosis Date  . Hyperlipidemia   . Hypertension   . Allergy   . Cataract   . GERD (gastroesophageal reflux disease)   . HEMORRHOIDS, INTERNAL 10/15/2006    Qualifier: Diagnosis of  By: Lenna Gilford MD, [place], ADENOMATOUS 10/15/2006    Qualifier: Diagnosis of  By: Lenna Gilford MD, [place] 09/28/2007    Qualifier: Diagnosis of  By: Lenna Gilford MD, Deborra Medina   . Irritable bowel syndrome 10/15/2006    Qualifier: Diagnosis of  By: Lenna Gilford MD, [place] DISEASE 09/28/2007    Qualifier: Diagnosis of  By: Lenna Gilford MD, Deborra Medina     Past Surgical History  Procedure Laterality Date  . Vaginal hysterectomy    . Polypectomy    . Colonoscopy    . Dental surgery      Family History  Problem Relation Age of Onset  . Heart disease Mother   . Kidney disease Father     History   Social History  . Marital Status: Widowed    Spouse Name: N/A    Number of Children: N/A  . Years of Education: N/A   Social History Main Topics  . Smoking status: Never Smoker   . Smokeless tobacco: Never Used  . Alcohol Use: No  . Drug Use: No  . Sexual Activity: None   Other Topics Concern  . 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  acetaminophen (TYLENOL) 500 MG tablet, Take 500 mg by mouth every 6 (six) hours as needed., Disp: , Rfl:  .  amLODipine (NORVASC) 5 MG tablet,  TAKE ONE TABLET BY MOUTH EVERY DAY, Disp: 90 tablet, Rfl: 3 .  aspirin 81 MG EC tablet, Take 81 mg by mouth daily.  , Disp: , Rfl:  .  atenolol (TENORMIN) 50 MG tablet, TAKE ONE TABLET BY MOUTH EVERY DAY, Disp: 90 tablet, Rfl: 3 .  atorvastatin (LIPITOR) 20 MG tablet, Take 1 tablet (20 mg total) by mouth daily., Disp: 90 tablet, Rfl: 3 .  Coenzyme Q10 (CO Q-10 PO), Take by mouth daily., Disp: , Rfl:  .  fluticasone (FLONASE) 50 MCG/ACT nasal spray, Place 2 sprays into both nostrils daily., Disp: 16 g, Rfl: 11 .  fluticasone (FLOVENT HFA) 44 MCG/ACT inhaler, INHALE TWO PUFFS INTO THE LUNGS TWICE DAILY, Disp: 11 g, Rfl: 11 .  valsartan-hydrochlorothiazide (DIOVAN-HCT) 160-12.5 MG per tablet, TAKE ONE TABLET BY MOUTH EVERY DAY, Disp: 90 tablet, Rfl: 3  EXAM:  Filed Vitals:   03/13/14 1107  BP: 122/78  Pulse: 80  Temp: 97.9 F (36.6 C)    Body mass index is 25.9 kg/(m^2).  GENERAL: vitals reviewed and listed above, alert, oriented, appears well hydrated and in no acute distress  HEENT: atraumatic, conjunttiva clear, no  obvious abnormalities on inspection of external nose and ears  NECK: no obvious masses on inspection  LUNGS: clear to auscultation bilaterally, no wheezes, rales or rhonchi, good air movement  CV: HRRR, no peripheral edema  MS: moves all extremities without noticeable abnormality  PSYCH: pleasant and cooperative, no obvious depression or anxiety  ASSESSMENT AND PLAN:  Discussed the following assessment and plan:  HYPERLIPIDEMIA, MIXED - Plan: Lipid panel  Essential hypertension - Plan: Basic metabolic panel  Allergic rhinitis, unspecified allergic rhinitis type  FASTING labs Follow up in 3-4 months -Patient advised to return or notify a doctor immediately if symptoms worsen or persist or new concerns arise.  Patient Instructions  BEFORE YOU LEAVE: -schedule fasting lab appointment in the next 1-2 weeks -follow up in 3-4 months   -We have ordered labs  or studies at this visit. It can take up to 1-2 weeks for results and processing. We will contact you with instructions IF your results are abnormal. Normal results will be released to your [facility]. If you have not heard from [place] or can not find your results in [facility] in 2 weeks please contact our office.  -PLEASE SIGN UP FOR MYCHART TODAY   We recommend the following healthy lifestyle measures: - eat a healthy diet consisting of lots of vegetables, fruits, beans, nuts, seeds, healthy meats such as white chicken and fish and whole grains.  - avoid fried foods, fast food, processed foods, sodas, red meet and other fattening foods.  - get a least 150 minutes of aerobic exercise per week.        Colin Benton R.

## 2014-03-27 ENCOUNTER — Other Ambulatory Visit: Payer: Medicare Other

## 2014-03-30 ENCOUNTER — Other Ambulatory Visit: Payer: Medicare Other

## 2014-05-15 ENCOUNTER — Other Ambulatory Visit: Payer: Self-pay | Admitting: Family Medicine

## 2014-07-07 ENCOUNTER — Other Ambulatory Visit: Payer: Self-pay | Admitting: Family Medicine

## 2014-07-12 ENCOUNTER — Other Ambulatory Visit: Payer: Self-pay | Admitting: Family Medicine

## 2014-07-17 ENCOUNTER — Ambulatory Visit: Payer: Medicare Other | Admitting: Family Medicine

## 2014-07-18 ENCOUNTER — Encounter: Payer: Self-pay | Admitting: Family Medicine

## 2014-07-18 ENCOUNTER — Ambulatory Visit (INDEPENDENT_AMBULATORY_CARE_PROVIDER_SITE_OTHER): Payer: Medicare Other | Admitting: Family Medicine

## 2014-07-18 VITALS — BP 122/62 | HR 75 | Temp 98.4°F | Ht 60.34 in | Wt 129.9 lb

## 2014-07-18 DIAGNOSIS — J309 Allergic rhinitis, unspecified: Secondary | ICD-10-CM

## 2014-07-18 DIAGNOSIS — E782 Mixed hyperlipidemia: Secondary | ICD-10-CM

## 2014-07-18 DIAGNOSIS — I10 Essential (primary) hypertension: Secondary | ICD-10-CM | POA: Diagnosis not present

## 2014-07-18 MED ORDER — FLUTICASONE PROPIONATE 50 MCG/ACT NA SUSP: 2.0000 | Freq: Every day | NASAL | Status: DC

## 2014-07-18 NOTE — Patient Instructions (Signed)
BEFORE YOU LEAVE: -fasting lab appointment in the next 1-2 weeks - drink plenty of water but otherwise no food before labs for 8 hours -MEDICARE wellness visit in October or November  We recommend the following healthy lifestyle measures: - eat a healthy diet consisting of lots of vegetables, fruits, beans, nuts, seeds, healthy meats such as white chicken and fish and whole grains.  - avoid fried foods, fast food, processed foods, sodas, red meet and other fattening foods.  - get a least 150 minutes of aerobic exercise per week.

## 2014-07-18 NOTE — Progress Notes (Signed)
Pre visit review using our clinic review tool, if applicable. No additional management support is needed unless otherwise documented below in the visit note. 

## 2014-07-18 NOTE — Progress Notes (Signed)
HPI:     HPI:  Follow up:  HTN: -meds: asa, amlodipine 5, atenolol 50 daily, valsartan-hctz 160-12.5 daily -denies: CP, swelling, palpitations  HLD: -meds: atorvastatin 20 mg daily -denies: legs cramps, cognitive decline  Seasonal allergies: -uses flonase prn and needs refill -denies: doing well -denies: SOB, DOE  Note: she was advised to schedule fasting labs after last visit, but appears did not do this  HM: dexa, declined  ROS: See pertinent positives and negatives per HPI.  Past Medical History  Diagnosis Date  . Hyperlipidemia   . Hypertension   . Allergy   . Cataract   . GERD (gastroesophageal reflux disease)   . HEMORRHOIDS, INTERNAL 10/15/2006    Qualifier: Diagnosis of  By: Lenna Gilford MD, [place], ADENOMATOUS 10/15/2006    Qualifier: Diagnosis of  By: Lenna Gilford MD, [place] 09/28/2007    Qualifier: Diagnosis of  By: Lenna Gilford MD, Deborra Medina   . Irritable bowel syndrome 10/15/2006    Qualifier: Diagnosis of  By: Lenna Gilford MD, [place] DISEASE 09/28/2007    Qualifier: Diagnosis of  By: Lenna Gilford MD, Deborra Medina   . Chronic bronchitis 10/15/2006    Qualifier: Diagnosis of  By: Lenna Gilford MD, Deborra Medina     Past Surgical History  Procedure Laterality Date  . Vaginal hysterectomy    . Polypectomy    . Colonoscopy    . Dental surgery      Family History  Problem Relation Age of Onset  . Heart disease Mother   . Kidney disease Father     History   Social History  . Marital Status: Widowed    Spouse Name: N/A  . Number of Children: N/A  . Years of Education: N/A   Social History Main Topics  . Smoking status: Never Smoker   . Smokeless tobacco: Never Used  . Alcohol Use: No  . Drug Use: No  . Sexual Activity: Not on file   Other Topics Concern  . 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  acetaminophen (TYLENOL) 500 MG tablet, Take 500 mg by mouth every 6 (six) hours as needed., Disp: , Rfl:  .  amLODipine (NORVASC) 5 MG tablet, TAKE ONE TABLET BY MOUTH ONCE DAILY, Disp: 90 tablet, Rfl: 0 .  aspirin 81 MG EC tablet, Take 81 mg by mouth daily.  , Disp: , Rfl:  .  atenolol (TENORMIN) 50 MG tablet, TAKE ONE TABLET BY MOUTH ONCE DAILY, Disp: 90 tablet, Rfl: 0 .  atorvastatin (LIPITOR) 20 MG tablet, TAKE ONE TABLET BY MOUTH ONCE DAILY, Disp: 90 tablet, Rfl: 0 .  Coenzyme Q10 (CO Q-10 PO), Take by mouth daily., Disp: , Rfl:  .  fluticasone (FLONASE) 50 MCG/ACT nasal spray, Place 2 sprays into both nostrils daily., Disp: 16 g, Rfl: 11 .  valsartan-hydrochlorothiazide (DIOVAN-HCT) 160-12.5 MG per tablet, TAKE ONE TABLET BY MOUTH ONCE DAILY, Disp: 90 tablet, Rfl: 0  EXAM:  Filed Vitals:   07/18/14 1255  BP: 122/62  Pulse: 75  Temp: 98.4 F (36.9 C)    Body mass index is 25.07 kg/(m^2).  GENERAL: vitals reviewed and listed above, alert, oriented, appears well hydrated and in no acute distress  HEENT: atraumatic, conjunttiva clear, no obvious abnormalities on inspection of external nose and ears  NECK: no obvious masses on inspection  LUNGS: clear to auscultation bilaterally, no wheezes, rales or rhonchi, good air movement  CV: HRRR, no peripheral edema  MS: moves all extremities without noticeable abnormality  PSYCH: pleasant and cooperative, no obvious depression or anxiety  ASSESSMENT AND PLAN:  Discussed the following assessment and plan:  HYPERLIPIDEMIA, MIXED  Essential hypertension  Allergic rhinitis, unspecified allergic rhinitis type - Plan: fluticasone (FLONASE) 50 MCG/ACT nasal spray  -reviewed medications and chronic problems -advised lifestyle recs -restart INS for allergic rhinitis -labs - she agreed to schedule FASTING labs, not fasting today -Patient  advised to return or notify a doctor immediately if symptoms worsen or persist or new concerns arise.  Patient Instructions  BEFORE YOU LEAVE: -fasting lab appointment in the next 1-2 weeks - drink plenty of water but otherwise no food before labs for 8 hours -MEDICARE wellness visit in October or November  We recommend the following healthy lifestyle measures: - eat a healthy diet consisting of lots of vegetables, fruits, beans, nuts, seeds, healthy meats such as white chicken and fish and whole grains.  - avoid fried foods, fast food, processed foods, sodas, red meet and other fattening foods.  - get a least 150 minutes of aerobic exercise per week.       Colin Benton R.

## 2014-07-27 ENCOUNTER — Other Ambulatory Visit: Payer: Medicare Other

## 2014-07-28 ENCOUNTER — Other Ambulatory Visit: Payer: Medicare Other

## 2014-08-10 ENCOUNTER — Ambulatory Visit: Payer: Medicare Other | Admitting: Family Medicine

## 2014-08-18 ENCOUNTER — Ambulatory Visit (INDEPENDENT_AMBULATORY_CARE_PROVIDER_SITE_OTHER): Payer: Medicare Other | Admitting: Family Medicine

## 2014-08-18 ENCOUNTER — Encounter: Payer: Self-pay | Admitting: Family Medicine

## 2014-08-18 ENCOUNTER — Encounter: Payer: Self-pay | Admitting: *Deleted

## 2014-08-18 VITALS — BP 138/70 | HR 72 | Temp 98.1°F | Ht 60.34 in | Wt 127.4 lb

## 2014-08-18 DIAGNOSIS — E782 Mixed hyperlipidemia: Secondary | ICD-10-CM

## 2014-08-18 DIAGNOSIS — K219 Gastro-esophageal reflux disease without esophagitis: Secondary | ICD-10-CM | POA: Diagnosis not present

## 2014-08-18 DIAGNOSIS — I10 Essential (primary) hypertension: Secondary | ICD-10-CM

## 2014-08-18 DIAGNOSIS — J309 Allergic rhinitis, unspecified: Secondary | ICD-10-CM | POA: Diagnosis not present

## 2014-08-18 LAB — BASIC METABOLIC PANEL
BUN: 11 mg/dL (ref 6–23)
CHLORIDE: 98 meq/L (ref 96–112)
CO2: 33 mEq/L — ABNORMAL HIGH (ref 19–32)
Calcium: 9.8 mg/dL (ref 8.4–10.5)
Creatinine, Ser: 0.81 mg/dL (ref 0.40–1.20)
GFR: 72.01 mL/min (ref >60.00)
Glucose, Bld: 96 mg/dL (ref 70–99)
POTASSIUM: 4 meq/L (ref 3.5–5.1)
SODIUM: 138 meq/L (ref 135–145)

## 2014-08-18 LAB — LIPID PANEL
CHOL/HDL RATIO: 6
Cholesterol: 203 mg/dL — ABNORMAL HIGH (ref 0–200)
HDL: 36.5 mg/dL — AB (ref >39.00)
NONHDL: 166.5
TRIGLYCERIDES: 352 mg/dL — AB (ref 0.0–149.0)
VLDL: 70.4 mg/dL — AB (ref 0.0–40.0)

## 2014-08-18 LAB — LDL CHOLESTEROL, DIRECT: LDL DIRECT: 89 mg/dL

## 2014-08-18 NOTE — Patient Instructions (Addendum)
Schedule medicare wellness exam in 3-4 months  Labs prior to leaving

## 2014-08-18 NOTE — Progress Notes (Signed)
Pre visit review using our clinic review tool, if applicable. No additional management support is needed unless otherwise documented below in the visit note. 

## 2014-08-18 NOTE — Progress Notes (Signed)
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 New management and needs statement of health from primary doctor that she does not have health conditions that would limit her from doing her job. She will get her fasting lab work done today. She has HTN (well controlled), HLD, GERD and seasonal allergies. No mental health conditions that I am aware of. She had a negative mini-mental status exam today. She denies any heavy lifting at her job. Denies vision or hearing problems, depression, cog or mental health problems, joint or muscle complaints, CP, SOB, DOE, malaise, bleeding.  Takes medications daily.  ROS: See pertinent positives and negatives per HPI.  Past Medical History  Diagnosis Date  . Hyperlipidemia   . Hypertension   . Allergy   . Cataract   . GERD (gastroesophageal reflux disease)   . HEMORRHOIDS, INTERNAL 10/15/2006    Qualifier: Diagnosis of  By: Lenna Gilford MD, [place], ADENOMATOUS 10/15/2006    Qualifier: Diagnosis of  By: Lenna Gilford MD, [place] 09/28/2007    Qualifier: Diagnosis of  By: Lenna Gilford MD, Deborra Medina   . Irritable bowel syndrome 10/15/2006    Qualifier: Diagnosis of  By: Lenna Gilford MD, [place] DISEASE 09/28/2007    Qualifier: Diagnosis of  By: Lenna Gilford MD, Deborra Medina   . Chronic bronchitis 10/15/2006    Qualifier: Diagnosis of  By: Lenna Gilford MD, Deborra Medina     Past Surgical History  Procedure Laterality Date  . Vaginal hysterectomy    . Polypectomy    . Colonoscopy    . Dental surgery      Family History  Problem Relation Age of Onset  . Heart disease Mother   . Kidney disease Father     History   Social History  . Marital Status: Widowed    Spouse Name: N/A  . Number of Children: N/A  . Years of Education: N/A   Social History Main Topics  . Smoking status: Never Smoker   . Smokeless tobacco: Never Used  . Alcohol Use: No  . Drug Use: No  . Sexual Activity: Not on  file   Other Topics Concern  . 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  acetaminophen (TYLENOL) 500 MG tablet, Take 500 mg by mouth every 6 (six) hours as needed., Disp: , Rfl:  .  amLODipine (NORVASC) 5 MG tablet, TAKE ONE TABLET BY MOUTH ONCE DAILY, Disp: 90 tablet, Rfl: 0 .  aspirin 81 MG EC tablet, Take 81 mg by mouth daily.  , Disp: , Rfl:  .  atenolol (TENORMIN) 50 MG tablet, TAKE ONE TABLET BY MOUTH ONCE DAILY, Disp: 90 tablet, Rfl: 0 .  atorvastatin (LIPITOR) 20 MG tablet, TAKE ONE TABLET BY MOUTH ONCE DAILY, Disp: 90 tablet, Rfl: 0 .  Coenzyme Q10 (CO Q-10 PO), Take by mouth daily., Disp: , Rfl:  .  fluticasone (FLONASE) 50 MCG/ACT nasal spray, Place 2 sprays into both nostrils daily., Disp: 16 g, Rfl: 11 .  valsartan-hydrochlorothiazide (DIOVAN-HCT) 160-12.5 MG per tablet, TAKE ONE TABLET BY  MOUTH ONCE DAILY, Disp: 90 tablet, Rfl: 0  EXAM:  Filed Vitals:   08/18/14 1254  BP: 138/70  Pulse: 72  Temp: 98.1 F (36.7 C)    Body mass index is 24.59 kg/(m^2).  GENERAL: vitals reviewed and listed above, alert, oriented, appears well hydrated and in no acute distress, well groomed  HEENT: atraumatic, conjunttiva clear, visual acuity grossly intact, hearing grossly intact, no obvious abnormalities on inspection of external nose and ears  NECK: no obvious masses on inspection  LUNGS: clear to auscultation bilaterally, no wheezes, rales or rhonchi, good air movement  CV: HRRR, no peripheral edema  MS: moves all extremities without noticeable abnormality  PSYCH: pleasant and cooperative, no obvious depression or anxiety  NEURO: mini mental status exam neg, speech and thought processing grossly intact, normal gait  ASSESSMENT AND PLAN:  Discussed the following  assessment and plan:  HYPERLIPIDEMIA, MIXED - Plan: Lipid Panel  Essential hypertension - Plan: Basic metabolic panel  Allergic rhinitis, unspecified allergic rhinitis type  Gastroesophageal reflux disease, esophagitis presence not specified  -fasting labs prior to leaving -letter completed and provided to patient -Patient advised to return or notify a doctor immediately if symptoms worsen or persist or new concerns arise.  Patient Instructions       Rhonda Weaver.

## 2014-08-29 ENCOUNTER — Telehealth: Payer: Self-pay | Admitting: Family Medicine

## 2014-08-29 NOTE — Telephone Encounter (Signed)
Returning call to Herbert Spires, Mudlogger of operations at [employer] who called our office on 08/22/14 requesting to speak with Dr. Maudie Mercury regarding letter written on behalf of patient on 08/18/14.  Ms. Elio Forget expressed concerns related to patients cognitive ability and memory loss.  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  She states these concerns are negatively impacting the patient's job performance and the patient is in jeopardy of losing her job if improvements are not made.  I informed Ms. Elio Forget that our office is not permitted to disclose any of the patients health/medical information without a written consent from the patient due to HIPAA guidelines.  However, I would forward this information on to Dr. Maudie Mercury as an fyi.  She states she understands we could not disclose the patient's information but wanted Dr. Maudie Mercury to be aware of the concerns since the letter that was written on 08/18/14 makes no mention of any concerns related to memory loss.

## 2014-08-29 NOTE — Telephone Encounter (Signed)
Please call patient. I would advise a neurology evaluation if patient is agreeable to this. I did a mini-cognitive exam at her appointment and she scored well on this.

## 2014-08-29 NOTE — Telephone Encounter (Signed)
Called pt, no answer.  Left message with direct phone number for patient to return my call.

## 2014-08-30 NOTE — Telephone Encounter (Signed)
After a second attempt of calling the patient, I was able to reach her via phone.  I informed Rhonda Weaver that our office received a call from her employer to report concerns of her forgetting things.  The patient denied the accussations and stated there was a disgruntle coworker at her job who's making this information up in an attempt to get her fired.  Rhonda Weaver states her employer is trying to get rid of her due to her age.  She declined a referral neurology for further evaluation as she feels this is unnecessary.  I informed the patient that we are not permitted to disclose any of her medical information to her employer of anyone else without her written consent and that we had done so.  Rhonda Weaver acknowledge understanding of this and stated she appreciated the call.  Pt was advised to contact our office if she required any additional assistance.

## 2014-09-11 ENCOUNTER — Telehealth: Payer: Self-pay | Admitting: Family Medicine

## 2014-09-11 NOTE — Telephone Encounter (Signed)
Suandrea-can you help with this?

## 2014-09-11 NOTE — Telephone Encounter (Signed)
Alberteen Spindle director took [employer] place and would like to confirm the letter dr Maudie Mercury wrote is still accurate. Opal Sidles would like to put pt back to work

## 2014-09-12 NOTE — Telephone Encounter (Signed)
Spoke with Opal Sidles to confirm letter Dr. Maudie Mercury previously wrote is accurate.

## 2014-10-06 ENCOUNTER — Other Ambulatory Visit: Payer: Self-pay | Admitting: *Deleted

## 2014-10-06 MED ORDER — ATORVASTATIN CALCIUM 20 MG PO TABS: 20.0000 mg | ORAL_TABLET | Freq: Every day | ORAL | Status: DC

## 2014-10-06 MED ORDER — AMLODIPINE BESYLATE 5 MG PO TABS: 5.0000 mg | ORAL_TABLET | Freq: Every day | ORAL | Status: DC

## 2014-10-06 NOTE — Telephone Encounter (Signed)
lipitor refilled

## 2014-10-06 NOTE — Telephone Encounter (Signed)
Amlodipine refilled.

## 2014-11-17 DIAGNOSIS — R300 Dysuria: Secondary | ICD-10-CM | POA: Diagnosis not present

## 2014-11-30 DIAGNOSIS — Z23 Encounter for immunization: Secondary | ICD-10-CM | POA: Diagnosis not present

## 2014-12-13 ENCOUNTER — Encounter: Payer: Medicare Other | Admitting: Family Medicine

## 2014-12-15 ENCOUNTER — Encounter: Payer: Medicare Other | Admitting: Family Medicine

## 2015-01-17 ENCOUNTER — Other Ambulatory Visit: Payer: Self-pay | Admitting: Family Medicine

## 2015-01-24 ENCOUNTER — Other Ambulatory Visit: Payer: Self-pay | Admitting: Family Medicine

## 2015-02-14 ENCOUNTER — Encounter: Payer: Self-pay | Admitting: Family Medicine

## 2015-02-14 ENCOUNTER — Ambulatory Visit (INDEPENDENT_AMBULATORY_CARE_PROVIDER_SITE_OTHER): Payer: Medicare Other | Admitting: Family Medicine

## 2015-02-14 VITALS — BP 142/68 | HR 75 | Temp 98.2°F | Ht 61.75 in | Wt 123.1 lb

## 2015-02-14 DIAGNOSIS — J309 Allergic rhinitis, unspecified: Secondary | ICD-10-CM | POA: Diagnosis not present

## 2015-02-14 DIAGNOSIS — E2839 Other primary ovarian failure: Secondary | ICD-10-CM

## 2015-02-14 DIAGNOSIS — E782 Mixed hyperlipidemia: Secondary | ICD-10-CM

## 2015-02-14 DIAGNOSIS — Z0000 Encounter for general adult medical examination without abnormal findings: Secondary | ICD-10-CM | POA: Diagnosis not present

## 2015-02-14 DIAGNOSIS — I10 Essential (primary) hypertension: Secondary | ICD-10-CM | POA: Diagnosis not present

## 2015-02-14 LAB — LIPID PANEL
Cholesterol: 222 mg/dL — ABNORMAL HIGH (ref 0–200)
HDL: 36.9 mg/dL — ABNORMAL LOW (ref >39.00)
NONHDL: 184.63
Total CHOL/HDL Ratio: 6
Triglycerides: 254 mg/dL — ABNORMAL HIGH (ref 0.0–149.0)
VLDL: 50.8 mg/dL — ABNORMAL HIGH (ref 0.0–40.0)

## 2015-02-14 LAB — BASIC METABOLIC PANEL
BUN: 11 mg/dL (ref 6–23)
CALCIUM: 9.7 mg/dL (ref 8.4–10.5)
CHLORIDE: 101 meq/L (ref 96–112)
CO2: 31 meq/L (ref 19–32)
Creatinine, Ser: 0.81 mg/dL (ref 0.40–1.20)
GFR: 71.92 mL/min (ref >60.00)
GLUCOSE: 107 mg/dL — AB (ref 70–99)
POTASSIUM: 3.7 meq/L (ref 3.5–5.1)
SODIUM: 141 meq/L (ref 135–145)

## 2015-02-14 LAB — LDL CHOLESTEROL, DIRECT: Direct LDL: 117 mg/dL

## 2015-02-14 NOTE — Progress Notes (Signed)
Pre visit review using our clinic review tool, if applicable. No additional management support is needed unless otherwise documented below in the visit note. 

## 2015-02-14 NOTE — Patient Instructions (Signed)
BEFORE YOU LEAVE: -schedule follow up in 6 months -labs  -We placed a referral for you as discussed for your bone density test. It usually takes about 1-2 weeks to process and schedule this referral. If you have not heard from [place] regarding this appointment in 2 weeks please contact our office.  Please obtain the record from your pneumococcal vaccine.  Please call with the number provided to schedule your mammogram  Vit D3 [phone] IU daily  We recommend the following healthy lifestyle measures: - eat a healthy whole foods diet consisting of regular small meals composed of vegetables, fruits, beans, nuts, seeds, healthy meats such as white chicken and fish and whole grains.  - avoid sweets, white starchy foods, fried foods, fast food, processed foods, sodas, red meet and other fattening foods.  - get a least 150-300 minutes of aerobic exercise per week.   -We have ordered labs or studies at this visit. It can take up to 1-2 weeks for results and processing. We will contact you with instructions IF your results are abnormal. Normal results will be released to your [facility]. If you have not heard from [place] or can not find your results in [facility] in 2 weeks please contact our office.

## 2015-02-14 NOTE — Progress Notes (Signed)
Medicare Annual Preventive Care Visit  (initial annual wellness or annual wellness exam)  Concerns and/or follow up today:  HTN: -meds: asa, amlodipine 5, atenolol 50 daily, valsartan-hctz 160-12.5 daily -denies: CP, swelling, palpitations  HLD: -meds: atorvastatin 20 mg daily -denies: legs cramps, cognitive decline  Seasonal allergies: -denies: doing well -denies: SOB, DOE  ROS: negative for report of fevers, unintentional weight loss, vision changes, vision loss, hearing loss or change, chest pain, sob, hemoptysis, melena, hematochezia, hematuria, genital discharge or lesions, falls, bleeding or bruising, loc, thoughts of suicide or self harm, memory loss  1.) Patient-completed health risk assessment  - completed and reviewed, see scanned documentation  2.) Review of Medical History: -PMH, PSH, Family History and current specialty and care providers reviewed and updated and listed below  - see scanned in document in chart and below  Past Medical History  Diagnosis Date  . Hyperlipidemia   . Hypertension   . Allergy   . Cataract   . GERD (gastroesophageal reflux disease)   . HEMORRHOIDS, INTERNAL 10/15/2006    Qualifier: Diagnosis of  By: Lenna Gilford MD, [place], ADENOMATOUS 10/15/2006    Qualifier: Diagnosis of  By: Lenna Gilford MD, [place] 09/28/2007    Qualifier: Diagnosis of  By: Lenna Gilford MD, Deborra Medina   . Irritable bowel syndrome 10/15/2006    Qualifier: Diagnosis of  By: Lenna Gilford MD, [place] DISEASE 09/28/2007    Qualifier: Diagnosis of  By: Lenna Gilford MD, Deborra Medina   . Chronic bronchitis ([place]) 10/15/2006    Qualifier: Diagnosis of  By: Lenna Gilford MD, Deborra Medina     Past Surgical History  Procedure Laterality Date  . Vaginal hysterectomy    . Polypectomy    . Colonoscopy    . Dental surgery      Social History   Social History  . Marital Status: Widowed    Spouse Name: N/A  . Number of Children: N/A  . Years of Education: N/A    Occupational History  . Not on file.   Social History Main Topics  . Smoking status: Never Smoker   . Smokeless tobacco: Never Used  . Alcohol Use: No  . Drug Use: No  . Sexual Activity: Not on file   Other Topics Concern  . 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 Heart disease Mother   . Kidney disease Father     Current Outpatient Prescriptions on File Prior to Visit  Medication Sig Dispense Refill  . acetaminophen (TYLENOL) 500 MG tablet Take 500 mg by mouth every 6 (six) hours as needed.    Marland Kitchen amLODipine (NORVASC) 5 MG tablet Take 1 tablet (5 mg total) by mouth daily. 90 tablet 1  . aspirin 81 MG EC tablet Take 81 mg by mouth daily.      Marland Kitchen atenolol (TENORMIN) 50 MG tablet TAKE ONE TABLET BY MOUTH ONCE DAILY 90 tablet 0  . atorvastatin (LIPITOR) 20 MG tablet Take 1 tablet (20 mg total) by mouth daily. 90 tablet 1  . Coenzyme Q10 (CO Q-10 PO) Take  by mouth daily.    . fluticasone (FLONASE) 50 MCG/ACT nasal spray Place 2 sprays into both nostrils daily. 16 g 11  . valsartan-hydrochlorothiazide (DIOVAN-HCT) 160-12.5 MG per tablet TAKE ONE TABLET BY MOUTH ONCE DAILY 90 tablet 0   No current facility-administered medications on file prior to visit.     3.) Review of functional ability and level of safety:  Any difficulty hearing?  NO  History of falling?  NO  Any trouble with IADLs - using a phone, using transportation, grocery shopping, preparing meals, doing housework, doing laundry, taking medications and managing money? NO  Advance Directives? NO  See summary of recommendations in Patient Instructions below.  4.) Physical Exam Filed Vitals:   02/14/15 1139  BP: 142/68  Pulse: 75  Temp: 98.2 F (36.8 C)   Estimated body mass  index is 22.71 kg/(m^2) as calculated from the following:   Height as of this encounter: 5' 1.75" (1.568 m).   Weight as of this encounter: 123 lb 1.6 oz (55.838 kg).  EKG (optional): deferred  General: alert, appear well hydrated and in no acute distress  HEENT: visual acuity grossly intact  CV: HRRR  Lungs: CTA bilaterally  Psych: pleasant and cooperative, no obvious depression or anxiety  Cognitive function grossly intact  See patient instructions for recommendations.  Education and counseling regarding the above review of health provided with a plan for the following: -see scanned patient completed form for further details -fall prevention strategies discussed  -healthy lifestyle discussed -importance and resources for completing advanced directives discussed -see patient instructions below for any other recommendations provided  4)The following written screening schedule of preventive measures were reviewed with assessment and plan made per below, orders and patient instructions:      AAA screening done if applicable     Alcohol screening done     Obesity Screening and counseling done     STI screening (Hep C if born [birth year]) offered and per pt wishes     Tobacco Screening done done       Pneumococcal (PPSV23 -one dose after 64, one before if risk factors), influenza yearly and hepatitis B vaccines (if high risk - end stage renal disease, IV drugs, homosexual men, live in home for mentally retarded, hemophilia receiving factors) ASSESSMENT/PLAN: done if applicable      Screening mammograph (yearly if >40) ASSESSMENT/PLAN: utd or ordered      Screening Pap smear/pelvic exam (q2 years) ASSESSMENT/PLAN: n/a, declined      Prostate cancer screening ASSESSMENT/PLAN: n/a, declined      Colorectal cancer screening (FOBT yearly or flex sig q4y or colonoscopy q10y or barium enema q4y) ASSESSMENT/PLAN: utd or ordered      Diabetes outpatient self-management training  services ASSESSMENT/PLAN: utd or done      Bone mass measurements(covered q2y if indicated - estrogen def, osteoporosis, hyperparathyroid, vertebral abnormalities, osteoporosis or steroids) ASSESSMENT/PLAN: utd or discussed and ordered per pt wishes      Screening for glaucoma(q1y if high risk - diabetes, FH, AA and > 50 or hispanic and > 65) ASSESSMENT/PLAN: utd or advised      Medical nutritional therapy for individuals with diabetes or renal disease ASSESSMENT/PLAN: see orders      Cardiovascular screening blood tests (lipids q5y) ASSESSMENT/PLAN: see orders and labs      Diabetes screening tests ASSESSMENT/PLAN: see orders and labs   7.) Summary: -risk factors and conditions per above assessment were discussed and treatment, recommendations and referrals were offered per documentation  above and orders and patient instructions.  Medicare annual wellness visit, subsequent  HYPERLIPIDEMIA, MIXED - Plan: Lipid Panel  Essential hypertension - Plan: Basic metabolic panel  Allergic rhinitis, unspecified allergic rhinitis type  Estrogen deficiency - Plan: DG Bone Density  Patient Instructions  BEFORE YOU LEAVE: -schedule follow up in 6 months -labs  -We placed a referral for you as discussed for your bone density test. It usually takes about 1-2 weeks to process and schedule this referral. If you have not heard from [place] regarding this appointment in 2 weeks please contact our office.  Please obtain the record from your pneumococcal vaccine.  Please call with the number provided to schedule your mammogram  Vit D3 [phone] IU daily  We recommend the following healthy lifestyle measures: - eat a healthy whole foods diet consisting of regular small meals composed of vegetables, fruits, beans, nuts, seeds, healthy meats such as white chicken and fish and whole grains.  - avoid sweets, white starchy foods, fried foods, fast food, processed foods, sodas, red meet and other  fattening foods.  - get a least 150-300 minutes of aerobic exercise per week.   -We have ordered labs or studies at this visit. It can take up to 1-2 weeks for results and processing. We will contact you with instructions IF your results are abnormal. Normal results will be released to your [facility]. If you have not heard from [place] or can not find your results in [facility] in 2 weeks please contact our office.

## 2015-04-18 DIAGNOSIS — H43393 Other vitreous opacities, bilateral: Secondary | ICD-10-CM | POA: Diagnosis not present

## 2015-04-18 DIAGNOSIS — H40033 Anatomical narrow angle, bilateral: Secondary | ICD-10-CM | POA: Diagnosis not present

## 2015-05-05 ENCOUNTER — Other Ambulatory Visit: Payer: Self-pay | Admitting: Family Medicine

## 2015-05-08 ENCOUNTER — Other Ambulatory Visit: Payer: Self-pay | Admitting: Family Medicine

## 2015-05-09 ENCOUNTER — Other Ambulatory Visit: Payer: Self-pay | Admitting: Family Medicine

## 2015-05-21 DIAGNOSIS — J324 Chronic pansinusitis: Secondary | ICD-10-CM | POA: Diagnosis not present

## 2015-06-04 ENCOUNTER — Other Ambulatory Visit: Payer: Self-pay

## 2015-06-04 DIAGNOSIS — Z1231 Encounter for screening mammogram for malignant neoplasm of breast: Secondary | ICD-10-CM

## 2015-06-06 DIAGNOSIS — J209 Acute bronchitis, unspecified: Secondary | ICD-10-CM | POA: Diagnosis not present

## 2015-06-14 ENCOUNTER — Ambulatory Visit: Payer: Medicare Other | Admitting: Family Medicine

## 2015-06-18 ENCOUNTER — Ambulatory Visit (INDEPENDENT_AMBULATORY_CARE_PROVIDER_SITE_OTHER): Payer: Medicare Other | Admitting: Family Medicine

## 2015-06-18 ENCOUNTER — Encounter: Payer: Self-pay | Admitting: Family Medicine

## 2015-06-18 VITALS — BP 142/74 | HR 90 | Temp 98.3°F | Ht 61.75 in | Wt 118.6 lb

## 2015-06-18 DIAGNOSIS — I10 Essential (primary) hypertension: Secondary | ICD-10-CM | POA: Diagnosis not present

## 2015-06-18 DIAGNOSIS — E782 Mixed hyperlipidemia: Secondary | ICD-10-CM | POA: Diagnosis not present

## 2015-06-18 DIAGNOSIS — J069 Acute upper respiratory infection, unspecified: Secondary | ICD-10-CM | POA: Diagnosis not present

## 2015-06-18 MED ORDER — BENZONATATE 100 MG PO CAPS: 100.0000 mg | ORAL_CAPSULE | Freq: Three times a day (TID) | ORAL | Status: DC | PRN

## 2015-06-18 NOTE — Patient Instructions (Addendum)
Before you leave: -schedule a follow-up to recheck the blood pressure in about 1 month  In sure you're eating a healthy low sodium diet with lots of fruits and veggies. We recommend the following healthy lifestyle measures: - eat a healthy whole foods diet consisting of regular small meals composed of vegetables, fruits, beans, nuts, seeds, healthy meats such as white chicken and fish and whole grains.  - avoid sweets, white starchy foods, fried foods, fast food, processed foods, sodas, red meet and other fattening foods.  - get a least 150-300 minutes of aerobic exercise per week.   INSTRUCTIONS FOR UPPER RESPIRATORY INFECTION:  -plenty of rest and fluids  -nasal saline wash 2-3 times daily (use prepackaged nasal saline or bottled/distilled water if making your own)   -can use AFRIN nasal spray for drainage and nasal congestion - but do NOT use longer then 3-4 days  -can use tylenol (in no history of liver disease) or ibuprofen (if no history of kidney disease, bowel bleeding or significant heart disease) as directed for aches and sorethroat  -in the winter time, using a humidifier at night is helpful (please follow cleaning instructions)  -if you are taking a cough medication - use only as directed, may also try a teaspoon of honey to coat the throat and throat lozenges.  -for sore throat, salt water gargles can help  -follow up if you have fevers, facial pain, tooth pain, difficulty breathing or are worsening or symptoms persist longer then expected  Upper Respiratory Infection, Adult An upper respiratory infection (URI) is also known as the common cold. It is often caused by a type of germ (virus). Colds are easily spread (contagious). You can pass it to others by kissing, coughing, sneezing, or drinking out of the same glass. Usually, you get better in 1 to 3  weeks.  However, the cough can last for even longer. HOME CARE   Only take medicine as told by your doctor. Follow  instructions provided above.  Drink enough water and fluids to keep your pee (urine) clear or pale yellow.  Get plenty of rest.  Return to work when your temperature is < 100 for 24 hours or as told by your doctor. You may use a face mask and wash your hands to stop your cold from spreading. GET HELP RIGHT AWAY IF:   After the first few days, you feel you are getting worse.  You have questions about your medicine.  You have chills, shortness of breath, or red spit (mucus).  You have pain in the face for more then 1-2 days, especially when you bend forward.  You have a fever, puffy (swollen) neck, pain when you swallow, or white spots in the back of your throat.  You have a bad headache, ear pain, sinus pain, or chest pain.  You have a high-pitched whistling sound when you breathe in and out (wheezing).  You cough up blood.  You have sore muscles or a stiff neck. MAKE SURE YOU:   Understand these instructions.  Will watch your condition.  Will get help right away if you are not doing well or get worse. Document Released: 07/16/2007 Document Revised: 04/21/2011 Document Reviewed: 05/04/2013 [facility] Patient Information 2015 [place], [place]. This information is not intended to replace advice given to you by your health care provider. Make sure you discuss any questions you have with your health care provider.

## 2015-06-18 NOTE — Progress Notes (Signed)
HPI:  HTN: -meds: asa, amlodipine 5, atenolol 50 daily, valsartan-hctz 160-12.5 daily -denies: CP, swelling, palpitations  HLD: -meds: atorvastatin 20 mg daily -denies: legs cramps, cognitive decline  Seasonal allergies/URI: -denies: doing well - thinks has cold today - started yesterday -cough, congestion, drainage, mild chills -denies: SOB, DOE  ROS: See pertinent positives and negatives per HPI.  Past Medical History  Diagnosis Date  . Hyperlipidemia   . Hypertension   . Allergy   . Cataract   . GERD (gastroesophageal reflux disease)   . HEMORRHOIDS, INTERNAL 10/15/2006    Qualifier: Diagnosis of  By: Lenna Gilford MD, [place], ADENOMATOUS 10/15/2006    Qualifier: Diagnosis of  By: Lenna Gilford MD, [place] 09/28/2007    Qualifier: Diagnosis of  By: Lenna Gilford MD, Deborra Medina   . Irritable bowel syndrome 10/15/2006    Qualifier: Diagnosis of  By: Lenna Gilford MD, [place] DISEASE 09/28/2007    Qualifier: Diagnosis of  By: Lenna Gilford MD, Deborra Medina   . Chronic bronchitis ([employer]) 10/15/2006    Qualifier: Diagnosis of  By: Lenna Gilford MD, Deborra Medina     Past Surgical History  Procedure Laterality Date  . Vaginal hysterectomy    . Polypectomy    . Colonoscopy    . Dental surgery      Family History  Problem Relation Age of Onset  . Heart disease Mother   . Kidney disease Father     Social History   Social History  . Marital Status: Widowed    Spouse Name: N/A  . Number of Children: N/A  . Years of Education: N/A   Social History Main Topics  . Smoking status: Never Smoker   . Smokeless tobacco: Never Used  . Alcohol Use: No  . Drug Use: No  . Sexual Activity: Not Asked   Other Topics Concern  . 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  acetaminophen (TYLENOL) 500 MG tablet, Take 500 mg by mouth every 6 (six) hours as needed., Disp: , Rfl:  .  amLODipine (NORVASC) 5 MG tablet, TAKE ONE TABLET BY MOUTH ONCE DAILY, Disp: 90 tablet, Rfl: 1 .  aspirin 81 MG EC tablet, Take 81 mg by mouth daily.  , Disp: , Rfl:  .  atenolol (TENORMIN) 50 MG tablet, TAKE ONE TABLET BY MOUTH ONCE DAILY, Disp: 90 tablet, Rfl: 0 .  atorvastatin (LIPITOR) 20 MG tablet, TAKE ONE TABLET BY MOUTH ONCE DAILY, Disp: 90 tablet, Rfl: 0 .  Bismuth Subsalicylate (PEPTO-BISMOL PO), Take by mouth., Disp: , Rfl:  .  Coenzyme Q10 (CO Q-10 PO), Take by mouth daily., Disp: , Rfl:  .  fluticasone (FLONASE) 50 MCG/ACT nasal spray, Place 2 sprays into both nostrils daily., Disp: 16 g, Rfl: 11 .  fluticasone (FLOVENT HFA) 44 MCG/ACT inhaler, Inhale 2 puffs into the lungs as needed., Disp: , Rfl:  .  Magnesium Hydroxide (MILK OF MAGNESIA PO), Take by mouth., Disp: , Rfl:  .  valsartan-hydrochlorothiazide (DIOVAN-HCT) 160-12.5 MG per tablet, TAKE ONE TABLET BY MOUTH ONCE DAILY, Disp: 90 tablet, Rfl: 0 .  benzonatate (  TESSALON PERLES) 100 MG capsule, Take 1 capsule (100 mg total) by mouth 3 (three) times daily as needed., Disp: 20 capsule, Rfl: 0  EXAM:  Filed Vitals:   06/18/15 1105  BP: 142/74  Pulse: 90  Temp: 98.3 F (36.8 C)    Body mass index is 21.88 kg/(m^2).  GENERAL: vitals reviewed and listed above, alert, oriented, appears well hydrated and in no acute distress  HEENT: atraumatic, conjunttiva clear, no obvious abnormalities on inspection of external nose and ears, normal appearance of ear canals and TMs, clear nasal congestion, mild post oropharyngeal erythema with PND, no tonsillar edema or exudate, no sinus TTP  NECK: no obvious masses on inspection  LUNGS: clear to auscultation bilaterally, no wheezes, rales or rhonchi, good air movement  CV: HRRR, no peripheral edema  MS: moves all extremities without noticeable  abnormality  PSYCH: pleasant and cooperative, no obvious depression or anxiety  ASSESSMENT AND PLAN:  Discussed the following assessment and plan:  Essential hypertension -running a little high today -she opted to work on lifestyle and not change tx while not feeling well with cold -follow up in 1 month to recheck  HYPERLIPIDEMIA, MIXED -cont current tx -lifestyle recs  Viral upper respiratory illness -supportive care, tessalon -Patient advised to return or notify a doctor immediately if symptoms worsen or persist or new concerns arise.  Patient Instructions  Before you leave: -schedule a follow-up to recheck the blood pressure in about 1 month  In sure you're eating a healthy low sodium diet with lots of fruits and veggies. We recommend the following healthy lifestyle measures: - eat a healthy whole foods diet consisting of regular small meals composed of vegetables, fruits, beans, nuts, seeds, healthy meats such as white chicken and fish and whole grains.  - avoid sweets, white starchy foods, fried foods, fast food, processed foods, sodas, red meet and other fattening foods.  - get a least 150-300 minutes of aerobic exercise per week.   INSTRUCTIONS FOR UPPER RESPIRATORY INFECTION:  -plenty of rest and fluids  -nasal saline wash 2-3 times daily (use prepackaged nasal saline or bottled/distilled water if making your own)   -can use AFRIN nasal spray for drainage and nasal congestion - but do NOT use longer then 3-4 days  -can use tylenol (in no history of liver disease) or ibuprofen (if no history of kidney disease, bowel bleeding or significant heart disease) as directed for aches and sorethroat  -in the winter time, using a humidifier at night is helpful (please follow cleaning instructions)  -if you are taking a cough medication - use only as directed, may also try a teaspoon of honey to coat the throat and throat lozenges.  -for sore throat, salt water gargles can  help  -follow up if you have fevers, facial pain, tooth pain, difficulty breathing or are worsening or symptoms persist longer then expected  Upper Respiratory Infection, Adult An upper respiratory infection (URI) is also known as the common cold. It is often caused by a type of germ (virus). Colds are easily spread (contagious). You can pass it to others by kissing, coughing, sneezing, or drinking out of the same glass. Usually, you get better in 1 to 3  weeks.  However, the cough can last for even longer. HOME CARE   Only take medicine as told by your doctor. Follow instructions provided above.  Drink enough water and fluids to keep your pee (urine) clear or pale yellow.  Get plenty of rest.  Return to  work when your temperature is < 100 for 24 hours or as told by your doctor. You may use a face mask and wash your hands to stop your cold from spreading. GET HELP RIGHT AWAY IF:   After the first few days, you feel you are getting worse.  You have questions about your medicine.  You have chills, shortness of breath, or red spit (mucus).  You have pain in the face for more then 1-2 days, especially when you bend forward.  You have a fever, puffy (swollen) neck, pain when you swallow, or white spots in the back of your throat.  You have a bad headache, ear pain, sinus pain, or chest pain.  You have a high-pitched whistling sound when you breathe in and out (wheezing).  You cough up blood.  You have sore muscles or a stiff neck. MAKE SURE YOU:   Understand these instructions.  Will watch your condition.  Will get help right away if you are not doing well or get worse. Document Released: 07/16/2007 Document Revised: 04/21/2011 Document Reviewed: 05/04/2013 [facility] Patient Information 2015 [place], [place]. This information is not intended to replace advice given to you by your health care provider. Make sure you discuss any questions you have with your health care  provider.        Colin Benton R.

## 2015-06-18 NOTE — Progress Notes (Signed)
Pre visit review using our clinic review tool, if applicable. No additional management support is needed unless otherwise documented below in the visit note. 

## 2015-06-21 ENCOUNTER — Ambulatory Visit: Payer: Medicare Other

## 2015-06-21 ENCOUNTER — Other Ambulatory Visit: Payer: Medicare Other

## 2015-07-19 ENCOUNTER — Ambulatory Visit: Payer: Medicare Other | Admitting: Family Medicine

## 2015-07-24 ENCOUNTER — Ambulatory Visit: Payer: Medicare Other | Admitting: Family Medicine

## 2015-07-26 ENCOUNTER — Ambulatory Visit: Payer: Medicare Other | Admitting: Family Medicine

## 2015-07-27 ENCOUNTER — Ambulatory Visit: Payer: Medicare Other | Admitting: Family Medicine

## 2015-07-28 ENCOUNTER — Other Ambulatory Visit: Payer: Self-pay | Admitting: Family Medicine

## 2015-07-30 ENCOUNTER — Ambulatory Visit: Payer: Medicare Other | Admitting: Family Medicine

## 2015-08-08 ENCOUNTER — Ambulatory Visit: Payer: Medicare Other | Admitting: Family Medicine

## 2015-08-15 ENCOUNTER — Ambulatory Visit: Payer: Medicare Other | Admitting: Family Medicine

## 2015-08-21 ENCOUNTER — Ambulatory Visit: Payer: Medicare Other | Admitting: Family Medicine

## 2015-08-23 ENCOUNTER — Other Ambulatory Visit: Payer: Self-pay | Admitting: Pulmonary Disease

## 2015-09-01 ENCOUNTER — Other Ambulatory Visit: Payer: Self-pay | Admitting: Family Medicine

## 2015-09-04 ENCOUNTER — Other Ambulatory Visit: Payer: Self-pay | Admitting: *Deleted

## 2015-09-04 MED ORDER — ATENOLOL 50 MG PO TABS: 50.0000 mg | ORAL_TABLET | Freq: Every day | ORAL | 0 refills | Status: DC

## 2015-09-04 NOTE — Telephone Encounter (Signed)
Rx sent to [facility] in [place] per the pts request due to [facility] stating the Rx is on back order.  I called [facility] and the pharmacist stated the Rx is on national back order but they do have some and I called the pt and informed her of this.

## 2015-09-05 ENCOUNTER — Other Ambulatory Visit: Payer: Self-pay | Admitting: Pulmonary Disease

## 2015-09-05 ENCOUNTER — Other Ambulatory Visit: Payer: Self-pay | Admitting: Family Medicine

## 2015-09-13 ENCOUNTER — Other Ambulatory Visit: Payer: Self-pay | Admitting: *Deleted

## 2015-09-13 MED ORDER — VALSARTAN-HYDROCHLOROTHIAZIDE 160-12.5 MG PO TABS: 1.0000 | ORAL_TABLET | Freq: Every day | ORAL | 1 refills | Status: DC

## 2015-09-13 NOTE — Telephone Encounter (Signed)
Rx done. 

## 2015-11-05 DIAGNOSIS — Z23 Encounter for immunization: Secondary | ICD-10-CM | POA: Diagnosis not present

## 2015-12-25 ENCOUNTER — Other Ambulatory Visit: Payer: Self-pay | Admitting: Pulmonary Disease

## 2015-12-30 ENCOUNTER — Other Ambulatory Visit: Payer: Self-pay | Admitting: Pulmonary Disease

## 2016-01-08 ENCOUNTER — Other Ambulatory Visit: Payer: Self-pay | Admitting: Pulmonary Disease

## 2016-01-09 ENCOUNTER — Other Ambulatory Visit: Payer: Self-pay | Admitting: Pulmonary Disease

## 2016-01-11 ENCOUNTER — Other Ambulatory Visit: Payer: Self-pay | Admitting: Pulmonary Disease

## 2016-01-16 ENCOUNTER — Other Ambulatory Visit: Payer: Self-pay | Admitting: Pulmonary Disease

## 2016-01-18 ENCOUNTER — Other Ambulatory Visit: Payer: Self-pay | Admitting: Pulmonary Disease

## 2016-02-28 ENCOUNTER — Other Ambulatory Visit: Payer: Self-pay | Admitting: Pulmonary Disease

## 2016-04-09 ENCOUNTER — Other Ambulatory Visit: Payer: Self-pay | Admitting: Family Medicine

## 2016-04-09 ENCOUNTER — Other Ambulatory Visit: Payer: Self-pay | Admitting: Pulmonary Disease

## 2016-04-10 ENCOUNTER — Other Ambulatory Visit: Payer: Self-pay | Admitting: Family Medicine

## 2016-04-10 ENCOUNTER — Other Ambulatory Visit: Payer: Self-pay | Admitting: Pulmonary Disease

## 2016-05-03 ENCOUNTER — Other Ambulatory Visit: Payer: Self-pay | Admitting: Pulmonary Disease

## 2016-05-08 ENCOUNTER — Other Ambulatory Visit: Payer: Self-pay | Admitting: Family Medicine

## 2016-05-08 ENCOUNTER — Other Ambulatory Visit: Payer: Self-pay

## 2016-05-08 ENCOUNTER — Other Ambulatory Visit: Payer: Self-pay | Admitting: Pulmonary Disease

## 2016-05-12 ENCOUNTER — Other Ambulatory Visit: Payer: Self-pay | Admitting: Pulmonary Disease

## 2016-05-13 ENCOUNTER — Other Ambulatory Visit: Payer: Self-pay | Admitting: Family Medicine

## 2016-05-14 ENCOUNTER — Other Ambulatory Visit: Payer: Self-pay | Admitting: Pulmonary Disease

## 2016-05-15 ENCOUNTER — Other Ambulatory Visit: Payer: Self-pay | Admitting: Family Medicine

## 2016-05-16 ENCOUNTER — Other Ambulatory Visit: Payer: Self-pay | Admitting: Pulmonary Disease

## 2016-05-17 ENCOUNTER — Other Ambulatory Visit: Payer: Self-pay | Admitting: Pulmonary Disease

## 2016-05-19 ENCOUNTER — Other Ambulatory Visit: Payer: Self-pay | Admitting: Pulmonary Disease

## 2016-05-22 ENCOUNTER — Other Ambulatory Visit: Payer: Self-pay | Admitting: *Deleted

## 2016-05-22 MED ORDER — VALSARTAN-HYDROCHLOROTHIAZIDE 160-12.5 MG PO TABS: 1.0000 | ORAL_TABLET | Freq: Every day | ORAL | 0 refills | Status: DC

## 2016-05-22 NOTE — Telephone Encounter (Signed)
Rx done for 30 day supply-pt is overdue for follow up visit (due 07/2015).

## 2016-07-22 ENCOUNTER — Other Ambulatory Visit: Payer: Self-pay | Admitting: Pulmonary Disease

## 2016-07-22 ENCOUNTER — Other Ambulatory Visit: Payer: Self-pay | Admitting: Family Medicine

## 2016-07-26 ENCOUNTER — Other Ambulatory Visit: Payer: Self-pay | Admitting: Family Medicine

## 2016-07-26 ENCOUNTER — Other Ambulatory Visit: Payer: Self-pay | Admitting: Pulmonary Disease

## 2016-07-27 ENCOUNTER — Other Ambulatory Visit: Payer: Self-pay | Admitting: Pulmonary Disease

## 2016-07-28 NOTE — Telephone Encounter (Signed)
Denied 90 day request.  Filled on 07/22/17 for #30 until seen on 07/29/16.  Can fill at [facility]

## 2016-07-29 ENCOUNTER — Other Ambulatory Visit: Payer: Self-pay

## 2016-07-29 ENCOUNTER — Ambulatory Visit (INDEPENDENT_AMBULATORY_CARE_PROVIDER_SITE_OTHER): Payer: PPO | Admitting: Family Medicine

## 2016-07-29 ENCOUNTER — Encounter: Payer: Self-pay | Admitting: Family Medicine

## 2016-07-29 VITALS — BP 128/64 | HR 64 | Temp 98.3°F | Wt 125.1 lb

## 2016-07-29 DIAGNOSIS — I10 Essential (primary) hypertension: Secondary | ICD-10-CM | POA: Diagnosis not present

## 2016-07-29 MED ORDER — AMLODIPINE BESYLATE 5 MG PO TABS: 5.0000 mg | ORAL_TABLET | Freq: Every day | ORAL | 3 refills | Status: DC

## 2016-07-29 MED ORDER — ATENOLOL 50 MG PO TABS: 50.0000 mg | ORAL_TABLET | Freq: Every day | ORAL | 5 refills | Status: DC

## 2016-07-29 MED ORDER — VALSARTAN-HYDROCHLOROTHIAZIDE 160-12.5 MG PO TABS: 1.0000 | ORAL_TABLET | Freq: Every day | ORAL | 2 refills | Status: DC

## 2016-07-29 NOTE — Patient Instructions (Addendum)
BEFORE YOU LEAVE: -AWV on day Dr. Maudie Mercury is in offfice asap -follow up: 4 months with Dr. Maudie Mercury  Continue current medications.  Advise regular aerobic exercise (at least 150 minutes per week of sweaty exercise) and a healthy diet. Try to eat at least 5-9 servings of vegetables and fruits per day (not corn, potatoes or bananas.) Avoid sweets, red meat, pork, butter, fried foods, fast food, processed food, excessive dairy, eggs and coconut. Replace bad fats with good fats - fish, nuts and seeds, canola oil, olive oil.

## 2016-07-29 NOTE — Progress Notes (Signed)
HPI:  Follow up hypertension. Walking more, eating healthier and resigned from work. Reports feels great. No CP, SOB, HA, swelling. Needs refills on medications.  ROS: See pertinent positives and negatives per HPI.  Past Medical History:  Diagnosis Date  . Allergy   . Cataract   . Chronic bronchitis ([place]) 10/15/2006   Qualifier: Diagnosis of  By: Lenna Gilford MD, [place], ADENOMATOUS 10/15/2006   Qualifier: Diagnosis of  By: Lenna Gilford MD, [place] DISEASE 09/28/2007   Qualifier: Diagnosis of  By: Lenna Gilford MD, [place] 09/28/2007   Qualifier: Diagnosis of  By: Lenna Gilford MD, Deborra Medina   . GERD (gastroesophageal reflux disease)   . HEMORRHOIDS, INTERNAL 10/15/2006   Qualifier: Diagnosis of  By: Lenna Gilford MD, Deborra Medina   . Hyperlipidemia   . Hypertension   . Irritable bowel syndrome 10/15/2006   Qualifier: Diagnosis of  By: Lenna Gilford MD, Deborra Medina     Past Surgical History:  Procedure Laterality Date  . COLONOSCOPY    . DENTAL SURGERY    . POLYPECTOMY    . VAGINAL HYSTERECTOMY      Family History  Problem Relation Age of Onset  . Heart disease Mother   . Kidney disease Father     Social History   Social History  . Marital status: Widowed    Spouse name: N/A  . Number of children: N/A  . Years of education: N/A   Social History Main Topics  . Smoking status: Never Smoker  . Smokeless tobacco: Never Used  . Alcohol use No  . Drug use: No  . Sexual activity: Not Asked   Other Topics Concern  . 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  acetaminophen (TYLENOL) 500 MG tablet, Take 500 mg by mouth every 6 (six) hours as needed., Disp: , Rfl:  .  aspirin 81 MG EC tablet, Take 81 mg by mouth daily.  , Disp: , Rfl:  .   atorvastatin (LIPITOR) 20 MG tablet, TAKE ONE TABLET BY MOUTH ONCE DAILY, Disp: 90 tablet, Rfl: 3 .  benzonatate (TESSALON PERLES) 100 MG capsule, Take 1 capsule (100 mg total) by mouth 3 (three) times daily as needed., Disp: 20 capsule, Rfl: 0 .  Bismuth Subsalicylate (PEPTO-BISMOL PO), Take by mouth., Disp: , Rfl:  .  Coenzyme Q10 (CO Q-10 PO), Take by mouth daily., Disp: , Rfl:  .  fluticasone (FLONASE) 50 MCG/ACT nasal spray, USE TWO SPRAY(S) IN EACH NOSTRIL ONCE DAILY, Disp: 16 g, Rfl: 3 .  fluticasone (FLOVENT HFA) 44 MCG/ACT inhaler, Inhale 2 puffs into the lungs as needed., Disp: , Rfl:  .  Magnesium Hydroxide (MILK OF MAGNESIA PO), Take by mouth., Disp: , Rfl:  .  amLODipine (NORVASC) 5 MG tablet, Take 1 tablet (5 mg total) by mouth daily., Disp: 90 tablet, Rfl: 3 .  atenolol (TENORMIN) 50 MG tablet, Take 1 tablet (50 mg total) by mouth daily., Disp: 30 tablet, Rfl: 5 .  valsartan-hydrochlorothiazide (DIOVAN-HCT) 160-12.5 MG tablet, Take 1 tablet by mouth daily., Disp: 90 tablet, Rfl: 2  EXAM:  Vitals:  07/29/16 1438  BP: 128/64  Pulse: 64  Temp: 98.3 F (36.8 C)    Body mass index is 23.07 kg/m.  GENERAL: vitals reviewed and listed above, alert, oriented, appears well hydrated and in no acute distress  HEENT: atraumatic, conjunttiva clear, no obvious abnormalities on inspection of external nose and ears  NECK: no obvious masses on inspection  LUNGS: clear to auscultation bilaterally, no wheezes, rales or rhonchi, good air movement  CV: HRRR, no peripheral edema  MS: moves all extremities without noticeable abnormality  PSYCH: pleasant and cooperative, no obvious depression or anxiety  ASSESSMENT AND PLAN:  Discussed the following assessment and plan:  Essential hypertension  -BP much better -congratulated on healthy changes -will have assistant refill medications -follow up 4 months -Patient advised to return or notify a doctor immediately if symptoms worsen  or persist or new concerns arise.  Patient Instructions  BEFORE YOU LEAVE: -AWV on day Dr. Maudie Mercury is in offfice asap -follow up: 4 months with Dr. Maudie Mercury  Continue current medications.  Advise regular aerobic exercise (at least 150 minutes per week of sweaty exercise) and a healthy diet. Try to eat at least 5-9 servings of vegetables and fruits per day (not corn, potatoes or bananas.) Avoid sweets, red meat, pork, butter, fried foods, fast food, processed food, excessive dairy, eggs and coconut. Replace bad fats with good fats - fish, nuts and seeds, canola oil, olive oil.      Colin Benton R., DO

## 2016-09-01 ENCOUNTER — Telehealth: Payer: Self-pay | Admitting: *Deleted

## 2016-09-01 NOTE — Telephone Encounter (Signed)
Dr Maudie Mercury received a fax from [facility] ([phone]) stating the Valsartan/HCTZ 160-12.5mg  is on recall and asked for a substitution.  Dr Maudie Mercury advised that I call the pt and if the pharmacy cannot provide a good product, she advised the pt come in for an appt to change.  I was unable to reach the pt and I called the pharmacy and Sam stated a comparable Rx would be Losartan HCT 50/12.5mg  and the message was forwarded to Dr Maudie Mercury.

## 2016-09-01 NOTE — Telephone Encounter (Signed)
Ok to make this change and notify pt.

## 2016-09-02 MED ORDER — LOSARTAN POTASSIUM-HCTZ 50-12.5 MG PO TABS: 1.0000 | ORAL_TABLET | Freq: Every day | ORAL | 1 refills | Status: DC

## 2016-09-02 NOTE — Telephone Encounter (Signed)
Rx sent and I called the pt and informed her of this.

## 2016-11-12 ENCOUNTER — Other Ambulatory Visit: Payer: Self-pay | Admitting: Family Medicine

## 2016-11-20 DIAGNOSIS — Z23 Encounter for immunization: Secondary | ICD-10-CM | POA: Diagnosis not present

## 2016-11-22 ENCOUNTER — Other Ambulatory Visit: Payer: Self-pay | Admitting: Family Medicine

## 2016-11-27 ENCOUNTER — Other Ambulatory Visit: Payer: Self-pay | Admitting: Family Medicine

## 2016-12-01 NOTE — Progress Notes (Signed)
HPI:  Follow up: Due for flu shot, PPSV23, AWV, labs - she reports had her flu shot and pneumonia shot recently at urgent care where she lives. She reports she is doing well and is without any complaints today. Reports she tries to stay active with walking and tries to eat healthy. Denies chest pain, shortness of breath, swelling, headaches.  HTN: -meds:asa, norvasc 5, atenolol 50 (from prior PCP), losartan-hctz  Hyperlipidemia: -meds: atorvastatin  Seasonal allergies/asthma: -meds: flovent, flonase,   ROS: See pertinent positives and negatives per HPI.  Past Medical History:  Diagnosis Date  . Allergy   . Cataract   . Chronic bronchitis ([place]) 10/15/2006   Qualifier: Diagnosis of  By: Lenna Gilford MD, [place], ADENOMATOUS 10/15/2006   Qualifier: Diagnosis of  By: Lenna Gilford MD, [place] DISEASE 09/28/2007   Qualifier: Diagnosis of  By: Lenna Gilford MD, [place] 09/28/2007   Qualifier: Diagnosis of  By: Lenna Gilford MD, Deborra Medina   . GERD (gastroesophageal reflux disease)   . HEMORRHOIDS, INTERNAL 10/15/2006   Qualifier: Diagnosis of  By: Lenna Gilford MD, Deborra Medina   . Hyperlipidemia   . Hypertension   . Irritable bowel syndrome 10/15/2006   Qualifier: Diagnosis of  By: Lenna Gilford MD, Deborra Medina     Past Surgical History:  Procedure Laterality Date  . COLONOSCOPY    . DENTAL SURGERY    . POLYPECTOMY    . VAGINAL HYSTERECTOMY      Family History  Problem Relation Age of Onset  . Heart disease Mother   . Kidney disease Father     Social History   Social History  . Marital status: Widowed    Spouse name: N/A  . Number of children: N/A  . Years of education: N/A   Social History Main Topics  . Smoking status: Never Smoker  . Smokeless tobacco: Never Used  . Alcohol use No  . Drug use: No  . Sexual activity: Not Asked   Other Topics Concern  . 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  acetaminophen (TYLENOL) 500 MG tablet, Take 500 mg by mouth every 6 (six) hours as needed., Disp: , Rfl:  .  amLODipine (NORVASC) 5 MG tablet, Take 1 tablet (5 mg total) by mouth daily., Disp: 90 tablet, Rfl: 3 .  aspirin 81 MG EC tablet, Take 81 mg by mouth daily.  , Disp: , Rfl:  .  atenolol (TENORMIN) 50 MG tablet, Take 1 tablet (50 mg total) by mouth daily., Disp: 30 tablet, Rfl: 5 .  atorvastatin (LIPITOR) 20 MG tablet, TAKE ONE TABLET BY MOUTH ONCE DAILY, Disp: 90 tablet, Rfl: 0 .  Bismuth Subsalicylate (PEPTO-BISMOL PO), Take by mouth., Disp: , Rfl:  .  Coenzyme Q10 (CO Q-10 PO), Take by mouth daily., Disp: , Rfl:  .  fluticasone (FLONASE) 50 MCG/ACT nasal spray, USE TWO SPRAY(S) IN EACH NOSTRIL ONCE DAILY, Disp: 16 g, Rfl: 3 .  fluticasone (FLOVENT HFA) 44 MCG/ACT inhaler, Inhale 2 puffs into the lungs as needed., Disp: , Rfl:  .  losartan-hydrochlorothiazide (HYZAAR) 50-12.5 MG tablet, Take 1  tablet by mouth daily., Disp: 90 tablet, Rfl: 1 .  Magnesium Hydroxide (MILK OF MAGNESIA PO), Take by mouth., Disp: , Rfl:   EXAM:  Vitals:   12/02/16 1402  BP: (!) 120/58  Pulse: 69  Temp: 98.2 F (36.8 C)    Body mass index is 23.16 kg/m.  GENERAL: vitals reviewed and listed above, alert, oriented, appears well hydrated and in no acute distress  HEENT: atraumatic, conjunttiva clear, no obvious abnormalities on inspection of external nose and ears  NECK: no obvious masses on inspection  LUNGS: clear to auscultation bilaterally, no wheezes, rales or rhonchi, good air movement  CV: HRRR, no peripheral edema  MS: moves all extremities without noticeable abnormality  PSYCH: pleasant and cooperative, no obvious depression or anxiety  ASSESSMENT AND PLAN:  Discussed the following assessment and plan:  Essential  hypertension - Plan: Basic metabolic panel, CBC  HYPERLIPIDEMIA, MIXED  -lifestyle recommendations -Blood pressure labs -She already did her flu and pneumonia shots elsewhere -advised Annual wellness visit and fasting labs at her next visit -Patient advised to return or notify a doctor immediately if symptoms worsen or persist or new concerns arise.  Patient Instructions  BEFORE YOU LEAVE: -labs -follow up: AWV with Manuela Schwartz and follow up with Dr. Maudie Mercury same day in 3-4 months - fasting labs that day  We have ordered labs or studies at this visit. It can take up to 1-2 weeks for results and processing. IF results require follow up or explanation, we will call you with instructions. Clinically stable results will be released to your [facility]. If you have not heard from [place] or cannot find your results in [facility] in 2 weeks please contact our office at [phone].  If you are not yet signed up for [facility], please consider signing up.  Continue current medications.  Advise regular aerobic exercise (at least 150 minutes per week of sweaty exercise) and a healthy diet. Try to eat at least 5-9 servings of vegetables and fruits per day (not corn, potatoes or bananas.) Avoid sweets, red meat, pork, butter, fried foods, fast food, processed food, excessive dairy, eggs and coconut. Replace bad fats with good fats - fish, nuts and seeds, canola oil, olive oil.   WE NOW OFFER   [facility] FAST TRACK!!!  SAME DAY Appointments for ACUTE CARE  Such as: Sprains, Injuries, cuts, abrasions, rashes, muscle pain, joint pain, back pain Colds, flu, sore throats, headache, allergies, cough, fever  Ear pain, sinus and eye infections Abdominal pain, nausea, vomiting, diarrhea, upset stomach Animal/insect bites  3 Easy Ways to Schedule: Walk-In Scheduling Call in scheduling Mychart Sign-up: [email or web address]                 Colin Benton R., DO

## 2016-12-02 ENCOUNTER — Ambulatory Visit (INDEPENDENT_AMBULATORY_CARE_PROVIDER_SITE_OTHER): Payer: PPO | Admitting: Family Medicine

## 2016-12-02 ENCOUNTER — Encounter: Payer: Self-pay | Admitting: Family Medicine

## 2016-12-02 VITALS — BP 120/58 | HR 69 | Temp 98.2°F | Ht 61.75 in | Wt 125.6 lb

## 2016-12-02 DIAGNOSIS — E782 Mixed hyperlipidemia: Secondary | ICD-10-CM | POA: Diagnosis not present

## 2016-12-02 DIAGNOSIS — I10 Essential (primary) hypertension: Secondary | ICD-10-CM

## 2016-12-02 LAB — CBC
HCT: 39.9 % (ref 36.0–46.0)
Hemoglobin: 13.4 g/dL (ref 12.0–15.0)
MCHC: 33.6 g/dL (ref 30.0–36.0)
MCV: 96.3 fl (ref 78.0–100.0)
PLATELETS: 252 10*3/uL (ref 150.0–400.0)
RBC: 4.14 Mil/uL (ref 3.87–5.11)
RDW: 12.9 % (ref 11.5–15.5)
WBC: 6.6 10*3/uL (ref 4.0–10.5)

## 2016-12-02 LAB — BASIC METABOLIC PANEL
BUN: 15 mg/dL (ref 6–23)
CALCIUM: 9.5 mg/dL (ref 8.4–10.5)
CO2: 33 mEq/L — ABNORMAL HIGH (ref 19–32)
Chloride: 98 mEq/L (ref 96–112)
Creatinine, Ser: 0.84 mg/dL (ref 0.40–1.20)
GFR: 68.67 mL/min (ref >60.00)
GLUCOSE: 102 mg/dL — AB (ref 70–99)
Potassium: 3.9 mEq/L (ref 3.5–5.1)
SODIUM: 137 meq/L (ref 135–145)

## 2016-12-02 NOTE — Patient Instructions (Signed)
BEFORE YOU LEAVE: -labs -follow up: AWV with Manuela Schwartz and follow up with Dr. Maudie Mercury same day in 3-4 months - fasting labs that day  We have ordered labs or studies at this visit. It can take up to 1-2 weeks for results and processing. IF results require follow up or explanation, we will call you with instructions. Clinically stable results will be released to your [facility]. If you have not heard from [place] or cannot find your results in [facility] in 2 weeks please contact our office at [phone].  If you are not yet signed up for [facility], please consider signing up.  Continue current medications.  Advise regular aerobic exercise (at least 150 minutes per week of sweaty exercise) and a healthy diet. Try to eat at least 5-9 servings of vegetables and fruits per day (not corn, potatoes or bananas.) Avoid sweets, red meat, pork, butter, fried foods, fast food, processed food, excessive dairy, eggs and coconut. Replace bad fats with good fats - fish, nuts and seeds, canola oil, olive oil.   WE NOW OFFER    [facility] FAST TRACK!!!  SAME DAY Appointments for ACUTE CARE  Such as: Sprains, Injuries, cuts, abrasions, rashes, muscle pain, joint pain, back pain Colds, flu, sore throats, headache, allergies, cough, fever  Ear pain, sinus and eye infections Abdominal pain, nausea, vomiting, diarrhea, upset stomach Animal/insect bites  3 Easy Ways to Schedule: Walk-In Scheduling Call in scheduling Mychart Sign-up: [email or web address]

## 2016-12-11 ENCOUNTER — Other Ambulatory Visit: Payer: Self-pay | Admitting: Family Medicine

## 2016-12-15 DIAGNOSIS — I10 Essential (primary) hypertension: Secondary | ICD-10-CM | POA: Diagnosis not present

## 2017-01-14 DIAGNOSIS — R413 Other amnesia: Secondary | ICD-10-CM | POA: Diagnosis not present

## 2017-02-28 ENCOUNTER — Other Ambulatory Visit: Payer: Self-pay | Admitting: Family Medicine

## 2017-03-05 ENCOUNTER — Ambulatory Visit: Payer: PPO | Admitting: Family Medicine

## 2017-03-07 NOTE — Progress Notes (Signed)
HPI:  Rhonda Weaver is a pleasant 84 y.o. here for follow up. Chronic medical problems summarized below were reviewed for changes and stability and were updated as needed below. These issues and their treatment remain stable for the most part.  Denies CP, SOB, DOE, treatment intolerance or new symptoms.  Due for AWV  HTN: -meds:asa, norvasc 5, atenolol 50 (from prior PCP), losartan-hctz  Hyperlipidemia: -meds: atorvastatin  Seasonal allergies/asthma: -meds: flovent, flonase  ROS: See pertinent positives and negatives per HPI.  Past Medical History:  Diagnosis Date  . Allergy   . Cataract   . Chronic bronchitis ([place]) 10/15/2006   Qualifier: Diagnosis of  By: Lenna Gilford MD, [place], ADENOMATOUS 10/15/2006   Qualifier: Diagnosis of  By: Lenna Gilford MD, [place] DISEASE 09/28/2007   Qualifier: Diagnosis of  By: Lenna Gilford MD, [place] 09/28/2007   Qualifier: Diagnosis of  By: Lenna Gilford MD, Deborra Medina   . GERD (gastroesophageal reflux disease)   . HEMORRHOIDS, INTERNAL 10/15/2006   Qualifier: Diagnosis of  By: Lenna Gilford MD, Deborra Medina   . Hyperlipidemia   . Hypertension   . Irritable bowel syndrome 10/15/2006   Qualifier: Diagnosis of  By: Lenna Gilford MD, Deborra Medina     Past Surgical History:  Procedure Laterality Date  . COLONOSCOPY    . DENTAL SURGERY    . POLYPECTOMY    . VAGINAL HYSTERECTOMY      Family History  Problem Relation Age of Onset  . Heart disease Mother   . Kidney disease Father     Social History   Socioeconomic History  . Marital status: Widowed    Spouse name: None  . Number of children: None  . Years of education: None  . Highest education level: None  Social Needs  . Financial resource strain: None  . Food insecurity - worry: None  . Food insecurity - inability: None  . Transportation needs - medical: None  . Transportation needs - non-medical: None  Occupational History  . None  Tobacco Use  . Smoking  status: Never Smoker  . Smokeless tobacco: Never Used  Substance and Sexual Activity  . Alcohol use: No  . Drug use: No  . Sexual activity: None  Other Topics Concern  . 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  acetaminophen (TYLENOL) 500 MG tablet, Take 500 mg by mouth every 6 (six) hours as needed., Disp: , Rfl:  .  amLODipine (NORVASC) 5 MG tablet, Take 1 tablet (5 mg total) by mouth daily., Disp: 90 tablet, Rfl: 3 .  aspirin 81 MG EC tablet, Take 81 mg by mouth daily.  , Disp: , Rfl:  .  atenolol (TENORMIN) 50 MG tablet, TAKE 1 TABLET BY MOUTH ONCE DAILY, Disp: 30 tablet, Rfl: 5 .  atorvastatin (LIPITOR) 20 MG tablet, TAKE ONE TABLET BY MOUTH ONCE DAILY, Disp: 90 tablet, Rfl: 0 .  Bismuth Subsalicylate (PEPTO-BISMOL PO), Take by mouth., Disp: , Rfl:  .  Coenzyme Q10 (CO Q-10 PO), Take by mouth daily., Disp: , Rfl:  .  fluticasone (FLONASE) 50 MCG/ACT nasal spray, USE  TWO SPRAY(S) IN EACH NOSTRIL ONCE DAILY, Disp: 16 g, Rfl: 3 .  fluticasone (FLOVENT HFA) 44 MCG/ACT inhaler, Inhale 2 puffs into the lungs as needed., Disp: , Rfl:  .  losartan-hydrochlorothiazide (HYZAAR) 50-12.5 MG tablet, Take 1 tablet by mouth daily., Disp: 90 tablet, Rfl: 1 .  Magnesium Hydroxide (MILK OF MAGNESIA PO), Take by mouth., Disp: , Rfl:   EXAM:  Vitals:   03/09/17 0936  BP: 136/68  Pulse: 72  Temp: 98 F (36.7 C)    Body mass index is 23.18 kg/m.  GENERAL: vitals reviewed and listed above, alert, oriented, appears well hydrated and in no acute distress  HEENT: atraumatic, conjunttiva clear, no obvious abnormalities on inspection of external nose and ears  NECK: no obvious masses on inspection  LUNGS: clear to auscultation bilaterally, no wheezes, rales or rhonchi, good air  movement  CV: HRRR, no peripheral edema  MS: moves all extremities without noticeable abnormality  PSYCH: pleasant and cooperative, no obvious depression or anxiety  ASSESSMENT AND PLAN:  Discussed the following assessment and plan:  Essential hypertension - Plan: Basic metabolic panel, CBC  HYPERLIPIDEMIA, MIXED - Plan: Lipid panel  Mild persistent asthma, unspecified whether complicated  -she is fasting so will get labs today -do for AWV - advised to schedule in 1 month with recheck BP then -medication review and advised pill box -lifestyle recs -Patient advised to return or notify a doctor immediately if symptoms worsen or persist or new concerns arise.  Patient Instructions  BEFORE YOU LEAVE: -review medications to ensure med list is correct -labs -follow up:  AWV with Manuela Schwartz and follow up with Dr. Maudie Mercury about BP in 1 month (will go over labs then as well)  We have ordered labs or studies at this visit. It can take up to 1-2 weeks for results and processing. IF results require follow up or explanation, we will call you with instructions. Clinically stable results will be released to your [facility]. If you have not heard from [place] or cannot find your results in [facility] in 2 weeks please contact our office at [phone].  If you are not yet signed up for [facility], please consider signing up.  Take all medications every day. Please use a pill box.   We recommend the following healthy lifestyle for LIFE: 1) Small portions. But, make sure to get regular (at least 3 per day), healthy meals and small healthy snacks if needed.  2) Eat a healthy clean diet.   TRY TO EAT: -at least 5-7 servings of low sugar, colorful, and nutrient rich vegetables per day (not corn, potatoes or bananas.) -berries are the best choice if you wish to eat fruit (only eat small amounts if trying to reduce weight)  -lean meets (fish, white meat of chicken or [place]) -vegan proteins for some meals - beans  or tofu, whole grains, nuts and seeds -Replace bad fats with good fats - good fats include: fish, nuts and seeds, canola oil, olive oil -small amounts of low fat or non fat dairy -small amounts of100 % whole grains - check the lables -drink plenty of water  AVOID: -SUGAR, sweets, anything with added sugar, corn syrup or sweeteners - must read labels as even foods advertised as "healthy" often are loaded with sugar -if you must have a sweetener, small amounts of stevia may be best -sweetened beverages and artificially sweetened beverages -simple starches (rice, bread, potatoes, pasta, chips, etc - small amounts of 100% whole grains are ok) -red meat,  pork, butter -fried foods, fast food, processed food, excessive dairy, eggs and coconut.  3)Get at least 150 minutes of sweaty aerobic exercise per week.  4)Reduce stress - consider counseling, meditation and relaxation to balance other aspects of your life.          Lucretia Kern, DO

## 2017-03-09 ENCOUNTER — Encounter: Payer: Self-pay | Admitting: Family Medicine

## 2017-03-09 ENCOUNTER — Ambulatory Visit (INDEPENDENT_AMBULATORY_CARE_PROVIDER_SITE_OTHER): Payer: PPO | Admitting: Family Medicine

## 2017-03-09 VITALS — BP 136/68 | HR 72 | Temp 98.0°F | Ht 61.75 in | Wt 125.7 lb

## 2017-03-09 DIAGNOSIS — E782 Mixed hyperlipidemia: Secondary | ICD-10-CM

## 2017-03-09 DIAGNOSIS — J4530 Mild persistent asthma, uncomplicated: Secondary | ICD-10-CM | POA: Diagnosis not present

## 2017-03-09 DIAGNOSIS — I10 Essential (primary) hypertension: Secondary | ICD-10-CM

## 2017-03-09 LAB — LIPID PANEL
CHOLESTEROL: 189 mg/dL (ref 0–200)
HDL: 42.7 mg/dL (ref >39.00)
LDL Cholesterol: 110 mg/dL — ABNORMAL HIGH (ref 0–99)
NonHDL: 146.11
TRIGLYCERIDES: 179 mg/dL — AB (ref 0.0–149.0)
Total CHOL/HDL Ratio: 4
VLDL: 35.8 mg/dL (ref 0.0–40.0)

## 2017-03-09 LAB — BASIC METABOLIC PANEL
BUN: 12 mg/dL (ref 6–23)
CALCIUM: 9.4 mg/dL (ref 8.4–10.5)
CO2: 33 meq/L — AB (ref 19–32)
CREATININE: 0.95 mg/dL (ref 0.40–1.20)
Chloride: 100 mEq/L (ref 96–112)
GFR: 59.54 mL/min — AB (ref >60.00)
GLUCOSE: 129 mg/dL — AB (ref 70–99)
Potassium: 3.7 mEq/L (ref 3.5–5.1)
Sodium: 141 mEq/L (ref 135–145)

## 2017-03-09 LAB — CBC
HCT: 41.4 % (ref 36.0–46.0)
Hemoglobin: 14.1 g/dL (ref 12.0–15.0)
MCHC: 34.1 g/dL (ref 30.0–36.0)
MCV: 95.2 fl (ref 78.0–100.0)
Platelets: 246 10*3/uL (ref 150.0–400.0)
RBC: 4.36 Mil/uL (ref 3.87–5.11)
RDW: 13.4 % (ref 11.5–15.5)
WBC: 6.1 10*3/uL (ref 4.0–10.5)

## 2017-03-09 NOTE — Addendum Note (Signed)
Addended by: Agnes Lawrence on: 03/09/2017 09:54 AM   Modules accepted: Orders

## 2017-03-09 NOTE — Patient Instructions (Addendum)
BEFORE YOU LEAVE: -review medications to ensure med list is correct -labs -follow up:  AWV with Manuela Schwartz and follow up with Dr. Maudie Mercury about BP in 1 month (will go over labs then as well)  We have ordered labs or studies at this visit. It can take up to 1-2 weeks for results and processing. IF results require follow up or explanation, we will call you with instructions. Clinically stable results will be released to your [facility]. If you have not heard from [place] or cannot find your results in [facility] in 2 weeks please contact our office at [phone].  If you are not yet signed up for [facility], please consider signing up.  Take all medications every day. Please use a pill box.   We recommend the following healthy lifestyle for LIFE: 1) Small portions. But, make sure to get regular (at least 3 per day), healthy meals and small healthy snacks if needed.  2) Eat a healthy clean diet.   TRY TO EAT: -at least 5-7 servings of low sugar, colorful, and nutrient rich vegetables per day (not corn, potatoes or bananas.) -berries are the best choice if you wish to eat fruit (only eat small amounts if trying to reduce weight)  -lean meets (fish, white meat of chicken or [place]) -vegan proteins for some meals - beans or tofu, whole grains, nuts and seeds -Replace bad fats with good fats - good fats include: fish, nuts and seeds, canola oil, olive oil -small amounts of low fat or non fat dairy -small amounts of100 % whole grains - check the lables -drink plenty of water  AVOID: -SUGAR, sweets, anything with added sugar, corn syrup or sweeteners - must read labels as even foods advertised as "healthy" often are loaded with sugar -if you must have a sweetener, small amounts of stevia may be best -sweetened beverages and artificially sweetened beverages -simple starches (rice, bread, potatoes, pasta, chips, etc - small amounts of 100% whole grains are ok) -red meat, pork, butter -fried foods, fast food,  processed food, excessive dairy, eggs and coconut.  3)Get at least 150 minutes of sweaty aerobic exercise per week.  4)Reduce stress - consider counseling, meditation and relaxation to balance other aspects of your life.

## 2017-04-05 ENCOUNTER — Other Ambulatory Visit: Payer: Self-pay | Admitting: Family Medicine

## 2017-04-07 ENCOUNTER — Ambulatory Visit (INDEPENDENT_AMBULATORY_CARE_PROVIDER_SITE_OTHER): Payer: PPO

## 2017-04-07 ENCOUNTER — Ambulatory Visit (INDEPENDENT_AMBULATORY_CARE_PROVIDER_SITE_OTHER): Payer: PPO | Admitting: Family Medicine

## 2017-04-07 ENCOUNTER — Encounter: Payer: Self-pay | Admitting: Family Medicine

## 2017-04-07 VITALS — BP 150/80 | HR 72 | Temp 98.2°F | Ht 61.75 in | Wt 126.8 lb

## 2017-04-07 VITALS — BP 150/80 | HR 72 | Ht 61.75 in | Wt 126.0 lb

## 2017-04-07 DIAGNOSIS — I10 Essential (primary) hypertension: Secondary | ICD-10-CM | POA: Diagnosis not present

## 2017-04-07 DIAGNOSIS — E2839 Other primary ovarian failure: Secondary | ICD-10-CM | POA: Diagnosis not present

## 2017-04-07 DIAGNOSIS — R739 Hyperglycemia, unspecified: Secondary | ICD-10-CM | POA: Diagnosis not present

## 2017-04-07 DIAGNOSIS — Z0000 Encounter for general adult medical examination without abnormal findings: Secondary | ICD-10-CM

## 2017-04-07 MED ORDER — LOSARTAN POTASSIUM-HCTZ 100-12.5 MG PO TABS: 1.0000 | ORAL_TABLET | Freq: Every day | ORAL | 3 refills | Status: DC

## 2017-04-07 NOTE — Patient Instructions (Signed)
BEFORE YOU LEAVE: -follow up: 3 months  Stop your current dose of Hyzaar.  I sent a new prescription to the pharmacy of losartan-hydrochlorothiazide 100-12 0.5.  Please start this new dose and take your medication every day.  Please check with your pharmacy to ensure that this medication was not impacted by the recent blood pressure medication recalls.  Please eat a healthy low sugar diet and get regular aerobic exercise.

## 2017-04-07 NOTE — Progress Notes (Signed)
Agree with above.  They may follow-up sooner if they wish to discuss the cognitive issues and the screening. Lucretia Kern, DO

## 2017-04-07 NOTE — Progress Notes (Signed)
HPI:  ARDITH TEST is a pleasant 84 y.o. here for follow up on he blood pressure:  Annual wellness visit was 04/07/2017, today  HTN: -meds:asa, norvasc 5, atenolol 50 (from prior PCP), losartan-hctz -Denies chest pain, shortness of breath, swelling, headaches or trouble breathing  Hyperglycemia recent labs: -Reports she does eat a lot of bread and drinks Pepsi -Otherwise feels she eats pretty healthy -No regular exercise -No fatigue, polyuria or vision changes  ROS: See pertinent positives and negatives per HPI.  Past Medical History:  Diagnosis Date  . Allergy   . Cataract   . Chronic bronchitis ([place]) 10/15/2006   Qualifier: Diagnosis of  By: Lenna Gilford MD, [place], ADENOMATOUS 10/15/2006   Qualifier: Diagnosis of  By: Lenna Gilford MD, [place] DISEASE 09/28/2007   Qualifier: Diagnosis of  By: Lenna Gilford MD, [place] 09/28/2007   Qualifier: Diagnosis of  By: Lenna Gilford MD, Deborra Medina   . GERD (gastroesophageal reflux disease)   . HEMORRHOIDS, INTERNAL 10/15/2006   Qualifier: Diagnosis of  By: Lenna Gilford MD, Deborra Medina   . Hyperlipidemia   . Hypertension   . Irritable bowel syndrome 10/15/2006   Qualifier: Diagnosis of  By: Lenna Gilford MD, Deborra Medina     Past Surgical History:  Procedure Laterality Date  . COLONOSCOPY    . DENTAL SURGERY    . POLYPECTOMY    . VAGINAL HYSTERECTOMY      Family History  Problem Relation Age of Onset  . Heart disease Mother   . Kidney disease Father     Social History   Socioeconomic History  . Marital status: Widowed    Spouse name: None  . Number of children: None  . Years of education: None  . Highest education level: None  Social Needs  . Financial resource strain: None  . Food insecurity - worry: None  . Food insecurity - inability: None  . Transportation needs - medical: None  . Transportation needs - non-medical: None  Occupational History  . None  Tobacco Use  . Smoking status: Never  Smoker  . Smokeless tobacco: Never Used  Substance and Sexual Activity  . Alcohol use: No  . Drug use: No  . Sexual activity: None  Other Topics Concern  . 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  acetaminophen (TYLENOL) 500 MG tablet, Take 500 mg by mouth every 6 (six) hours as needed., Disp: , Rfl:  .  amLODipine (NORVASC) 5 MG tablet, Take 1 tablet (5 mg total) by mouth daily., Disp: 90 tablet, Rfl: 3 .  aspirin 81 MG EC tablet, Take 81 mg by mouth daily.  , Disp: , Rfl:  .  atenolol (TENORMIN) 50 MG tablet, TAKE 1 TABLET BY MOUTH ONCE DAILY, Disp: 30 tablet, Rfl: 5 .  atorvastatin (LIPITOR) 20 MG tablet, TAKE 1 TABLET BY MOUTH ONCE DAILY, Disp: 90 tablet, Rfl: 1 .  Bismuth Subsalicylate (PEPTO-BISMOL PO), Take by mouth., Disp: , Rfl:  .  fluticasone (FLONASE) 50 MCG/ACT nasal spray, USE TWO SPRAY(S) IN EACH NOSTRIL ONCE DAILY, Disp: 16 g, Rfl: 3 .  fluticasone (  FLOVENT HFA) 44 MCG/ACT inhaler, Inhale 2 puffs into the lungs as needed., Disp: , Rfl:  .  Magnesium Hydroxide (MILK OF MAGNESIA PO), Take by mouth., Disp: , Rfl:  .  losartan-hydrochlorothiazide (HYZAAR) 100-12.5 MG tablet, Take 1 tablet by mouth daily., Disp: 90 tablet, Rfl: 3  EXAM:  Vitals:   04/07/17 1348  BP: (!) 150/80  Pulse: 72  Temp: 98.2 F (36.8 C)    Body mass index is 23.38 kg/m.  GENERAL: vitals reviewed and listed above, alert, oriented, appears well hydrated and in no acute distress  HEENT: atraumatic, conjunttiva clear, no obvious abnormalities on inspection of external nose and ears  NECK: no obvious masses on inspection  LUNGS: clear to auscultation bilaterally, no wheezes, rales or rhonchi, good air movement  CV: HRRR, no peripheral edema  MS: moves all extremities without  noticeable abnormality  PSYCH: pleasant and cooperative, no obvious depression or anxiety  ASSESSMENT AND PLAN:  Discussed the following assessment and plan:  Essential hypertension  Hyperglycemia  -Discussed various treatment options for blood pressure and she prefers to increase the dose of the Hyzaar, a new prescription was sent to the pharmacy -Also discussed lifestyle changes -Advised a healthy low sugar diet and regular aerobic exercise -She will see Manuela Schwartz for her annual wellness visit today -Follow-up in 3 months -Patient advised to return sooner if new concerns arise.  Patient Instructions  BEFORE YOU LEAVE: -follow up: 3 months  Stop your current dose of Hyzaar.  I sent a new prescription to the pharmacy of losartan-hydrochlorothiazide 100-12 0.5.  Please start this new dose and take your medication every day.  Please check with your pharmacy to ensure that this medication was not impacted by the recent blood pressure medication recalls.  Please eat a healthy low sugar diet and get regular aerobic exercise.    Lucretia Kern, DO

## 2017-04-07 NOTE — Patient Instructions (Addendum)
Rhonda Weaver , Thank you for taking time to come for your Medicare Wellness Visit. I appreciate your ongoing commitment to your health goals. Please review the following plan we discussed and let me know if I can assist you in the future.   Shingrix is a vaccine for the prevention of Shingles in Adults 50 and older.  If you are on Medicare, you can request a prescription from your doctor to be filled at a pharmacy.  Please check with your benefits regarding applicable copays or out of pocket expenses.  The Shingrix is given in 2 vaccines approx 8 weeks apart. You must receive the 2nd dose prior to 6 months from receipt of the first.    These are the goals we discussed: Goals    . Patient Stated     Walk more and socialize with nice people        This is a list of the screening recommended for you and due dates:  Health Maintenance  Topic Date Due  . DEXA scan (bone density measurement)  12/25/1997  . Tetanus Vaccine  07/02/2023  . Flu Shot  Completed  . Pneumonia vaccines  Completed     Bone Densitometry Bone densitometry is an imaging test that uses a special X-ray to measure the amount of calcium and other minerals in your bones (bone density). This test is also known as a bone mineral density test or dual-energy X-ray absorptiometry (DXA). The test can measure bone density at your hip and your spine. It is similar to having a regular X-ray. You may have this test to:  Diagnose a condition that causes weak or thin bones (osteoporosis).  Predict your risk of a broken bone (fracture).  Determine how well osteoporosis treatment is working.  Tell a health care provider about:  Any allergies you have.  All medicines you are taking, including vitamins, herbs, eye drops, creams, and over-the-counter medicines.  Any problems you or family members have had with anesthetic medicines.  Any blood disorders you have.  Any surgeries you have had.  Any medical conditions you  have.  Possibility of pregnancy.  Any other medical test you had within the previous 14 days that used contrast material. What are the risks? Generally, this is a safe procedure. However, problems can occur and may include the following:  This test exposes you to a very small amount of radiation.  The risks of radiation exposure may be greater to unborn children.  What happens before the procedure?  Do not take any calcium supplements for 24 hours before having the test. You can otherwise eat and drink what you usually do.  Take off all metal jewelry, eyeglasses, dental appliances, and any other metal objects. What happens during the procedure?  You may lie on an exam table. There will be an X-ray generator below you and an imaging device above you.  Other devices, such as boxes or braces, may be used to position your body properly for the scan.  You will need to lie still while the machine slowly scans your body.  The images will show up on a computer monitor. What happens after the procedure? You may need more testing at a later time. This information is not intended to replace advice given to you by your health care provider. Make sure you discuss any questions you have with your health care provider. Document Released: 02/19/2004 Document Revised: 07/05/2015 Document Reviewed: 07/07/2013 Elsevier Interactive Patient Education  2018 [place]  Prevention in the Home Falls can cause injuries. They can happen to people of all ages. There are many things you can do to make your home safe and to help prevent falls. What can I do on the outside of my home?  Regularly fix the edges of walkways and driveways and fix any cracks.  Remove anything that might make you trip as you walk through a door, such as a raised step or threshold.  Trim any bushes or trees on the path to your home.  Use bright outdoor lighting.  Clear any walking paths of anything that might make  someone trip, such as rocks or tools.  Regularly check to see if handrails are loose or broken. Make sure that both sides of any steps have handrails.  Any raised decks and porches should have guardrails on the edges.  Have any leaves, snow, or ice cleared regularly.  Use sand or salt on walking paths during winter.  Clean up any spills in your garage right away. This includes oil or grease spills. What can I do in the bathroom?  Use night lights.  Install grab bars by the toilet and in the tub and shower. Do not use towel bars as grab bars.  Use non-skid mats or decals in the tub or shower.  If you need to sit down in the shower, use a plastic, non-slip stool.  Keep the floor dry. Clean up any water that spills on the floor as soon as it happens.  Remove soap buildup in the tub or shower regularly.  Attach bath mats securely with double-sided non-slip rug tape.  Do not have throw rugs and other things on the floor that can make you trip. What can I do in the bedroom?  Use night lights.  Make sure that you have a light by your bed that is easy to reach.  Do not use any sheets or blankets that are too big for your bed. They should not hang down onto the floor.  Have a firm chair that has side arms. You can use this for support while you get dressed.  Do not have throw rugs and other things on the floor that can make you trip. What can I do in the kitchen?  Clean up any spills right away.  Avoid walking on wet floors.  Keep items that you use a lot in easy-to-reach places.  If you need to reach something above you, use a strong step stool that has a grab bar.  Keep electrical cords out of the way.  Do not use floor polish or wax that makes floors slippery. If you must use wax, use non-skid floor wax.  Do not have throw rugs and other things on the floor that can make you trip. What can I do with my stairs?  Do not leave any items on the stairs.  Make sure that  there are handrails on both sides of the stairs and use them. Fix handrails that are broken or loose. Make sure that handrails are as long as the stairways.  Check any carpeting to make sure that it is firmly attached to the stairs. Fix any carpet that is loose or worn.  Avoid having throw rugs at the top or bottom of the stairs. If you do have throw rugs, attach them to the floor with carpet tape.  Make sure that you have a light switch at the top of the stairs and the bottom of the stairs. If you do  not have them, ask someone to add them for you. What else can I do to help prevent falls?  Wear shoes that: ? Do not have high heels. ? Have rubber bottoms. ? Are comfortable and fit you well. ? Are closed at the toe. Do not wear sandals.  If you use a stepladder: ? Make sure that it is fully opened. Do not climb a closed stepladder. ? Make sure that both sides of the stepladder are locked into place. ? Ask someone to hold it for you, if possible.  Clearly mark and make sure that you can see: ? Any grab bars or handrails. ? First and last steps. ? Where the edge of each step is.  Use tools that help you move around (mobility aids) if they are needed. These include: ? Canes. ? Walkers. ? Scooters. ? Crutches.  Turn on the lights when you go into a dark area. Replace any light bulbs as soon as they burn out.  Set up your furniture so you have a clear path. Avoid moving your furniture around.  If any of your floors are uneven, fix them.  If there are any pets around you, be aware of where they are.  Review your medicines with your doctor. Some medicines can make you feel dizzy. This can increase your chance of falling. Ask your doctor what other things that you can do to help prevent falls. This information is not intended to replace advice given to you by your health care provider. Make sure you discuss any questions you have with your health care provider. Document Released:  11/23/2008 Document Revised: 07/05/2015 Document Reviewed: 03/03/2014 Elsevier Interactive Patient Education  2018 [place] Maintenance, Female Adopting a healthy lifestyle and getting preventive care can go a long way to promote health and wellness. Talk with your health care provider about what schedule of regular examinations is right for you. This is a good chance for you to check in with your provider about disease prevention and staying healthy. In between checkups, there are plenty of things you can do on your own. Experts have done a lot of research about which lifestyle changes and preventive measures are most likely to keep you healthy. Ask your health care provider for more information. Weight and diet Eat a healthy diet  Be sure to include plenty of vegetables, fruits, low-fat dairy products, and lean protein.  Do not eat a lot of foods high in solid fats, added sugars, or salt.  Get regular exercise. This is one of the most important things you can do for your health. ? Most adults should exercise for at least 150 minutes each week. The exercise should increase your heart rate and make you sweat (moderate-intensity exercise). ? Most adults should also do strengthening exercises at least twice a week. This is in addition to the moderate-intensity exercise.  Maintain a healthy weight  Body mass index (BMI) is a measurement that can be used to identify possible weight problems. It estimates body fat based on height and weight. Your health care provider can help determine your BMI and help you achieve or maintain a healthy weight.  For females 35 years of age and older: ? A BMI below 18.5 is considered underweight. ? A BMI of 18.5 to 24.9 is normal. ? A BMI of 25 to 29.9 is considered overweight. ? A BMI of 30 and above is considered obese.  Watch levels of cholesterol and blood lipids  You should start having  your blood tested for lipids and cholesterol at 82  years of age, then have this test every 5 years.  You may need to have your cholesterol levels checked more often if: ? Your lipid or cholesterol levels are high. ? You are older than 82 years of age. ? You are at high risk for heart disease.  Cancer screening Lung Cancer  Lung cancer screening is recommended for adults 45-57 years old who are at high risk for lung cancer because of a history of smoking.  A yearly low-dose CT scan of the lungs is recommended for people who: ? Currently smoke. ? Have quit within the past 15 years. ? Have at least a 30-pack-year history of smoking. A pack year is smoking an average of one pack of cigarettes a day for 1 year.  Yearly screening should continue until it has been 15 years since you quit.  Yearly screening should stop if you develop a health problem that would prevent you from having lung cancer treatment.  Breast Cancer  Practice breast self-awareness. This means understanding how your breasts normally appear and feel.  It also means doing regular breast self-exams. Let your health care provider know about any changes, no matter how small.  If you are in your 20s or 30s, you should have a clinical breast exam (CBE) by a health care provider every 1-3 years as part of a regular health exam.  If you are 48 or older, have a CBE every year. Also consider having a breast X-ray (mammogram) every year.  If you have a family history of breast cancer, talk to your health care provider about genetic screening.  If you are at high risk for breast cancer, talk to your health care provider about having an MRI and a mammogram every year.  Breast cancer gene (BRCA) assessment is recommended for women who have family members with BRCA-related cancers. BRCA-related cancers include: ? Breast. ? Ovarian. ? Tubal. ? Peritoneal cancers.  Results of the assessment will determine the need for genetic counseling and BRCA1 and BRCA2 testing.  Cervical  Cancer Your health care provider may recommend that you be screened regularly for cancer of the pelvic organs (ovaries, uterus, and vagina). This screening involves a pelvic examination, including checking for microscopic changes to the surface of your cervix (Pap test). You may be encouraged to have this screening done every 3 years, beginning at age 38.  For women ages 4-65, health care providers may recommend pelvic exams and Pap testing every 3 years, or they may recommend the Pap and pelvic exam, combined with testing for human papilloma virus (HPV), every 5 years. Some types of HPV increase your risk of cervical cancer. Testing for HPV may also be done on women of any age with unclear Pap test results.  Other health care providers may not recommend any screening for nonpregnant women who are considered low risk for pelvic cancer and who do not have symptoms. Ask your health care provider if a screening pelvic exam is right for you.  If you have had past treatment for cervical cancer or a condition that could lead to cancer, you need Pap tests and screening for cancer for at least 20 years after your treatment. If Pap tests have been discontinued, your risk factors (such as having a new sexual partner) need to be reassessed to determine if screening should resume. Some women have medical problems that increase the chance of getting cervical cancer. In these cases, your health care  provider may recommend more frequent screening and Pap tests.  Colorectal Cancer  This type of cancer can be detected and often prevented.  Routine colorectal cancer screening usually begins at 82 years of age and continues through 82 years of age.  Your health care provider may recommend screening at an earlier age if you have risk factors for colon cancer.  Your health care provider may also recommend using home test kits to check for hidden blood in the stool.  A small camera at the end of a tube can be used to  examine your colon directly (sigmoidoscopy or colonoscopy). This is done to check for the earliest forms of colorectal cancer.  Routine screening usually begins at age 91.  Direct examination of the colon should be repeated every 5-10 years through 82 years of age. However, you may need to be screened more often if early forms of precancerous polyps or small growths are found.  Skin Cancer  Check your skin from head to toe regularly.  Tell your health care provider about any new moles or changes in moles, especially if there is a change in a mole's shape or color.  Also tell your health care provider if you have a mole that is larger than the size of a pencil eraser.  Always use sunscreen. Apply sunscreen liberally and repeatedly throughout the day.  Protect yourself by wearing long sleeves, pants, a wide-brimmed hat, and sunglasses whenever you are outside.  Heart disease, diabetes, and high blood pressure  High blood pressure causes heart disease and increases the risk of stroke. High blood pressure is more likely to develop in: ? People who have blood pressure in the high end of the normal range (130-139/85-89 mm Hg). ? People who are overweight or obese. ? People who are African American.  If you are 35-75 years of age, have your blood pressure checked every 3-5 years. If you are 34 years of age or older, have your blood pressure checked every year. You should have your blood pressure measured twice-once when you are at a hospital or clinic, and once when you are not at a hospital or clinic. Record the average of the two measurements. To check your blood pressure when you are not at a hospital or clinic, you can use: ? An automated blood pressure machine at a pharmacy. ? A home blood pressure monitor.  If you are between 48 years and 72 years old, ask your health care provider if you should take aspirin to prevent strokes.  Have regular diabetes screenings. This involves taking a  blood sample to check your fasting blood sugar level. ? If you are at a normal weight and have a low risk for diabetes, have this test once every three years after 82 years of age. ? If you are overweight and have a high risk for diabetes, consider being tested at a younger age or more often. Preventing infection Hepatitis B  If you have a higher risk for hepatitis B, you should be screened for this virus. You are considered at high risk for hepatitis B if: ? You were born in a country where hepatitis B is common. Ask your health care provider which countries are considered high risk. ? Your parents were born in a high-risk country, and you have not been immunized against hepatitis B (hepatitis B vaccine). ? You have HIV or AIDS. ? You use needles to inject street drugs. ? You live with someone who has hepatitis B. ? You  have had sex with someone who has hepatitis B. ? You get hemodialysis treatment. ? You take certain medicines for conditions, including cancer, organ transplantation, and autoimmune conditions.  Hepatitis C  Blood testing is recommended for: ? Everyone born from [birth year] through 1963. ? Anyone with known risk factors for hepatitis C.  Sexually transmitted infections (STIs)  You should be screened for sexually transmitted infections (STIs) including gonorrhea and chlamydia if: ? You are sexually active and are younger than 82 years of age. ? You are older than 82 years of age and your health care provider tells you that you are at risk for this type of infection. ? Your sexual activity has changed since you were last screened and you are at an increased risk for chlamydia or gonorrhea. Ask your health care provider if you are at risk.  If you do not have HIV, but are at risk, it may be recommended that you take a prescription medicine daily to prevent HIV infection. This is called pre-exposure prophylaxis (PrEP). You are considered at risk if: ? You are sexually active and  do not regularly use condoms or know the HIV status of your partner(s). ? You take drugs by injection. ? You are sexually active with a partner who has HIV.  Talk with your health care provider about whether you are at high risk of being infected with HIV. If you choose to begin PrEP, you should first be tested for HIV. You should then be tested every 3 months for as long as you are taking PrEP. Pregnancy  If you are premenopausal and you may become pregnant, ask your health care provider about preconception counseling.  If you may become pregnant, take 400 to 800 micrograms (mcg) of folic acid every day.  If you want to prevent pregnancy, talk to your health care provider about birth control (contraception). Osteoporosis and menopause  Osteoporosis is a disease in which the bones lose minerals and strength with aging. This can result in serious bone fractures. Your risk for osteoporosis can be identified using a bone density scan.  If you are 105 years of age or older, or if you are at risk for osteoporosis and fractures, ask your health care provider if you should be screened.  Ask your health care provider whether you should take a calcium or vitamin D supplement to lower your risk for osteoporosis.  Menopause may have certain physical symptoms and risks.  Hormone replacement therapy may reduce some of these symptoms and risks. Talk to your health care provider about whether hormone replacement therapy is right for you. Follow these instructions at home:  Schedule regular health, dental, and eye exams.  Stay current with your immunizations.  Do not use any tobacco products including cigarettes, chewing tobacco, or electronic cigarettes.  If you are pregnant, do not drink alcohol.  If you are breastfeeding, limit how much and how often you drink alcohol.  Limit alcohol intake to no more than 1 drink per day for nonpregnant women. One drink equals 12 ounces of beer, 5 ounces of  wine, or 1 ounces of hard liquor.  Do not use street drugs.  Do not share needles.  Ask your health care provider for help if you need support or information about quitting drugs.  Tell your health care provider if you often feel depressed.  Tell your health care provider if you have ever been abused or do not feel safe at home. This information is not intended to replace advice  given to you by your health care provider. Make sure you discuss any questions you have with your health care provider. Document Released: 08/12/2010 Document Revised: 07/05/2015 Document Reviewed: 10/31/2014 Elsevier Interactive Patient Education  [employer].

## 2017-04-07 NOTE — Progress Notes (Signed)
Subjective:   Rhonda Weaver is a 84 y.o. female who presents for Medicare Annual (Subsequent) preventive examination.  Reports health as  Lives alone but dtr oversees meds and finances  Still drives some   Diet:  Breakfast cereal and coffee Lunch; sandwich Supper goes to friends house  lipids 02/2017; chol 189; HDL 42; trig 179; LDL 110  A1c was 5 in 2009; Glucose elevated this year 129  Exercise Walking around the home   Health Maintenance Due  Topic Date Due  . DEXA SCAN  12/25/1997   Aged out mammogram Dexa - agrees to order as well as her dtr   Shingrix education given Cardiac Risk Factors include: advanced age (>35men, >9 women);dyslipidemia;hypertension     Objective:     Vitals: BP (!) 150/80   Pulse 72   Ht 5' 1.75" (1.568 m)   Wt 126 lb (57.2 kg)   BMI 23.23 kg/m   Body mass index is 23.23 kg/m.  Advanced Directives 04/07/2017 12/06/2013 12/06/2013  Does Patient Have a Medical Advance Directive? Yes Yes Yes  Type of Advance Directive - Living will Living will  Copy of [place] in Chart? - No - copy requested No - copy requested     Tobacco Social History   Tobacco Use  Smoking Status Never Smoker  Smokeless Tobacco Never Used     Counseling given: Yes   Clinical Intake:     Past Medical History:  Diagnosis Date  . Allergy   . Cataract   . Chronic bronchitis ([place]) 10/15/2006   Qualifier: Diagnosis of  By: Lenna Gilford MD, [employer], ADENOMATOUS 10/15/2006   Qualifier: Diagnosis of  By: Lenna Gilford MD, [place] DISEASE 09/28/2007   Qualifier: Diagnosis of  By: Lenna Gilford MD, [place] 09/28/2007   Qualifier: Diagnosis of  By: Lenna Gilford MD, Deborra Medina   . GERD (gastroesophageal reflux disease)   . HEMORRHOIDS, INTERNAL 10/15/2006   Qualifier: Diagnosis of  By: Lenna Gilford MD, Deborra Medina   . Hyperlipidemia   . Hypertension   . Irritable bowel syndrome 10/15/2006   Qualifier: Diagnosis of   By: Lenna Gilford MD, Deborra Medina    Past Surgical History:  Procedure Laterality Date  . COLONOSCOPY    . DENTAL SURGERY    . POLYPECTOMY    . VAGINAL HYSTERECTOMY     Family History  Problem Relation Age of Onset  . Heart disease Mother   . Kidney disease Father    Social History   Socioeconomic History  . Marital status: Widowed    Spouse name: Not on file  . Number of children: Not on file  . Years of education: Not on file  . Highest education level: Not on file  Social Needs  . Financial resource strain: Not on file  . Food insecurity - worry: Not on file  . Food insecurity - inability: Not on file  . Transportation needs - medical: Not on file  . Transportation needs - non-medical: Not on file  Occupational History  . Not on file  Tobacco Use  . Smoking status: Never Smoker  . Smokeless tobacco: Never Used  Substance and Sexual Activity  . Alcohol use: No  . Drug use: No  . Sexual activity: Not on file  Other Topics Concern  . 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 acetaminophen (TYLENOL) 500 MG tablet Take 500 mg by mouth every 6 (six) hours as needed.  Marland Kitchen amLODipine (NORVASC) 5 MG tablet Take 1 tablet (5 mg total) by mouth daily.  Marland Kitchen aspirin 81 MG EC tablet Take 81 mg by mouth daily.    Marland Kitchen atenolol (TENORMIN) 50 MG tablet TAKE 1 TABLET BY MOUTH ONCE DAILY  . atorvastatin (LIPITOR) 20 MG tablet TAKE 1 TABLET BY MOUTH ONCE DAILY  . Bismuth Subsalicylate (PEPTO-BISMOL PO) Take by mouth.  . fluticasone (FLONASE) 50 MCG/ACT nasal spray USE TWO SPRAY(S) IN EACH NOSTRIL ONCE DAILY  . fluticasone (FLOVENT HFA) 44 MCG/ACT inhaler Inhale 2 puffs into the lungs as needed.  Marland Kitchen losartan-hydrochlorothiazide (HYZAAR) 100-12.5 MG tablet  Take 1 tablet by mouth daily.  . Magnesium Hydroxide (MILK OF MAGNESIA PO) Take by mouth.  . [DISCONTINUED] atorvastatin (LIPITOR) 20 MG tablet TAKE ONE TABLET BY MOUTH ONCE DAILY  . [DISCONTINUED] losartan-hydrochlorothiazide (HYZAAR) 50-12.5 MG tablet TAKE 1 TABLET BY MOUTH ONCE DAILY   No facility-administered encounter medications on file as of 04/07/2017.     Activities of Daily Living In your present state of health, do you have any difficulty performing the following activities: 04/07/2017  Hearing? N  Vision? N  Difficulty concentrating or making decisions? Y  Walking or climbing stairs? N  Dressing or bathing? N  Doing errands, shopping? N  Preparing Food and eating ? N  Using the Toilet? N  In the past six months, have you accidently leaked urine? N  Do you have problems with loss of bowel control? N  Managing your Medications? N  Managing your Finances? N  Housekeeping or managing your Housekeeping? N  Some recent data might be hidden    Patient Care Team: Lucretia Kern, DO as PCP - General (Family Medicine)    Assessment:   This is a routine wellness examination for Rhonda Weaver.  Exercise Activities and Dietary recommendations Current Exercise Habits: Home exercise routine, Type of exercise: walking  Goals    . Patient Stated     Walk more and socialize with nice people        Fall Risk Fall Risk  04/07/2017 07/29/2016 02/14/2015 12/06/2013 03/21/2013  Falls in the past year? No No No No No     Depression Screen PHQ 2/9 Scores 04/07/2017 07/29/2016 02/14/2015 12/06/2013  PHQ - 2 Score 0 0 0 0     Cognitive Function MMSE - Mini Mental State Exam 04/07/2017  Orientation to time 1  Orientation to Place 5  Registration 3  Attention/ Calculation 1  Recall 0  Language- name 2 objects 2  Language- repeat 1  Language- follow 3 step command 3  Language- read & follow direction 1  Write a sentence 1  Copy design 1  Total score 19    Dtr assist with meds;  finances;  She is independent in self care No failure of independent living Does drive but limited Dtr oversees Failed clock, could not identify "hands' on watch      Immunization History  Administered Date(s) Administered  . Influenza Split 11/11/2010, 11/21/2011, 10/11/2012  . Influenza Whole 11/03/2007, 11/17/2008, 11/13/2009  . Influenza-Unspecified 11/05/2013, 11/10/2016  . Pneumococcal Conjugate-13 12/06/2013  . Pneumococcal Polysaccharide-23 11/10/2016  .  Tdap 07/01/2013      Screening Tests Health Maintenance  Topic Date Due  . DEXA SCAN  12/25/1997  . TETANUS/TDAP  07/02/2023  . INFLUENZA VACCINE  Completed  . PNA vac Low Risk Adult  Completed         Plan:      PCP Notes   Health Maintenance Agrees to dexa scan (dtr as well )  Educated regarding the shingrix  Abnormal Screens  BP per Dr. Maudie Mercury and med changes  MMSE 19/30 Difficulty with clock, difficulty with date; month, day Recall 0/3  Still drives but limited. Dtr invited to call and discuss results if she would like too    Referrals  Not at this time Did discuss the increased probability of failures in the home this year. Has a boyfriend whom she visits in the evening    Patient concerns; None  Nurse Concerns; As noted   Next PCP apt 07/07/17     I have personally reviewed and noted the following in the patient's chart:   . Medical and social history . Use of alcohol, tobacco or illicit drugs  . Current medications and supplements . Functional ability and status . Nutritional status . Physical activity . Advanced directives . List of other physicians . Hospitalizations, surgeries, and ER visits in previous 12 months . Vitals . Screenings to include cognitive, depression, and falls . Referrals and appointments  In addition, I have reviewed and discussed with patient certain preventive protocols, quality metrics, and best practice recommendations. A written personalized  care plan for preventive services as well as general preventive health recommendations were provided to patient.     Wynetta Fines, RN  04/07/2017

## 2017-04-08 NOTE — Progress Notes (Signed)
Aayan Haskew R Machi Whittaker, DO  

## 2017-07-07 ENCOUNTER — Ambulatory Visit (INDEPENDENT_AMBULATORY_CARE_PROVIDER_SITE_OTHER): Payer: PPO | Admitting: Family Medicine

## 2017-07-07 ENCOUNTER — Encounter: Payer: Self-pay | Admitting: Family Medicine

## 2017-07-07 VITALS — BP 128/70 | HR 76 | Temp 98.2°F | Ht 61.75 in | Wt 125.6 lb

## 2017-07-07 DIAGNOSIS — I10 Essential (primary) hypertension: Secondary | ICD-10-CM

## 2017-07-07 DIAGNOSIS — R739 Hyperglycemia, unspecified: Secondary | ICD-10-CM | POA: Diagnosis not present

## 2017-07-07 LAB — BASIC METABOLIC PANEL
BUN: 10 mg/dL (ref 6–23)
CALCIUM: 9.5 mg/dL (ref 8.4–10.5)
CO2: 30 meq/L (ref 19–32)
CREATININE: 0.91 mg/dL (ref 0.40–1.20)
Chloride: 98 mEq/L (ref 96–112)
GFR: 62.52 mL/min (ref >60.00)
Glucose, Bld: 102 mg/dL — ABNORMAL HIGH (ref 70–99)
Potassium: 4.1 mEq/L (ref 3.5–5.1)
Sodium: 137 mEq/L (ref 135–145)

## 2017-07-07 LAB — HEMOGLOBIN A1C: Hgb A1c MFr Bld: 6 % (ref 4.6–6.5)

## 2017-07-07 NOTE — Patient Instructions (Signed)
BEFORE YOU LEAVE: -labs -follow up: about 3-4 months  We have ordered labs or studies at this visit. It can take up to 1-2 weeks for results and processing. IF results require follow up or explanation, we will call you with instructions. Clinically stable results will be released to your [facility]. If you have not heard from [place] or cannot find your results in [facility] in 2 weeks please contact our office at [phone].  If you are not yet signed up for [facility], please consider signing up.  Continue your current medications.   We recommend the following healthy lifestyle for LIFE: 1) Small portions. But, make sure to get regular (at least 3 per day), healthy meals and small healthy snacks if needed.  2) Eat a healthy clean diet.   TRY TO EAT: -at least 5-7 servings of low sugar, colorful, and nutrient rich vegetables per day (not corn, potatoes or bananas.) -berries are the best choice if you wish to eat fruit (only eat small amounts if trying to reduce weight)  -lean meets (fish, white meat of chicken or [place]) -vegan proteins for some meals - beans or tofu, whole grains, nuts and seeds -Replace bad fats with good fats - good fats include: fish, nuts and seeds, canola oil, olive oil -small amounts of low fat or non fat dairy -small amounts of100 % whole grains - check the lables -drink plenty of water  AVOID: -SUGAR, sweets, anything with added sugar, corn syrup or sweeteners - must read labels as even foods advertised as "healthy" often are loaded with sugar -if you must have a sweetener, small amounts of stevia may be best -sweetened beverages and artificially sweetened beverages -simple starches (rice, bread, potatoes, pasta, chips, etc - small amounts of 100% whole grains are ok) -red meat, pork, butter -fried foods, fast food, processed food, excessive dairy, eggs and coconut.  3)Get at least 150 minutes of sweaty aerobic exercise per week.  4)Reduce stress - consider  counseling, meditation and relaxation to balance other aspects of your life.

## 2017-07-07 NOTE — Progress Notes (Signed)
HPI:  Using dictation device. Unfortunately this device frequently misinterprets words/phrases.  Rhonda Weaver is a pleasant 84 y.o. here for follow up. Chronic medical problems summarized below were reviewed for changes. Reports doing well. Daughter reports she is doing very well. Eating healthy, getting regular activity. Brings her medications. No specific reason for asa use.Denies CP, SOB, DOE, treatment intolerance or new symptoms.  AWV 04/07/17  HTN:  -meds:asa, norvasc 5, atenolol 50 (from prior PCP), losartan-hctz  -Denies chest pain, shortness of breath, swelling, headaches or trouble breathing   Hyperglycemia:  -improving diet and exercise -No fatigue, polyuria or vision changes   ROS: See pertinent positives and negatives per HPI.  Past Medical History:  Diagnosis Date  . Allergy   . Cataract   . Chronic bronchitis ([place]) 10/15/2006   Qualifier: Diagnosis of  By: Lenna Gilford MD, [place], ADENOMATOUS 10/15/2006   Qualifier: Diagnosis of  By: Lenna Gilford MD, [place] DISEASE 09/28/2007   Qualifier: Diagnosis of  By: Lenna Gilford MD, [place] 09/28/2007   Qualifier: Diagnosis of  By: Lenna Gilford MD, Deborra Medina   . GERD (gastroesophageal reflux disease)   . HEMORRHOIDS, INTERNAL 10/15/2006   Qualifier: Diagnosis of  By: Lenna Gilford MD, Deborra Medina   . Hyperlipidemia   . Hypertension   . Irritable bowel syndrome 10/15/2006   Qualifier: Diagnosis of  By: Lenna Gilford MD, Deborra Medina     Past Surgical History:  Procedure Laterality Date  . COLONOSCOPY    . DENTAL SURGERY    . POLYPECTOMY    . VAGINAL HYSTERECTOMY      Family History  Problem Relation Age of Onset  . Heart disease Mother   . Kidney disease Father     SOCIAL HX: see hpi   Current Outpatient Medications:  .  acetaminophen (TYLENOL) 500 MG tablet, Take 500 mg by mouth every 6 (six) hours as needed., Disp: , Rfl:  .  amLODipine (NORVASC) 5 MG tablet, Take 1 tablet (5 mg total) by  mouth daily., Disp: 90 tablet, Rfl: 3 .  aspirin 81 MG EC tablet, Take 81 mg by mouth daily.  , Disp: , Rfl:  .  atenolol (TENORMIN) 50 MG tablet, TAKE 1 TABLET BY MOUTH ONCE DAILY, Disp: 30 tablet, Rfl: 5 .  atorvastatin (LIPITOR) 20 MG tablet, TAKE 1 TABLET BY MOUTH ONCE DAILY, Disp: 90 tablet, Rfl: 1 .  Bismuth Subsalicylate (PEPTO-BISMOL PO), Take by mouth., Disp: , Rfl:  .  fluticasone (FLONASE) 50 MCG/ACT nasal spray, USE TWO SPRAY(S) IN EACH NOSTRIL ONCE DAILY, Disp: 16 g, Rfl: 3 .  fluticasone (FLOVENT HFA) 44 MCG/ACT inhaler, Inhale 2 puffs into the lungs as needed., Disp: , Rfl:  .  losartan-hydrochlorothiazide (HYZAAR) 100-12.5 MG tablet, Take 1 tablet by mouth daily., Disp: 90 tablet, Rfl: 3 .  Magnesium Hydroxide (MILK OF MAGNESIA PO), Take by mouth., Disp: , Rfl:   EXAM:  Vitals:   07/07/17 1351  BP: 128/70  Pulse: 76  Temp: 98.2 F (36.8 C)    Body mass index is 23.16 kg/m.  GENERAL: vitals reviewed and listed above, alert, oriented, appears well hydrated and in no acute distress  HEENT: atraumatic, conjunttiva clear, no obvious abnormalities on inspection of external nose and ears  NECK: no obvious masses on inspection  LUNGS: clear to auscultation bilaterally, no wheezes, rales or rhonchi, good air movement  CV: HRRR, no peripheral edema  MS: moves  all extremities without noticeable abnormality  PSYCH: pleasant and cooperative, no obvious depression or anxiety  ASSESSMENT AND PLAN:  Discussed the following assessment and plan:  Essential hypertension - Plan: Basic metabolic panel  Hyperglycemia - Plan: Hemoglobin A1c  -check labs, bs a little up last check, doing better with diet -continue current treatment -follow up 3-4 months, sooner as needed  Patient Instructions  BEFORE YOU LEAVE: -labs -follow up: about 3-4 months  We have ordered labs or studies at this visit. It can take up to 1-2 weeks for results and processing. IF results require  follow up or explanation, we will call you with instructions. Clinically stable results will be released to your [facility]. If you have not heard from [place] or cannot find your results in [facility] in 2 weeks please contact our office at [phone].  If you are not yet signed up for [facility], please consider signing up.  Continue your current medications.   We recommend the following healthy lifestyle for LIFE: 1) Small portions. But, make sure to get regular (at least 3 per day), healthy meals and small healthy snacks if needed.  2) Eat a healthy clean diet.   TRY TO EAT: -at least 5-7 servings of low sugar, colorful, and nutrient rich vegetables per day (not corn, potatoes or bananas.) -berries are the best choice if you wish to eat fruit (only eat small amounts if trying to reduce weight)  -lean meets (fish, white meat of chicken or [place]) -vegan proteins for some meals - beans or tofu, whole grains, nuts and seeds -Replace bad fats with good fats - good fats include: fish, nuts and seeds, canola oil, olive oil -small amounts of low fat or non fat dairy -small amounts of100 % whole grains - check the lables -drink plenty of water  AVOID: -SUGAR, sweets, anything with added sugar, corn syrup or sweeteners - must read labels as even foods advertised as "healthy" often are loaded with sugar -if you must have a sweetener, small amounts of stevia may be best -sweetened beverages and artificially sweetened beverages -simple starches (rice, bread, potatoes, pasta, chips, etc - small amounts of 100% whole grains are ok) -red meat, pork, butter -fried foods, fast food, processed food, excessive dairy, eggs and coconut.  3)Get at least 150 minutes of sweaty aerobic exercise per week.  4)Reduce stress - consider counseling, meditation and relaxation to balance other aspects of your life.           Lucretia Kern, DO

## 2017-07-21 ENCOUNTER — Other Ambulatory Visit: Payer: Self-pay | Admitting: Family Medicine

## 2017-08-17 ENCOUNTER — Other Ambulatory Visit: Payer: Self-pay | Admitting: *Deleted

## 2017-08-19 ENCOUNTER — Telehealth: Payer: Self-pay | Admitting: Family Medicine

## 2017-08-19 NOTE — Telephone Encounter (Signed)
Copied from [place] [id number]. Topic: Quick Communication - See Telephone Encounter >> Aug 19, 2017 11:49 AM Synthia Innocent wrote: CRM for notification. See Telephone encounter for: 08/19/17. Pharmacy unable to get losartan-hydrochlorothiazide (HYZAAR) 100-12.5 MG tablet, can they sperate?

## 2017-08-20 MED ORDER — LOSARTAN POTASSIUM 100 MG PO TABS: 100.0000 mg | ORAL_TABLET | Freq: Every day | ORAL | 0 refills | Status: DC

## 2017-08-20 MED ORDER — HYDROCHLOROTHIAZIDE 12.5 MG PO TABS: 12.5000 mg | ORAL_TABLET | Freq: Every day | ORAL | 0 refills | Status: DC

## 2017-08-20 NOTE — Telephone Encounter (Signed)
Rx done. 

## 2017-08-20 NOTE — Telephone Encounter (Signed)
Ok to separate

## 2017-10-03 ENCOUNTER — Other Ambulatory Visit: Payer: Self-pay | Admitting: Family Medicine

## 2017-10-06 ENCOUNTER — Telehealth: Payer: Self-pay | Admitting: Family Medicine

## 2017-10-06 NOTE — Telephone Encounter (Signed)
I called Suanne Marker and she stated she feels the patient is confused as she has called and stated one of her medications are to be replaced.  Suanne Marker wants to know if the pt should be taking Amlodipine, Atenolol, Losartan and HCTZ (which was previously the combo pill but changed to 2 separate meds on 7/10 due to pharmacy being out of this) ?  Message sent to Dr Maudie Mercury.

## 2017-10-06 NOTE — Telephone Encounter (Signed)
Please contact patient and pharmacy and review our medication list with them - it appears accurate. Also schedule patient a follow up visit this Thursday as is due for a follow up - or next week with me if Thursday is not a good day for her.

## 2017-10-06 NOTE — Telephone Encounter (Signed)
I called Suanne Marker and informed her of the message below.  I also called the pt, spoke with her daughter Lovey Newcomer and scheduled an appt for 9/5.

## 2017-10-06 NOTE — Telephone Encounter (Signed)
Copied from [place]. Topic: Quick Communication - See Telephone Encounter >> Oct 06, 2017  2:20 PM Rutherford Nail, [place] wrote: CRM for notification. See Telephone encounter for: 10/06/17. Rhonda with [facility] calling and states that the patient has called multiple times regarding her blood pressure medications. Suanne Marker would like to clarify what medication the patient is supposed to be taking. Please advise. CB#:[phone]

## 2017-10-15 ENCOUNTER — Ambulatory Visit: Payer: PPO | Admitting: Family Medicine

## 2017-10-22 ENCOUNTER — Ambulatory Visit: Payer: PPO | Admitting: Family Medicine

## 2017-10-30 ENCOUNTER — Other Ambulatory Visit: Payer: Self-pay | Admitting: Family Medicine

## 2017-11-06 NOTE — Progress Notes (Deleted)
HPI:  Using dictation device. Unfortunately this device frequently misinterprets words/phrases.  Rhonda Weaver is a pleasant 84 y.o. here for follow up. Chronic medical problems summarized below were reviewed for changes and stability and were updated as needed below. These issues and their treatment remain stable for the most part. ***. Denies CP, SOB, DOE, treatment intolerance or new symptoms. Due for flu shot, dexa, labs, recall AWV 04/07/17  HTN:  -meds:asa, norvasc 5, atenolol 50 (from prior PCP), losartan-hctz  -Denies chest pain, shortness of breath, swelling, headaches or trouble breathing   Hyperglycemia:  -improving diet and exercise -No fatigue, polyuria or vision changes   ROS: See pertinent positives and negatives per HPI.  Past Medical History:  Diagnosis Date  . Allergy   . Cataract   . Chronic bronchitis ([place]) 10/15/2006   Qualifier: Diagnosis of  By: Lenna Gilford MD, [place], ADENOMATOUS 10/15/2006   Qualifier: Diagnosis of  By: Lenna Gilford MD, [place] DISEASE 09/28/2007   Qualifier: Diagnosis of  By: Lenna Gilford MD, [place] 09/28/2007   Qualifier: Diagnosis of  By: Lenna Gilford MD, Deborra Medina   . GERD (gastroesophageal reflux disease)   . HEMORRHOIDS, INTERNAL 10/15/2006   Qualifier: Diagnosis of  By: Lenna Gilford MD, Deborra Medina   . Hyperlipidemia   . Hypertension   . Irritable bowel syndrome 10/15/2006   Qualifier: Diagnosis of  By: Lenna Gilford MD, Deborra Medina     Past Surgical History:  Procedure Laterality Date  . COLONOSCOPY    . DENTAL SURGERY    . POLYPECTOMY    . VAGINAL HYSTERECTOMY      Family History  Problem Relation Age of Onset  . Heart disease Mother   . Kidney disease Father     SOCIAL HX: ***   Current Outpatient Medications:  .  acetaminophen (TYLENOL) 500 MG tablet, Take 500 mg by mouth every 6 (six) hours as needed., Disp: , Rfl:  .  amLODipine (NORVASC) 5 MG tablet, TAKE 1 TABLET BY MOUTH ONCE DAILY,  Disp: 90 tablet, Rfl: 1 .  aspirin 81 MG EC tablet, Take 81 mg by mouth daily.  , Disp: , Rfl:  .  atenolol (TENORMIN) 50 MG tablet, TAKE 1 TABLET BY MOUTH ONCE DAILY, Disp: 30 tablet, Rfl: 5 .  atorvastatin (LIPITOR) 20 MG tablet, TAKE 1 TABLET BY MOUTH ONCE DAILY, Disp: 90 tablet, Rfl: 1 .  atorvastatin (LIPITOR) 20 MG tablet, TAKE 1 TABLET BY MOUTH ONCE DAILY, Disp: 90 tablet, Rfl: 1 .  Bismuth Subsalicylate (PEPTO-BISMOL PO), Take by mouth., Disp: , Rfl:  .  fluticasone (FLONASE) 50 MCG/ACT nasal spray, USE TWO SPRAY(S) IN EACH NOSTRIL ONCE DAILY, Disp: 16 g, Rfl: 3 .  fluticasone (FLOVENT HFA) 44 MCG/ACT inhaler, Inhale 2 puffs into the lungs as needed., Disp: , Rfl:  .  hydrochlorothiazide (HYDRODIURIL) 12.5 MG tablet, Take 1 tablet (12.5 mg total) by mouth daily., Disp: 90 tablet, Rfl: 0 .  losartan (COZAAR) 100 MG tablet, Take 1 tablet (100 mg total) by mouth daily., Disp: 90 tablet, Rfl: 0 .  Magnesium Hydroxide (MILK OF MAGNESIA PO), Take by mouth., Disp: , Rfl:   EXAM:  There were no vitals filed for this visit.  There is no height or weight on file to calculate BMI.  GENERAL: vitals reviewed and listed above, alert, oriented, appears well hydrated and in no acute distress  HEENT: atraumatic, conjunttiva clear, no obvious abnormalities  on inspection of external nose and ears  NECK: no obvious masses on inspection  LUNGS: clear to auscultation bilaterally, no wheezes, rales or rhonchi, good air movement  CV: HRRR, no peripheral edema  MS: moves all extremities without noticeable abnormality *** PSYCH: pleasant and cooperative, no obvious depression or anxiety  ASSESSMENT AND PLAN:  Discussed the following assessment and plan:  No diagnosis found.  *** -Patient advised to return or notify a doctor immediately if symptoms worsen or persist or new concerns arise.  There are no Patient Instructions on file for this visit.  Lucretia Kern, DO

## 2017-11-09 ENCOUNTER — Ambulatory Visit: Payer: PPO | Admitting: Family Medicine

## 2017-11-09 DIAGNOSIS — Z0289 Encounter for other administrative examinations: Secondary | ICD-10-CM

## 2017-11-16 NOTE — Progress Notes (Signed)
HPI:  Using dictation device. Unfortunately this device frequently misinterprets words/phrases.  Rhonda Weaver is a pleasant 84 y.o. here for follow up. Chronic medical problems summarized below were reviewed for changes and stability and were updated as needed below. These issues and their treatment remain stable for the most part. Dong well per pt and daughter without complaints. Pt reports getting flu shot elsewhere, daughter is going to check and confrim. Denies CP, SOB, DOE, treatment intolerance or new symptoms.   AWV 04/07/17  HTN:  -meds:asa, norvasc 5, atenolol 50 (from prior PCP), losartan, hctz  -Denies chest pain, shortness of breath, swelling, headaches or trouble breathing   Hyperglycemia/Hyperlipidemia:  -improving diet and exercise -No fatigue, polyuria or vision changes  Declines any further dexas. Discussed risks benefits, she refuses.  ROS: See pertinent positives and negatives per HPI.  Past Medical History:  Diagnosis Date  . Allergy   . Cataract   . Chronic bronchitis ([place]) 10/15/2006   Qualifier: Diagnosis of  By: Lenna Gilford MD, [place], ADENOMATOUS 10/15/2006   Qualifier: Diagnosis of  By: Lenna Gilford MD, [place] DISEASE 09/28/2007   Qualifier: Diagnosis of  By: Lenna Gilford MD, [place] 09/28/2007   Qualifier: Diagnosis of  By: Lenna Gilford MD, Deborra Medina   . GERD (gastroesophageal reflux disease)   . HEMORRHOIDS, INTERNAL 10/15/2006   Qualifier: Diagnosis of  By: Lenna Gilford MD, Deborra Medina   . Hyperlipidemia   . Hypertension   . Irritable bowel syndrome 10/15/2006   Qualifier: Diagnosis of  By: Lenna Gilford MD, Deborra Medina     Past Surgical History:  Procedure Laterality Date  . COLONOSCOPY    . DENTAL SURGERY    . POLYPECTOMY    . VAGINAL HYSTERECTOMY      Family History  Problem Relation Age of Onset  . Heart disease Mother   . Kidney disease Father     SOCIAL HX: see hpi   Current Outpatient Medications:  .   acetaminophen (TYLENOL) 500 MG tablet, Take 500 mg by mouth every 6 (six) hours as needed., Disp: , Rfl:  .  amLODipine (NORVASC) 5 MG tablet, TAKE 1 TABLET BY MOUTH ONCE DAILY, Disp: 90 tablet, Rfl: 1 .  aspirin 81 MG EC tablet, Take 81 mg by mouth daily.  , Disp: , Rfl:  .  atenolol (TENORMIN) 50 MG tablet, TAKE 1 TABLET BY MOUTH ONCE DAILY, Disp: 30 tablet, Rfl: 5 .  atorvastatin (LIPITOR) 20 MG tablet, TAKE 1 TABLET BY MOUTH ONCE DAILY, Disp: 90 tablet, Rfl: 1 .  Bismuth Subsalicylate (PEPTO-BISMOL PO), Take by mouth., Disp: , Rfl:  .  fluticasone (FLONASE) 50 MCG/ACT nasal spray, USE TWO SPRAY(S) IN EACH NOSTRIL ONCE DAILY, Disp: 16 g, Rfl: 3 .  fluticasone (FLOVENT HFA) 44 MCG/ACT inhaler, Inhale 2 puffs into the lungs as needed., Disp: , Rfl:  .  hydrochlorothiazide (HYDRODIURIL) 12.5 MG tablet, Take 1 tablet (12.5 mg total) by mouth daily., Disp: 90 tablet, Rfl: 0 .  losartan (COZAAR) 100 MG tablet, Take 1 tablet (100 mg total) by mouth daily., Disp: 90 tablet, Rfl: 0 .  Magnesium Hydroxide (MILK OF MAGNESIA PO), Take by mouth., Disp: , Rfl:   EXAM:  Vitals:   11/17/17 1003  BP: 120/68  Pulse: 73  Temp: 98.6 F (37 C)    Body mass index is 21.98 kg/m.  GENERAL: vitals reviewed and listed above, alert, oriented, appears well hydrated  and in no acute distress  HEENT: atraumatic, conjunttiva clear, no obvious abnormalities on inspection of external nose and ears  NECK: no obvious masses on inspection  LUNGS: clear to auscultation bilaterally, no wheezes, rales or rhonchi, good air movement  CV: HRRR, no peripheral edema  MS: moves all extremities without noticeable abnormality  PSYCH: pleasant and cooperative, no obvious depression or anxiety  ASSESSMENT AND PLAN:  Discussed the following assessment and plan:  Essential hypertension - Plan: Basic metabolic panel, CBC  Hyperglycemia  Hyperlipidemia, unspecified hyperlipidemia type  -labs per orders -lifestyle  recs -cont current meds pending labs -she declined any further bone density tests -Patient advised to return or notify a doctor immediately if symptoms worsen or persist or new concerns arise.  Patient Instructions  BEFORE YOU LEAVE: -labs -follow up: 3-4 months  We have ordered labs or studies at this visit. It can take up to 1-2 weeks for results and processing. IF results require follow up or explanation, we will call you with instructions. Clinically stable results will be released to your [facility]. If you have not heard from [place] or cannot find your results in [facility] in 2 weeks please contact our office at [phone].  If you are not yet signed up for [facility], please consider signing up.   We recommend the following healthy lifestyle for LIFE: 1) Small portions. But, make sure to get regular (at least 3 per day), healthy meals and small healthy snacks if needed.  2) Eat a healthy clean diet.   TRY TO EAT: -at least 5-7 servings of low sugar, colorful, and nutrient rich vegetables per day (not corn, potatoes or bananas.) -berries are the best choice if you wish to eat fruit (only eat small amounts if trying to reduce weight)  -lean meets (fish, white meat of chicken or [place]) -vegan proteins for some meals - beans or tofu, whole grains, nuts and seeds -Replace bad fats with good fats - good fats include: fish, nuts and seeds, canola oil, olive oil -small amounts of low fat or non fat dairy -small amounts of100 % whole grains - check the lables -drink plenty of water  AVOID: -SUGAR, sweets, anything with added sugar, corn syrup or sweeteners - must read labels as even foods advertised as "healthy" often are loaded with sugar -if you must have a sweetener, small amounts of stevia may be best -sweetened beverages and artificially sweetened beverages -simple starches (rice, bread, potatoes, pasta, chips, etc - small amounts of 100% whole grains are ok) -red meat, pork,  butter -fried foods, fast food, processed food, excessive dairy, eggs and coconut.  3)Get at least 150 minutes of sweaty aerobic exercise per week.  4)Reduce stress - consider counseling, meditation and relaxation to balance other aspects of your life.          Lucretia Kern, DO

## 2017-11-17 ENCOUNTER — Encounter: Payer: Self-pay | Admitting: Family Medicine

## 2017-11-17 ENCOUNTER — Ambulatory Visit (INDEPENDENT_AMBULATORY_CARE_PROVIDER_SITE_OTHER): Payer: PPO | Admitting: Family Medicine

## 2017-11-17 VITALS — BP 120/68 | HR 73 | Temp 98.6°F | Ht 61.75 in | Wt 119.2 lb

## 2017-11-17 DIAGNOSIS — E785 Hyperlipidemia, unspecified: Secondary | ICD-10-CM | POA: Diagnosis not present

## 2017-11-17 DIAGNOSIS — R739 Hyperglycemia, unspecified: Secondary | ICD-10-CM | POA: Diagnosis not present

## 2017-11-17 DIAGNOSIS — I10 Essential (primary) hypertension: Secondary | ICD-10-CM

## 2017-11-17 LAB — CBC
HEMATOCRIT: 42.6 % (ref 36.0–46.0)
Hemoglobin: 14.2 g/dL (ref 12.0–15.0)
MCHC: 33.5 g/dL (ref 30.0–36.0)
MCV: 97.4 fl (ref 78.0–100.0)
Platelets: 234 10*3/uL (ref 150.0–400.0)
RBC: 4.37 Mil/uL (ref 3.87–5.11)
RDW: 12.6 % (ref 11.5–15.5)
WBC: 6.3 10*3/uL (ref 4.0–10.5)

## 2017-11-17 LAB — BASIC METABOLIC PANEL
BUN: 14 mg/dL (ref 6–23)
CO2: 33 meq/L — AB (ref 19–32)
Calcium: 9.6 mg/dL (ref 8.4–10.5)
Chloride: 97 mEq/L (ref 96–112)
Creatinine, Ser: 0.97 mg/dL (ref 0.40–1.20)
GFR: 58.03 mL/min — ABNORMAL LOW (ref >60.00)
Glucose, Bld: 118 mg/dL — ABNORMAL HIGH (ref 70–99)
Potassium: 4.2 mEq/L (ref 3.5–5.1)
SODIUM: 137 meq/L (ref 135–145)

## 2017-11-17 NOTE — Patient Instructions (Signed)
BEFORE YOU LEAVE: -labs -follow up: 3-4 months  We have ordered labs or studies at this visit. It can take up to 1-2 weeks for results and processing. IF results require follow up or explanation, we will call you with instructions. Clinically stable results will be released to your MYCHART. If you have not heard from us or cannot find your results in MYCHART in 2 weeks please contact our office at [phone].  If you are not yet signed up for MYCHART, please consider signing up.   We recommend the following healthy lifestyle for LIFE: 1) Small portions. But, make sure to get regular (at least 3 per day), healthy meals and small healthy snacks if needed.  2) Eat a healthy clean diet.   TRY TO EAT: -at least 5-7 servings of low sugar, colorful, and nutrient rich vegetables per day (not corn, potatoes or bananas.) -berries are the best choice if you wish to eat fruit (only eat small amounts if trying to reduce weight)  -lean meets (fish, white meat of chicken or turkey) -vegan proteins for some meals - beans or tofu, whole grains, nuts and seeds -Replace bad fats with good fats - good fats include: fish, nuts and seeds, canola oil, olive oil -small amounts of low fat or non fat dairy -small amounts of100 % whole grains - check the lables -drink plenty of water  AVOID: -SUGAR, sweets, anything with added sugar, corn syrup or sweeteners - must read labels as even foods advertised as "healthy" often are loaded with sugar -if you must have a sweetener, small amounts of stevia may be best -sweetened beverages and artificially sweetened beverages -simple starches (rice, bread, potatoes, pasta, chips, etc - small amounts of 100% whole grains are ok) -red meat, pork, butter -fried foods, fast food, processed food, excessive dairy, eggs and coconut.  3)Get at least 150 minutes of sweaty aerobic exercise per week.  4)Reduce stress - consider counseling, meditation and relaxation to balance  other aspects of your life.        

## 2018-03-24 NOTE — Progress Notes (Signed)
HPI:  Using dictation device. Unfortunately this device frequently misinterprets words/phrases.  Rhonda Weaver is a pleasant 85 y.o. here for follow up. Chronic medical problems summarized below were reviewed for changes and stability and were updated as needed below. These issues and their treatment remain stable for the most part.  Reports doing well. No complaints. Reports getting regular exercise and trying to eat healthy.Denies CP, HA, vision changes, SOB, DOE, treatment intolerance or new symptoms. Due for labs, AWV next visit. AWV 04/07/17  HTN:  -meds:asa, norvasc 5, atenolol 50 (from prior PCP), losartan, hctz  -Denies chest pain, shortness of breath, swelling, headaches or trouble breathing   Hyperglycemia/Hyperlipidemia:  -improving diet and exercise -No fatigue, polyuria or vision changes  Declines any further dexas. Discussed risks benefits, she refuses.   ROS: See pertinent positives and negatives per HPI.  Past Medical History:  Diagnosis Date  . Allergy   . Cataract   . Chronic bronchitis ([place]) 10/15/2006   Qualifier: Diagnosis of  By: Lenna Gilford MD, [place], ADENOMATOUS 10/15/2006   Qualifier: Diagnosis of  By: Lenna Gilford MD, [place] DISEASE 09/28/2007   Qualifier: Diagnosis of  By: Lenna Gilford MD, [place] 09/28/2007   Qualifier: Diagnosis of  By: Lenna Gilford MD, Deborra Medina   . GERD (gastroesophageal reflux disease)   . HEMORRHOIDS, INTERNAL 10/15/2006   Qualifier: Diagnosis of  By: Lenna Gilford MD, Deborra Medina   . Hyperlipidemia   . Hypertension   . Irritable bowel syndrome 10/15/2006   Qualifier: Diagnosis of  By: Lenna Gilford MD, Deborra Medina     Past Surgical History:  Procedure Laterality Date  . COLONOSCOPY    . DENTAL SURGERY    . POLYPECTOMY    . VAGINAL HYSTERECTOMY      Family History  Problem Relation Age of Onset  . Heart disease Mother   . Kidney disease Father     SOCIAL HX: see hpi   Current Outpatient  Medications:  .  acetaminophen (TYLENOL) 500 MG tablet, Take 500 mg by mouth every 6 (six) hours as needed., Disp: , Rfl:  .  amLODipine (NORVASC) 5 MG tablet, TAKE 1 TABLET BY MOUTH ONCE DAILY, Disp: 90 tablet, Rfl: 1 .  aspirin 81 MG EC tablet, Take 81 mg by mouth daily.  , Disp: , Rfl:  .  atenolol (TENORMIN) 50 MG tablet, TAKE 1 TABLET BY MOUTH ONCE DAILY, Disp: 30 tablet, Rfl: 5 .  atorvastatin (LIPITOR) 20 MG tablet, TAKE 1 TABLET BY MOUTH ONCE DAILY, Disp: 90 tablet, Rfl: 1 .  Bismuth Subsalicylate (PEPTO-BISMOL PO), Take by mouth., Disp: , Rfl:  .  fluticasone (FLONASE) 50 MCG/ACT nasal spray, USE TWO SPRAY(S) IN EACH NOSTRIL ONCE DAILY, Disp: 16 g, Rfl: 3 .  fluticasone (FLOVENT HFA) 44 MCG/ACT inhaler, Inhale 2 puffs into the lungs as needed., Disp: , Rfl:  .  hydrochlorothiazide (HYDRODIURIL) 12.5 MG tablet, Take 1 tablet (12.5 mg total) by mouth daily., Disp: 90 tablet, Rfl: 0 .  losartan (COZAAR) 100 MG tablet, Take 1 tablet (100 mg total) by mouth daily., Disp: 90 tablet, Rfl: 0 .  Magnesium Hydroxide (MILK OF MAGNESIA PO), Take by mouth., Disp: , Rfl:   EXAM:  Vitals:   03/25/18 0925  BP: 138/68  Pulse: 74  Temp: 98.2 F (36.8 C)    Body mass index is 23.01 kg/m.  GENERAL: vitals reviewed and listed above, alert, oriented, appears well  hydrated and in no acute distress  HEENT: atraumatic, conjunttiva clear, no obvious abnormalities on inspection of external nose and ears  NECK: no obvious masses on inspection  LUNGS: clear to auscultation bilaterally, no wheezes, rales or rhonchi, good air movement  CV: HRRR, no peripheral edema  MS: moves all extremities without noticeable abnormality  PSYCH: pleasant and cooperative, no obvious depression or anxiety  ASSESSMENT AND PLAN:  Discussed the following assessment and plan:  Essential hypertension - Plan: Basic metabolic panel  HYPERLIPIDEMIA, MIXED  Hyperglycemia - Plan: Hemoglobin A1c  -lifestyle  recs -she and daughter feel BP up from stressful ride in the rain to get here - they agree to monitor at home and discussed may increase hctz if running high at home - reviewed med list and her meds. On combo of the hctz-lisinopril. -labs per orders -awv with fasting lipids in 3-4 months  Patient Instructions  BEFORE YOU LEAVE: -labs -follow up: AWV in 3 months with Raquel Sarna and follow up with Dr. Maudie Mercury the same day, early for labs fasting  Keep an eye on the blood pressure at home. Check 3 times per week and keep a log. Please call me if running over 140/80 on average and we will adjust medication.  We have ordered labs or studies at this visit. It can take up to 1-2 weeks for results and processing. IF results require follow up or explanation, we will call you with instructions. Clinically stable results will be released to your [facility]. If you have not heard from [place] or cannot find your results in [facility] in 2 weeks please contact our office at [phone].  If you are not yet signed up for [facility], please consider signing up.         We recommend the following healthy lifestyle for LIFE: 1) Small portions. But, make sure to get regular (at least 3 per day), healthy meals and small healthy snacks if needed.  2) Eat a healthy clean diet.   TRY TO EAT: -at least 5-7 servings of low sugar, colorful, and nutrient rich vegetables per day (not corn, potatoes or bananas.) -berries are the best choice if you wish to eat fruit (only eat small amounts if trying to reduce weight)  -lean meets (fish, white meat of chicken or [place]) -vegan proteins for some meals - beans or tofu, whole grains, nuts and seeds -Replace bad fats with good fats - good fats include: fish, nuts and seeds, canola oil, olive oil -small amounts of low fat or non fat dairy -small amounts of100 % whole grains - check the lables -drink plenty of water  AVOID: -SUGAR, sweets, anything with added sugar, corn syrup or  sweeteners - must read labels as even foods advertised as "healthy" often are loaded with sugar -if you must have a sweetener, small amounts of stevia may be best -sweetened beverages and artificially sweetened beverages -simple starches (rice, bread, potatoes, pasta, chips, etc - small amounts of 100% whole grains are ok) -red meat, pork, butter -fried foods, fast food, processed food, excessive dairy, eggs and coconut.  3)Get at least 150 minutes of sweaty aerobic exercise per week.  4)Reduce stress - consider counseling, meditation and relaxation to balance other aspects of your life.     Lucretia Kern, DO

## 2018-03-25 ENCOUNTER — Ambulatory Visit (INDEPENDENT_AMBULATORY_CARE_PROVIDER_SITE_OTHER): Payer: PPO | Admitting: Family Medicine

## 2018-03-25 ENCOUNTER — Encounter: Payer: Self-pay | Admitting: Family Medicine

## 2018-03-25 VITALS — BP 138/68 | HR 74 | Temp 98.2°F | Ht 61.75 in | Wt 124.8 lb

## 2018-03-25 DIAGNOSIS — E782 Mixed hyperlipidemia: Secondary | ICD-10-CM

## 2018-03-25 DIAGNOSIS — I10 Essential (primary) hypertension: Secondary | ICD-10-CM | POA: Diagnosis not present

## 2018-03-25 DIAGNOSIS — R739 Hyperglycemia, unspecified: Secondary | ICD-10-CM | POA: Diagnosis not present

## 2018-03-25 LAB — BASIC METABOLIC PANEL
BUN: 19 mg/dL (ref 6–23)
CO2: 35 meq/L — AB (ref 19–32)
CREATININE: 0.98 mg/dL (ref 0.40–1.20)
Calcium: 9.7 mg/dL (ref 8.4–10.5)
Chloride: 97 mEq/L (ref 96–112)
GFR: 53.91 mL/min — ABNORMAL LOW (ref >60.00)
Glucose, Bld: 105 mg/dL — ABNORMAL HIGH (ref 70–99)
Potassium: 4.5 mEq/L (ref 3.5–5.1)
Sodium: 139 mEq/L (ref 135–145)

## 2018-03-25 LAB — HEMOGLOBIN A1C: Hgb A1c MFr Bld: 6.3 % (ref 4.6–6.5)

## 2018-03-25 NOTE — Patient Instructions (Signed)
BEFORE YOU LEAVE: -labs -follow up: AWV in 3 months with Raquel Sarna and follow up with Dr. Maudie Mercury the same day, early for labs fasting  Keep an eye on the blood pressure at home. Check 3 times per week and keep a log. Please call me if running over 140/80 on average and we will adjust medication.  We have ordered labs or studies at this visit. It can take up to 1-2 weeks for results and processing. IF results require follow up or explanation, we will call you with instructions. Clinically stable results will be released to your [facility]. If you have not heard from [place] or cannot find your results in [facility] in 2 weeks please contact our office at [phone].  If you are not yet signed up for [facility], please consider signing up.         We recommend the following healthy lifestyle for LIFE: 1) Small portions. But, make sure to get regular (at least 3 per day), healthy meals and small healthy snacks if needed.  2) Eat a healthy clean diet.   TRY TO EAT: -at least 5-7 servings of low sugar, colorful, and nutrient rich vegetables per day (not corn, potatoes or bananas.) -berries are the best choice if you wish to eat fruit (only eat small amounts if trying to reduce weight)  -lean meets (fish, white meat of chicken or [place]) -vegan proteins for some meals - beans or tofu, whole grains, nuts and seeds -Replace bad fats with good fats - good fats include: fish, nuts and seeds, canola oil, olive oil -small amounts of low fat or non fat dairy -small amounts of100 % whole grains - check the lables -drink plenty of water  AVOID: -SUGAR, sweets, anything with added sugar, corn syrup or sweeteners - must read labels as even foods advertised as "healthy" often are loaded with sugar -if you must have a sweetener, small amounts of stevia may be best -sweetened beverages and artificially sweetened beverages -simple starches (rice, bread, potatoes, pasta, chips, etc - small amounts of 100% whole grains are  ok) -red meat, pork, butter -fried foods, fast food, processed food, excessive dairy, eggs and coconut.  3)Get at least 150 minutes of sweaty aerobic exercise per week.  4)Reduce stress - consider counseling, meditation and relaxation to balance other aspects of your life.

## 2018-03-30 ENCOUNTER — Telehealth: Payer: Self-pay | Admitting: Family Medicine

## 2018-03-30 NOTE — Telephone Encounter (Signed)
Phone call to pt's daughter, [name].  Unable to find a DPR, giving permission to discuss pt's.medical information with her.  Daughter stated she is not with her mother, and will call back tomorrow, when they are together to receive results. Daughter was very understanding of the requirement.

## 2018-03-30 NOTE — Telephone Encounter (Signed)
Patient's daughter returning call for lab results. NOD currently unavailable. Please advise.   Copied from [place] [id number]. Topic: Quick Communication - Lab Results (Clinic Use ONLY) >> Mar 30, 2018  2:45 PM Agnes Lawrence, CMA wrote: Called patient to inform them of lab results. When patient returns call, triage nurse may disclose results.

## 2018-04-08 ENCOUNTER — Ambulatory Visit: Payer: PPO

## 2018-04-15 ENCOUNTER — Telehealth: Payer: Self-pay | Admitting: Family Medicine

## 2018-04-15 NOTE — Telephone Encounter (Signed)
I called the pt and informed her daughter Lovey Newcomer of the message below.  She read the patients  bottle which is the same as the RX requested, not 2 separate pills.  She is aware the refill was sent to the pharmacy and the medication list was changed also.

## 2018-04-15 NOTE — Telephone Encounter (Signed)
Rhonda Weaver, Please find out if taking the combo or the medications split. Then refill and update med list. Ok to refill 90 days with 1 refill. Thanks.

## 2018-04-23 NOTE — Telephone Encounter (Signed)
Pt daughter called and stated that RX was not available.Called Pharmacy and Pharmacy is currently on back order. FYI. Pharmacy states that they had the split on file for refill. Please advise

## 2018-04-23 NOTE — Telephone Encounter (Signed)
Ok to send separate pills (losartan and hctz).

## 2018-04-23 NOTE — Telephone Encounter (Signed)
I called the pt and informed her of the message below.  Patient stated her daughter bought her 2 bottles of the medication (read the names of each and they are correct) yesterday and a refill is not needed.

## 2018-04-28 ENCOUNTER — Other Ambulatory Visit: Payer: Self-pay | Admitting: Family Medicine

## 2018-06-04 ENCOUNTER — Other Ambulatory Visit: Payer: Self-pay | Admitting: Family Medicine

## 2018-06-07 ENCOUNTER — Other Ambulatory Visit: Payer: Self-pay | Admitting: Family Medicine

## 2018-07-12 ENCOUNTER — Telehealth: Payer: Self-pay

## 2018-07-12 NOTE — Telephone Encounter (Signed)
ATC patient to reschedule her AWV. CRM created.  Health Coach will be back in July.

## 2018-07-27 ENCOUNTER — Ambulatory Visit: Payer: PPO

## 2018-07-27 ENCOUNTER — Ambulatory Visit: Payer: PPO | Admitting: Family Medicine

## 2018-07-27 ENCOUNTER — Other Ambulatory Visit: Payer: Self-pay

## 2018-09-13 ENCOUNTER — Other Ambulatory Visit: Payer: Self-pay

## 2018-09-18 ENCOUNTER — Other Ambulatory Visit: Payer: Self-pay | Admitting: Family Medicine

## 2018-09-20 ENCOUNTER — Other Ambulatory Visit: Payer: Self-pay | Admitting: Family Medicine

## 2018-09-21 ENCOUNTER — Other Ambulatory Visit: Payer: Self-pay | Admitting: Family Medicine

## 2018-09-22 MED ORDER — ATENOLOL 50 MG PO TABS: 50.0000 mg | ORAL_TABLET | Freq: Every day | ORAL | 0 refills | Status: DC

## 2018-09-22 NOTE — Addendum Note (Signed)
Addended by: Agnes Lawrence on: 09/22/2018 08:45 AM   Modules accepted: Orders

## 2018-09-22 NOTE — Telephone Encounter (Signed)
Rx done. 

## 2018-10-08 ENCOUNTER — Ambulatory Visit: Payer: PPO | Admitting: Family Medicine

## 2018-11-11 ENCOUNTER — Other Ambulatory Visit: Payer: Self-pay | Admitting: Family Medicine

## 2018-11-14 ENCOUNTER — Other Ambulatory Visit: Payer: Self-pay | Admitting: Family Medicine

## 2018-11-15 MED ORDER — AMLODIPINE BESYLATE 5 MG PO TABS: 5.0000 mg | ORAL_TABLET | Freq: Every day | ORAL | 0 refills | Status: DC

## 2018-11-15 NOTE — Addendum Note (Signed)
Addended by: Agnes Lawrence on: 11/15/2018 03:50 PM   Modules accepted: Orders

## 2018-11-16 ENCOUNTER — Other Ambulatory Visit: Payer: Self-pay | Admitting: Family Medicine

## 2018-11-17 ENCOUNTER — Other Ambulatory Visit: Payer: Self-pay | Admitting: Family Medicine

## 2018-11-18 ENCOUNTER — Other Ambulatory Visit: Payer: Self-pay | Admitting: Family Medicine

## 2018-12-25 ENCOUNTER — Other Ambulatory Visit: Payer: Self-pay | Admitting: Family Medicine

## 2019-01-11 ENCOUNTER — Other Ambulatory Visit: Payer: Self-pay

## 2019-01-12 ENCOUNTER — Encounter: Payer: Self-pay | Admitting: Family Medicine

## 2019-01-12 ENCOUNTER — Ambulatory Visit (INDEPENDENT_AMBULATORY_CARE_PROVIDER_SITE_OTHER): Payer: PPO | Admitting: Family Medicine

## 2019-01-12 VITALS — BP 168/60 | HR 74 | Temp 97.9°F | Wt 124.9 lb

## 2019-01-12 DIAGNOSIS — R739 Hyperglycemia, unspecified: Secondary | ICD-10-CM | POA: Diagnosis not present

## 2019-01-12 DIAGNOSIS — E782 Mixed hyperlipidemia: Secondary | ICD-10-CM

## 2019-01-12 DIAGNOSIS — J309 Allergic rhinitis, unspecified: Secondary | ICD-10-CM

## 2019-01-12 DIAGNOSIS — I10 Essential (primary) hypertension: Secondary | ICD-10-CM

## 2019-01-12 LAB — CBC WITH DIFFERENTIAL/PLATELET
Basophils Absolute: 0.1 10*3/uL (ref 0.0–0.1)
Basophils Relative: 0.8 % (ref 0.0–3.0)
Eosinophils Absolute: 0.1 10*3/uL (ref 0.0–0.7)
Eosinophils Relative: 0.7 % (ref 0.0–5.0)
HCT: 40.1 % (ref 36.0–46.0)
Hemoglobin: 13.6 g/dL (ref 12.0–15.0)
Lymphocytes Relative: 31.4 % (ref 12.0–46.0)
Lymphs Abs: 2.2 10*3/uL (ref 0.7–4.0)
MCHC: 33.8 g/dL (ref 30.0–36.0)
MCV: 96.9 fl (ref 78.0–100.0)
Monocytes Absolute: 0.8 10*3/uL (ref 0.1–1.0)
Monocytes Relative: 11 % (ref 3.0–12.0)
Neutro Abs: 4 10*3/uL (ref 1.4–7.7)
Neutrophils Relative %: 56.1 % (ref 43.0–77.0)
Platelets: 235 10*3/uL (ref 150.0–400.0)
RBC: 4.14 Mil/uL (ref 3.87–5.11)
RDW: 13.2 % (ref 11.5–15.5)
WBC: 7.1 10*3/uL (ref 4.0–10.5)

## 2019-01-12 LAB — COMPREHENSIVE METABOLIC PANEL
ALT: 26 U/L (ref 0–35)
AST: 25 U/L (ref 0–37)
Albumin: 4 g/dL (ref 3.5–5.2)
Alkaline Phosphatase: 46 U/L (ref 39–117)
BUN: 10 mg/dL (ref 6–23)
CO2: 30 mEq/L (ref 19–32)
Calcium: 9.5 mg/dL (ref 8.4–10.5)
Chloride: 102 mEq/L (ref 96–112)
Creatinine, Ser: 0.89 mg/dL (ref 0.40–1.20)
GFR: 60.13 mL/min (ref >60.00)
Glucose, Bld: 108 mg/dL — ABNORMAL HIGH (ref 70–99)
Potassium: 3.8 mEq/L (ref 3.5–5.1)
Sodium: 140 mEq/L (ref 135–145)
Total Bilirubin: 0.7 mg/dL (ref 0.2–1.2)
Total Protein: 6.7 g/dL (ref 6.0–8.3)

## 2019-01-12 LAB — LDL CHOLESTEROL, DIRECT: Direct LDL: 120 mg/dL

## 2019-01-12 LAB — TSH: TSH: 1.13 u[IU]/mL (ref 0.35–4.50)

## 2019-01-12 LAB — LIPID PANEL
Cholesterol: 214 mg/dL — ABNORMAL HIGH (ref 0–200)
HDL: 37.9 mg/dL — ABNORMAL LOW (ref >39.00)
NonHDL: 176.28
Total CHOL/HDL Ratio: 6
Triglycerides: 250 mg/dL — ABNORMAL HIGH (ref 0.0–149.0)
VLDL: 50 mg/dL — ABNORMAL HIGH (ref 0.0–40.0)

## 2019-01-12 LAB — HEMOGLOBIN A1C: Hgb A1c MFr Bld: 6.1 % (ref 4.6–6.5)

## 2019-01-12 MED ORDER — ATENOLOL 50 MG PO TABS: 50.0000 mg | ORAL_TABLET | Freq: Every day | ORAL | 1 refills | Status: DC

## 2019-01-12 MED ORDER — ATORVASTATIN CALCIUM 20 MG PO TABS: 20.0000 mg | ORAL_TABLET | Freq: Every day | ORAL | 1 refills | Status: DC

## 2019-01-12 MED ORDER — FLUTICASONE PROPIONATE HFA 44 MCG/ACT IN AERO: 2.0000 | INHALATION_SPRAY | RESPIRATORY_TRACT | 2 refills | Status: AC | PRN

## 2019-01-12 MED ORDER — LOSARTAN POTASSIUM-HCTZ 100-12.5 MG PO TABS: 1.0000 | ORAL_TABLET | Freq: Every day | ORAL | 1 refills | Status: DC

## 2019-01-12 MED ORDER — AMLODIPINE BESYLATE 5 MG PO TABS: 5.0000 mg | ORAL_TABLET | Freq: Every day | ORAL | 0 refills | Status: DC

## 2019-01-12 NOTE — Progress Notes (Signed)
Rhonda Weaver DOB: 1931/01/15 Encounter date: 01/12/2019  This is a 85 y.o. female who presents to establish care. Chief Complaint  Patient presents with  . Follow-up    History of present illness: (last bloodwork in 03/2018 but no lipids included)  HTN: norvasc 73m, atenolol 553m losartan- hctz 100-12.5. Does check at home but not lately. Was getting 150/50 and sometimes 160/70. No issues with low pressures.   Hyperglycemia: not much of sweets eater. Cereal, eggs, bacon. Does eat sandwich most days. Mac and cheese.   GERD: sometimes will have acid reflux. Will occasionally take rolaid. 3-4 times/week. Takes pepto a lot as well. If doesn't eat well (like onions, fried foods) then can have loser stools.   Recurrent bronchitis: flovent just used when sick. Hasn't had it filled in a long time.   HL: lipitor 2086maily  Seasonal allergies: flonase  flu shot: done in September.  Has had both pneumonia vaccinations.  Tdap due 06/2023.  Has refused additional DEXA for Dr. KimMaudie Mercury Does drive still; just [place]. [place] [employer] [place]. Daughter does other shopping for her.   Past Medical History:  Diagnosis Date  . Allergy   . Cataract   . Chronic bronchitis ([place]/05/2006   Qualifier: Diagnosis of  By: NadLenna Gilford, ScoGrenadaDENOMATOUS 10/15/2006   Qualifier: Diagnosis of  By: NadLenna Gilford, Sco[place]ortlandSEASE 09/28/2007   Qualifier: Diagnosis of  By: NadLenna Gilford, [place]18/2009   Qualifier: Diagnosis of  By: NadLenna Gilford, ScoDeborra Medina. GERD (gastroesophageal reflux disease)   . HEMORRHOIDS, INTERNAL 10/15/2006   Qualifier: Diagnosis of  By: NadLenna Gilford, ScoDeborra Medina. Hyperlipidemia   . Hypertension   . Irritable bowel syndrome 10/15/2006   Qualifier: Diagnosis of  By: NadLenna Gilford, ScoDeborra Medina Past Surgical History:  Procedure Laterality Date  . COLONOSCOPY    . DENTAL SURGERY    . POLYPECTOMY    . VAGINAL  HYSTERECTOMY     Allergies  Allergen Reactions  . Fenofibrate     REACTION: pt states intolerant  . Gemfibrozil     REACTION: pt states intolerant  . Niacin     REACTION: pt states intolerant  . Penicillins     REACTION: Hx of arm swelling \T\ itching after shot  . Sulfamethoxazole-Trimethoprim     REACTION: "deathly sick"  w/  N\T\V  . Sulfonamide Derivatives     REACTION: nausea and vomiting   Current Meds  Medication Sig  . acetaminophen (TYLENOL) 500 MG tablet Take 500 mg by mouth every 6 (six) hours as needed.  . aMarland KitchenLODipine (NORVASC) 5 MG tablet Take 1 tablet (5 mg total) by mouth daily.  . aMarland Kitchenpirin 81 MG EC tablet Take 81 mg by mouth daily.    . aMarland Kitchenenolol (TENORMIN) 50 MG tablet Take 1 tablet (50 mg total) by mouth daily.  . aMarland Kitchenorvastatin (LIPITOR) 20 MG tablet Take 1 tablet (20 mg total) by mouth daily.  . fluticasone (FLONASE) 50 MCG/ACT nasal spray USE TWO SPRAY(S) IN EACH NOSTRIL ONCE DAILY  . losartan-hydrochlorothiazide (HYZAAR) 100-12.5 MG tablet Take 1 tablet by mouth daily.  . [DISCONTINUED] amLODipine (NORVASC) 5 MG tablet Take 1 tablet (5 mg total) by mouth daily.  . [DISCONTINUED] atenolol (TENORMIN) 50 MG tablet Take 1 tablet (50 mg total) by mouth daily.  . [  DISCONTINUED] atorvastatin (LIPITOR) 20 MG tablet Take 1 tablet by mouth once daily  . [DISCONTINUED] losartan-hydrochlorothiazide (HYZAAR) 100-12.5 MG tablet Take 1 tablet by mouth once daily   Social History   Tobacco Use  . Smoking status: Never Smoker  . Smokeless tobacco: Never Used  Substance Use Topics  . Alcohol use: No   Family History  Problem Relation Age of Onset  . Heart disease Mother   . Kidney disease Father      Review of Systems  Constitutional: Negative for chills, fatigue and fever.  Respiratory: Negative for cough, chest tightness, shortness of breath and wheezing.   Cardiovascular: Negative for chest pain, palpitations and leg swelling.    Objective:  BP (!) 168/72 (BP  Location: Left Arm)   Pulse 74   Temp 97.9 F (36.6 C) (Temporal)   Wt 124 lb 14.4 oz (56.7 kg)   SpO2 96%   BMI 23.03 kg/m   Weight: 124 lb 14.4 oz (56.7 kg)   BP Readings from Last 3 Encounters:  01/12/19 (!) 168/72  03/25/18 138/68  11/17/17 120/68   Wt Readings from Last 3 Encounters:  01/12/19 124 lb 14.4 oz (56.7 kg)  03/25/18 124 lb 12.8 oz (56.6 kg)  11/17/17 119 lb 3.2 oz (54.1 kg)    Physical Exam Constitutional:      General: She is not in acute distress.    Appearance: She is well-developed.  Cardiovascular:     Rate and Rhythm: Normal rate and regular rhythm.     Heart sounds: Murmur present. Crescendo  decrescendo  systolic murmur present with a grade of 2/6. No friction rub.  Pulmonary:     Effort: Pulmonary effort is normal. No respiratory distress.     Breath sounds: Normal breath sounds. No wheezing or rales.  Musculoskeletal:     Right lower leg: No edema.     Left lower leg: No edema.  Neurological:     Mental Status: She is alert and oriented to person, place, and time.  Psychiatric:        Behavior: Behavior normal.     Assessment/Plan:  1. Essential hypertension Elevated in office today with low diastolic. Will have them check at home and report back to me when we call with lab results. - CBC with Differential/Platelet; Future - Comprehensive metabolic panel; Future - amLODipine (NORVASC) 5 MG tablet; Take 1 tablet (5 mg total) by mouth daily.  Dispense: 30 tablet; Refill: 0 - atenolol (TENORMIN) 50 MG tablet; Take 1 tablet (50 mg total) by mouth daily.  Dispense: 90 tablet; Refill: 1 - losartan-hydrochlorothiazide (HYZAAR) 100-12.5 MG tablet; Take 1 tablet by mouth daily.  Dispense: 90 tablet; Refill: 1  2. Allergic rhinitis, unspecified seasonality, unspecified trigger Controlled. - fluticasone (FLOVENT HFA) 44 MCG/ACT inhaler; Inhale 2 puffs into the lungs as needed.  Dispense: 1 Inhaler; Refill: 2  3. HYPERLIPIDEMIA, MIXED -  Comprehensive metabolic panel; Future - Lipid panel; Future - TSH; Future - atorvastatin (LIPITOR) 20 MG tablet; Take 1 tablet (20 mg total) by mouth daily.  Dispense: 90 tablet; Refill: 1  4. Hyperglycemia - Hemoglobin A1c; Future  Return for pending blood pressure report.  Micheline Rough, MD  Will consider bone density (will wait until COVID situation improved)

## 2019-03-28 ENCOUNTER — Other Ambulatory Visit: Payer: Self-pay | Admitting: Family Medicine

## 2019-03-28 DIAGNOSIS — I10 Essential (primary) hypertension: Secondary | ICD-10-CM

## 2019-04-13 ENCOUNTER — Telehealth: Payer: Self-pay | Admitting: Family Medicine

## 2019-04-13 NOTE — Chronic Care Management (AMB) (Signed)
Chronic Care Management   Note  04/13/2019 Name: Rhonda Weaver MRN: [id number] DOB: 1930-10-17  [name] is a 86 y.o. year old female who is a primary care patient of Koberlein, Steele Berg, MD. I reached out to [employer] by phone today in response to a referral sent by [name]'s PCP, Caren Macadam, MD.   [name] was given information about Chronic Care Management services today including:  1. CCM service includes personalized support from designated clinical staff supervised by her physician, including individualized plan of care and coordination with other care providers 2. 24/7 contact phone numbers for assistance for urgent and routine care needs. 3. Service will only be billed when office clinical staff spend 20 minutes or more in a month to coordinate care. 4. Only one practitioner may furnish and bill the service in a calendar month. 5. The patient may stop CCM services at any time (effective at the end of the month) by phone call to the office staff. 6. The patient will be responsible for cost sharing (co-pay) of up to 20% of the service fee (after annual deductible is met).  Patient agreed to services and verbal consent obtained.   Follow up plan:   [name] [employer] Scheduler

## 2019-04-26 ENCOUNTER — Other Ambulatory Visit: Payer: Self-pay | Admitting: Family Medicine

## 2019-04-26 ENCOUNTER — Telehealth: Payer: Self-pay

## 2019-04-26 DIAGNOSIS — E782 Mixed hyperlipidemia: Secondary | ICD-10-CM

## 2019-04-26 DIAGNOSIS — M1991 Primary osteoarthritis, unspecified site: Secondary | ICD-10-CM

## 2019-04-26 DIAGNOSIS — I10 Essential (primary) hypertension: Secondary | ICD-10-CM

## 2019-04-26 NOTE — Telephone Encounter (Signed)
I am requesting an ambulatory referral to CCM be placed for this patient. Referral will need to have 2 current diagnosis attached to it. Thank you!  

## 2019-04-26 NOTE — Telephone Encounter (Signed)
Referral has been placed. 

## 2019-04-28 ENCOUNTER — Other Ambulatory Visit: Payer: Self-pay

## 2019-04-28 ENCOUNTER — Telehealth: Payer: PPO

## 2019-04-28 ENCOUNTER — Ambulatory Visit: Payer: PPO

## 2019-04-28 DIAGNOSIS — I10 Essential (primary) hypertension: Secondary | ICD-10-CM

## 2019-04-28 DIAGNOSIS — J309 Allergic rhinitis, unspecified: Secondary | ICD-10-CM

## 2019-04-28 DIAGNOSIS — E785 Hyperlipidemia, unspecified: Secondary | ICD-10-CM

## 2019-04-28 DIAGNOSIS — M1991 Primary osteoarthritis, unspecified site: Secondary | ICD-10-CM

## 2019-04-28 NOTE — Chronic Care Management (AMB) (Signed)
Chronic Care Management Pharmacy  Name: Rhonda Weaver   MRN: [id number] DOB: July 12, 1930  Initial Questions: 1. Have you seen any other providers since your last visit? NA 2. Any changes in your medicines or health? No   Chief Complaint/ HPI  [name],  86 y.o. , female presents for their Initial CCM visit with the clinical pharmacist via telephone due to COVID-19 Pandemic.  PCP : Caren Macadam, MD  Their chronic conditions include: HTN, HLD, GERD, Congestion, Osteoarthritis, Constipation  Office Visits: 01/12/2019- Patient presented for office visit with  Dr. Micheline Rough, MD for follow up of HTN, allergic rhinitis, HLD, hyperglycemia. Patient advised to check BP readings and report back when they receive call for lab results. Labs ordered CBC, CMP, lipid panel, TSH, A1c.   Medications: Outpatient Encounter Medications as of 04/28/2019  Medication Sig  . acetaminophen (TYLENOL) 500 MG tablet Take 500 mg by mouth every 6 (six) hours as needed.  Marland Kitchen amLODipine (NORVASC) 5 MG tablet Take 1 tablet by mouth once daily  . aspirin 81 MG EC tablet Take 81 mg by mouth daily.    Marland Kitchen atenolol (TENORMIN) 50 MG tablet Take 1 tablet (50 mg total) by mouth daily.  Marland Kitchen atorvastatin (LIPITOR) 20 MG tablet Take 1 tablet (20 mg total) by mouth daily.  . Bismuth Subsalicylate (PEPTO-BISMOL PO) Take by mouth.  . fluticasone (FLONASE) 50 MCG/ACT nasal spray USE TWO SPRAY(S) IN EACH NOSTRIL ONCE DAILY  . fluticasone (FLOVENT HFA) 44 MCG/ACT inhaler Inhale 2 puffs into the lungs as needed.  Marland Kitchen losartan-hydrochlorothiazide (HYZAAR) 100-12.5 MG tablet Take 1 tablet by mouth daily.  . Magnesium Hydroxide (MILK OF MAGNESIA PO) Take by mouth.   No facility-administered encounter medications on file as of 04/28/2019.     Current Diagnosis/Assessment:  Goals Addressed            This Visit's Progress   . Pharmacy Care Plan       CARE PLAN ENTRY  Current Barriers:  . Chronic Disease  Management support, education, and care coordination needs related to HTN, HLD, and Congestion, Osteoarthritis, Constipation  Pharmacist Clinical Goal(s):  Marland Kitchen Work with the care management team to address educational, disease management, and care coordination needs  . Call provider office for new or worsened signs and symptoms  . Over the next month, continue self health monitoring activities as directed today  . Call care management team with questions or concerns.  . Blood pressure:  Marland Kitchen Maintain blood pressure within goal of your provider (130/80 or 140/90)  . High cholesterol:  . Cholesterol goals: Total Cholesterol goal under 200, Triglycerides goal under 150, HDL goal above 40 (men) or above 50 (women), LDL goal under 100.  . Current cholesterol levels (01/12/2019): total cholesterol: 214; triglycerides: 250; HDL: 37; LDL: 120.  Marland Kitchen Acid reflux . Minimize reflux symptoms.  . Congestion . Reduce congestion symptoms.  . Osteoarthritis  . Continue to see improvement in pain level.  . Constipation . Minimize constipation and improve bowel movements.   Interventions: . Comprehensive medication review performed. . Evaluation of current treatment plans and patient's adherence to plan as established by provider . Assessed patient's education and care coordination needs . Provided disease specific education to patient . Blood pressure:  . Discussed need to continue checking blood pressure at home.  . Discussed diet modifications. DASH diet:  following a diet emphasizing fruits and vegetables and low-fat dairy products along with whole grains, fish, poultry, and nuts. Reducing red  meats and sugars.  . Smoking cessation  . Exercising- see exercising section below  . Limiting the amount of alcohol to 1 or 2 drinks per day.   . Reducing the amount of salt intake to 1500mg /per day.  . Recommend using a salt substitute to replace your salt if you need flavor.    . Getting enough potassium in  your diet equaling 3500-5000mg /day.  This helps to regulate BP by balancing out the effects of salt.   . Weight reduction- We discussed losing 5-10% of body weight . Continue:  - amlodipine 5mg , 1 tablet daily  - atenolol 50mg , 1 tablet daily  - losartan/ HCTZ 100/12.5mg , 1 tablet daily  . High cholesterol . How to reduce cholesterol through diet/weight management and physical activity.    . We discussed how a diet high in plant sterols (fruits/vegetables/nuts/whole grains/legumes) may reduce your cholesterol.  Encouraged increasing fiber to a daily intake of 10-25g/day  . Continue: atorvastatin 20mg , 1 tablet daily  . Discussed non-pharmacological interventions for acid reflux. Take measures to prevent acid reflux, such as avoiding spicy foods, avoiding caffeine, avoid laying down a few hours after eating, and raising the head of the bed. . Continue: atorvastatin 20mg , 1 tablet daily  . GERD/ acid reflux . Discussed non-pharmacological interventions for acid reflux. Take measures to prevent acid reflux, such as avoiding spicy foods, avoiding caffeine, avoid laying down a few hours after eating, and raising the head of the bed. . Consider use of Tums or Pepcid as needed . Congestion . Continue:  - Flovent HFA inhaler, 2 puffs as needed - fluticasone nasal spray- as needed . Osteoarthritis . Continue: Tylenol 500mg  every six hours as needed  . Constipation . Continue: milk of magnesia as needed . Increase water intake and include "roughage" into diet.   Patient Self Care Activities:  . Self administers medications as prescribed and Calls provider office for new concerns or questions . Continue current medications as directed by providers.  . Continue at home blood pressure readings. . Continue working on health habits (diet).   Initial goal documentation        Hypertension   Office blood pressures are  BP Readings from Last 3 Encounters:  01/12/19 (!) 168/60  03/25/18 138/68   11/17/17 120/68   Patient has failed these meds in the past: valsartan   Patient checks BP at home checked daily for 2 weeks   Patient home BP readings are ranging: 140/65-70 mmHg Daughter returned call for reported BP readings.  BP, pulse  129/54, 66 119/59; 71 147/66; 79 142/68. 76 130/66, 74 127/64, 64 125/53, 70 146/66, 70 133/62, 69 123/66, 81 134/66, 74 146/84, 67   Patient is controlled on:  - amlodipine 5mg , 1 tablet daily  - atenolol 50mg , 1 tablet daily  - losartan/ HCTZ 100/12.5mg , 1 tablet daily   We discussed diet and exercise extensively.  . Discussed diet modifications. DASH diet:  following a diet emphasizing fruits and vegetables and low-fat dairy products along with whole grains, fish, poultry, and nuts. Reducing red meats and sugars.    . Reducing the amount of salt intake to 1500mg /per day.  . Recommend using a salt substitute to replace your salt if you need flavor.    . Weight reduction- We discussed losing 5-10% of body weight (daughter reports patient does not use salt, but does eat chicken noodle soups and microwavable foods; discussed checking labels).   Plan Denies dizziness/lightheadedness/HA/ orthostatic hypotension.  Continue current medications  Hyperlipidemia   Lipid Panel     Component Value Date/Time   CHOL 214 (H) 01/12/2019 1113   TRIG 250.0 (H) 01/12/2019 1113   HDL 37.90 (L) 01/12/2019 1113   CHOLHDL 6 01/12/2019 1113   VLDL 50.0 (H) 01/12/2019 1113   LDLCALC 110 (H) 03/09/2017 1006   LDLDIRECT 120.0 01/12/2019 1113    The ASCVD Risk score (Goff DC Jr., et al., 2013) failed to calculate for the following reasons:   The 2013 ASCVD risk score is only valid for ages 73 to 41   Patient has failed these meds in past: fenofibrate, gemfibrozil, niacin  Patient is currently uncontrolled on the following medications:  - atorvastatin 20mg , 1 tablet daily   We discussed:  diet and exercise extensively . How to reduce  cholesterol through diet/weight management and physical activity.    . We discussed how a diet high in plant sterols (fruits/vegetables/nuts/whole grains/legumes) may reduce your cholesterol.  Encouraged increasing fiber to a daily intake of 10-25g/day   Plan Continue current medications and diet modifications.   GERD   Patient has failed these meds in past: dramamine  Patient is currently controlled on the following medications:  - Pepto- Bismol: uses for upset stomach   We discussed:  recommended avoiding use of Pepto-Bismol (interaction with aspirin use- both are salicylates and may increase incidence of bleeding.)    Plan Recommended use of Tums or Pepcid as needed.   Congestion  Patient has failed these meds in past: none  Patient is currently controlled on the following medications:  - Flovent HFA inhaler, 2 puffs as needed (patient uses as needed for severe bad congestion- last time.she used it was about 1 month ago).  - fluticasone nasal spray- uses as needing   We discussed:  recommended rinsing mouth after each use of inhaler.   Plan Continue current medications.   Osteoarthritis   Patient has failed these meds in past: none  Patient is currently controlled on the following medications:  - Tylenol 500mg  every six hours as needed   Plan Continue current medications.   Constipation   Patient has failed these meds in past: none  Patient is currently controlled on the following medications:  - milk of magnesia- (patient uses every 2 to 3 weeks as needed.)   We discussed:  ensuring adequate water intake and including "roughage" into diet   Plan Continue current medications.   Medication Management  Patient organizes medications: uses pill box  Barriers: denies issues obtaining medications.  Adherence:  No gaps in refills.   Follow up Follow up visit with PharmD in 6 months Will conduct general telephone calls for periodic check-ins before next visit.     Patient's daughter inquired on follow up visit with Dr. Ethlyn Gallery (either virtual or in-person). Sending separate message.  [name], PharmD Clinical Pharmacist [facility] [phone]

## 2019-05-04 NOTE — Patient Instructions (Addendum)
Visit Information  Goals Addressed            This Visit's Progress   . Pharmacy Care Plan       CARE PLAN ENTRY  Current Barriers:  . Chronic Disease Management support, education, and care coordination needs related to HTN, HLD, and Congestion, Osteoarthritis, Constipation  Pharmacist Clinical Goal(s):  Marland Kitchen Work with the care management team to address educational, disease management, and care coordination needs  . Call provider office for new or worsened signs and symptoms  . Over the next month, continue self health monitoring activities as directed today  . Call care management team with questions or concerns.  . Blood pressure:  Marland Kitchen Maintain blood pressure within goal of your provider (130/80 or 140/90)  . High cholesterol:  . Cholesterol goals: Total Cholesterol goal under 200, Triglycerides goal under 150, HDL goal above 40 (men) or above 50 (women), LDL goal under 100.  . Current cholesterol levels (01/12/2019): total cholesterol: 214; triglycerides: 250; HDL: 37; LDL: 120.  Marland Kitchen Acid reflux . Minimize reflux symptoms.  . Congestion . Reduce congestion symptoms.  . Osteoarthritis  . Continue to see improvement in pain level.  . Constipation . Minimize constipation and improve bowel movements.   Interventions: . Comprehensive medication review performed. . Evaluation of current treatment plans and patient's adherence to plan as established by provider . Assessed patient's education and care coordination needs . Provided disease specific education to patient . Blood pressure:  . Discussed need to continue checking blood pressure at home.  . Discussed diet modifications. DASH diet:  following a diet emphasizing fruits and vegetables and low-fat dairy products along with whole grains, fish, poultry, and nuts. Reducing red meats and sugars.  . Smoking cessation  . Exercising- see exercising section below  . Limiting the amount of alcohol to 1 or 2 drinks per day.   . Reducing  the amount of salt intake to 1500mg /per day.  . Recommend using a salt substitute to replace your salt if you need flavor.    . Getting enough potassium in your diet equaling 3500-5000mg /day.  This helps to regulate BP by balancing out the effects of salt.   . Weight reduction- We discussed losing 5-10% of body weight . Continue:  - amlodipine 5mg , 1 tablet daily  - atenolol 50mg , 1 tablet daily  - losartan/ HCTZ 100/12.5mg , 1 tablet daily  . High cholesterol . How to reduce cholesterol through diet/weight management and physical activity.    . We discussed how a diet high in plant sterols (fruits/vegetables/nuts/whole grains/legumes) may reduce your cholesterol.  Encouraged increasing fiber to a daily intake of 10-25g/day  . Continue: atorvastatin 20mg , 1 tablet daily  . Discussed non-pharmacological interventions for acid reflux. Take measures to prevent acid reflux, such as avoiding spicy foods, avoiding caffeine, avoid laying down a few hours after eating, and raising the head of the bed. . Continue: atorvastatin 20mg , 1 tablet daily  . GERD/ acid reflux . Discussed non-pharmacological interventions for acid reflux. Take measures to prevent acid reflux, such as avoiding spicy foods, avoiding caffeine, avoid laying down a few hours after eating, and raising the head of the bed. . Consider use of Tums or Pepcid as needed . Congestion . Continue:  - Flovent HFA inhaler, 2 puffs as needed - fluticasone nasal spray- as needed . Osteoarthritis . Continue: Tylenol 500mg  every six hours as needed  . Constipation . Continue: milk of magnesia as needed . Increase water intake and include "  roughage" into diet.   Patient Self Care Activities:  . Self administers medications as prescribed and Calls provider office for new concerns or questions . Continue current medications as directed by providers.  . Continue at home blood pressure readings. . Continue working on health habits (diet).    Initial goal documentation        Ms. Safer was given information about Chronic Care Management services today including:  1. CCM service includes personalized support from designated clinical staff supervised by her physician, including individualized plan of care and coordination with other care providers 2. 24/7 contact phone numbers for assistance for urgent and routine care needs. 3. Standard insurance, coinsurance, copays and deductibles apply for chronic care management only during months in which we provide at least 20 minutes of these services. Most insurances cover these services at 100%, however patients may be responsible for any copay, coinsurance and/or deductible if applicable. This service may help you avoid the need for more expensive face-to-face services. 4. Only one practitioner may furnish and bill the service in a calendar month. 5. The patient may stop CCM services at any time (effective at the end of the month) by phone call to the office staff.  Patient agreed to services and verbal consent obtained.   The patient verbalized understanding of instructions provided today and agreed to receive a mailed copy of patient instruction and/or educational materials.  Telephone follow up appointment with pharmacy team member scheduled for: 10/24/2019  [name], PharmD Clinical Pharmacist [facility] [place] [phone]   [place] stands for "Dietary Approaches to Stop Hypertension." The DASH eating plan is a healthy eating plan that has been shown to reduce high blood pressure (hypertension). It may also reduce your risk for type 2 diabetes, heart disease, and stroke. The DASH eating plan may also help with weight loss. What are tips for following this plan?  General guidelines  Avoid eating more than 2,300 mg (milligrams) of salt (sodium) a day. If you have hypertension, you may need to reduce your sodium intake to 1,500 mg a  day.  Limit alcohol intake to no more than 1 drink a day for nonpregnant women and 2 drinks a day for men. One drink equals 12 oz of beer, 5 oz of wine, or 1 oz of hard liquor.  Work with your health care provider to maintain a healthy body weight or to lose weight. Ask what an ideal weight is for you.  Get at least 30 minutes of exercise that causes your heart to beat faster (aerobic exercise) most days of the week. Activities may include walking, swimming, or biking.  Work with your health care provider or diet and nutrition specialist (dietitian) to adjust your eating plan to your individual calorie needs. Reading food labels   Check food labels for the amount of sodium per serving. Choose foods with less than 5 percent of the Daily Value of sodium. Generally, foods with less than 300 mg of sodium per serving fit into this eating plan.  To find whole grains, look for the word "whole" as the first word in the ingredient list. Shopping  Buy products labeled as "low-sodium" or "no salt added."  Buy fresh foods. Avoid canned foods and premade or frozen meals. Cooking  Avoid adding salt when cooking. Use salt-free seasonings or herbs instead of table salt or sea salt. Check with your health care provider or pharmacist before using salt substitutes.  Do not fry  foods. Lacinda Axon foods using healthy methods such as baking, boiling, grilling, and broiling instead.  Cook with heart-healthy oils, such as olive, canola, soybean, or sunflower oil. Meal planning  Eat a balanced diet that includes: ? 5 or more servings of fruits and vegetables each day. At each meal, try to fill half of your plate with fruits and vegetables. ? Up to 6-8 servings of whole grains each day. ? Less than 6 oz of lean meat, poultry, or fish each day. A 3-oz serving of meat is about the same size as a deck of cards. One egg equals 1 oz. ? 2 servings of low-fat dairy each day. ? A serving of nuts, seeds, or beans 5 times  each week. ? Heart-healthy fats. Healthy fats called Omega-3 fatty acids are found in foods such as flaxseeds and coldwater fish, like sardines, salmon, and mackerel.  Limit how much you eat of the following: ? Canned or prepackaged foods. ? Food that is high in trans fat, such as fried foods. ? Food that is high in saturated fat, such as fatty meat. ? Sweets, desserts, sugary drinks, and other foods with added sugar. ? Full-fat dairy products.  Do not salt foods before eating.  Try to eat at least 2 vegetarian meals each week.  Eat more home-cooked food and less restaurant, buffet, and fast food.  When eating at a restaurant, ask that your food be prepared with less salt or no salt, if possible. What foods are recommended? The items listed may not be a complete list. Talk with your dietitian about what dietary choices are best for you. Grains Whole-grain or whole-wheat bread. Whole-grain or whole-wheat pasta. Brown rice. Modena Morrow. Bulgur. Whole-grain and low-sodium cereals. Pita bread. Low-fat, low-sodium crackers. Whole-wheat flour tortillas. Vegetables Fresh or frozen vegetables (raw, steamed, roasted, or grilled). Low-sodium or reduced-sodium tomato and vegetable juice. Low-sodium or reduced-sodium tomato sauce and tomato paste. Low-sodium or reduced-sodium canned vegetables. Fruits All fresh, dried, or frozen fruit. Canned fruit in natural juice (without added sugar). Meat and other protein foods Skinless chicken or [place]. Ground chicken or [place]. Pork with fat trimmed off. Fish and seafood. Egg whites. Dried beans, peas, or lentils. Unsalted nuts, nut butters, and seeds. Unsalted canned beans. Lean cuts of beef with fat trimmed off. Low-sodium, lean deli meat. Dairy Low-fat (1%) or fat-free (skim) milk. Fat-free, low-fat, or reduced-fat cheeses. Nonfat, low-sodium ricotta or cottage cheese. Low-fat or nonfat yogurt. Low-fat, low-sodium cheese. Fats and oils Soft margarine  without trans fats. Vegetable oil. Low-fat, reduced-fat, or light mayonnaise and salad dressings (reduced-sodium). Canola, safflower, olive, soybean, and sunflower oils. Avocado. Seasoning and other foods Herbs. Spices. Seasoning mixes without salt. Unsalted popcorn and pretzels. Fat-free sweets. What foods are not recommended? The items listed may not be a complete list. Talk with your dietitian about what dietary choices are best for you. Grains Baked goods made with fat, such as croissants, muffins, or some breads. Dry pasta or rice meal packs. Vegetables Creamed or fried vegetables. Vegetables in a cheese sauce. Regular canned vegetables (not low-sodium or reduced-sodium). Regular canned tomato sauce and paste (not low-sodium or reduced-sodium). Regular tomato and vegetable juice (not low-sodium or reduced-sodium). Angie Fava. Olives. Fruits Canned fruit in a light or heavy syrup. Fried fruit. Fruit in cream or butter sauce. Meat and other protein foods Fatty cuts of meat. Ribs. Fried meat. Berniece Salines. Sausage. Bologna and other processed lunch meats. Salami. Fatback. Hotdogs. Bratwurst. Salted nuts and seeds. Canned beans with added salt. Canned  or smoked fish. Whole eggs or egg yolks. Chicken or [place] with skin. Dairy Whole or 2% milk, cream, and half-and-half. Whole or full-fat cream cheese. Whole-fat or sweetened yogurt. Full-fat cheese. Nondairy creamers. Whipped toppings. Processed cheese and cheese spreads. Fats and oils Butter. Stick margarine. Lard. Shortening. Ghee. Bacon fat. Tropical oils, such as coconut, palm kernel, or palm oil. Seasoning and other foods Salted popcorn and pretzels. Onion salt, garlic salt, seasoned salt, table salt, and sea salt. Worcestershire sauce. Tartar sauce. Barbecue sauce. Teriyaki sauce. Soy sauce, including reduced-sodium. Steak sauce. Canned and packaged gravies. Fish sauce. Oyster sauce. Cocktail sauce. Horseradish that you find on the shelf. Ketchup.  Mustard. Meat flavorings and tenderizers. Bouillon cubes. Hot sauce and Tabasco sauce. Premade or packaged marinades. Premade or packaged taco seasonings. Relishes. Regular salad dressings. Where to find more information:  National Heart, Lung, and [place]: https://wilson-eaton.com/  American Heart Association: www.heart.org Summary  The DASH eating plan is a healthy eating plan that has been shown to reduce high blood pressure (hypertension). It may also reduce your risk for type 2 diabetes, heart disease, and stroke.  With the DASH eating plan, you should limit salt (sodium) intake to 2,300 mg a day. If you have hypertension, you may need to reduce your sodium intake to 1,500 mg a day.  When on the DASH eating plan, aim to eat more fresh fruits and vegetables, whole grains, lean proteins, low-fat dairy, and heart-healthy fats.  Work with your health care provider or diet and nutrition specialist (dietitian) to adjust your eating plan to your individual calorie needs. This information is not intended to replace advice given to you by your health care provider. Make sure you discuss any questions you have with your health care provider. Document Revised: 01/09/2017 Document Reviewed: 01/21/2016 Elsevier Patient Education  2020 [employer].

## 2019-05-17 ENCOUNTER — Other Ambulatory Visit: Payer: Self-pay | Admitting: Family Medicine

## 2019-05-17 DIAGNOSIS — I10 Essential (primary) hypertension: Secondary | ICD-10-CM

## 2019-05-18 ENCOUNTER — Other Ambulatory Visit: Payer: Self-pay | Admitting: Family Medicine

## 2019-05-18 DIAGNOSIS — I10 Essential (primary) hypertension: Secondary | ICD-10-CM

## 2019-05-19 ENCOUNTER — Other Ambulatory Visit: Payer: Self-pay | Admitting: Family Medicine

## 2019-05-19 DIAGNOSIS — I10 Essential (primary) hypertension: Secondary | ICD-10-CM

## 2019-06-13 ENCOUNTER — Telehealth: Payer: Self-pay

## 2019-06-13 NOTE — Telephone Encounter (Signed)
Daughter Rhonda Weaver called to follow up from initial CCM visit (04/28/2019).   Reviewed patient's chart for most recent OV and consult visits. No changes since last CCM visit.  Reviewed Dr. Berenice Bouton last message regarding BP readings and plan for follow up. Dr. Ethlyn Gallery noted BP readings were improved from last in-office visit on 01/12/2019.   Recent BP and pulse readings reported.  129/54, 66 119/59; 71 147/66; 79 142/68. 76 130/66, 74 127/64, 64 125/53, 70 146/66, 70 133/62, 69 123/66, 81 134/66, 74 146/84, 67   Current BP meds:   Amlodipine 5mg , 1 tablet once daily  Atenolol 50mg , 1 tablet once daily   Losartan/ HCTZ 100/ 12.5mg , 1 tablet once daily   Per Dr. Ethlyn Gallery, patient to follow up in 3 months (from April) for in office or virtual visit.  Daughter to call office to schedule visit Rhonda Weaver) with Dr. Ethlyn Gallery.  [name], PharmD Clinical Pharmacist [facility] [place] [phone]

## 2019-08-04 ENCOUNTER — Other Ambulatory Visit: Payer: Self-pay | Admitting: Family Medicine

## 2019-08-04 DIAGNOSIS — I10 Essential (primary) hypertension: Secondary | ICD-10-CM

## 2019-08-05 ENCOUNTER — Other Ambulatory Visit: Payer: Self-pay | Admitting: Family Medicine

## 2019-08-05 DIAGNOSIS — I10 Essential (primary) hypertension: Secondary | ICD-10-CM

## 2019-08-17 ENCOUNTER — Telehealth (INDEPENDENT_AMBULATORY_CARE_PROVIDER_SITE_OTHER): Payer: PPO | Admitting: Family Medicine

## 2019-08-17 ENCOUNTER — Encounter: Payer: Self-pay | Admitting: Family Medicine

## 2019-08-17 VITALS — BP 140/69 | HR 72 | Wt 127.6 lb

## 2019-08-17 DIAGNOSIS — R739 Hyperglycemia, unspecified: Secondary | ICD-10-CM

## 2019-08-17 DIAGNOSIS — I10 Essential (primary) hypertension: Secondary | ICD-10-CM | POA: Diagnosis not present

## 2019-08-17 DIAGNOSIS — E782 Mixed hyperlipidemia: Secondary | ICD-10-CM

## 2019-08-17 DIAGNOSIS — K219 Gastro-esophageal reflux disease without esophagitis: Secondary | ICD-10-CM | POA: Diagnosis not present

## 2019-08-17 DIAGNOSIS — J309 Allergic rhinitis, unspecified: Secondary | ICD-10-CM

## 2019-08-17 MED ORDER — ATORVASTATIN CALCIUM 20 MG PO TABS: 20.0000 mg | ORAL_TABLET | Freq: Every day | ORAL | 1 refills | Status: DC

## 2019-08-17 NOTE — Progress Notes (Addendum)
Virtual Visit via Video Note  I connected with Lanita C Santee and her daughter Lovey Newcomer) on 08/17/19 at  1:30 PM EDT by a video enabled telemedicine application and verified that I am speaking with the correct person using two identifiers.  Location patient: home Location provider:work or home office Persons participating in the virtual visit: patient, provider  I discussed the limitations of evaluation and management by telemedicine and the availability of in person appointments. The patient expressed understanding and agreed to proceed.   Rhonda Weaver DOB: 04-10-1930 Encounter date: 08/17/2019  This is a 86 y.o. female who presents with Chief Complaint  Patient presents with  . Follow-up    History of present illness: HTN: norvasc 5mg , atenolol 50mg , losartan- hctz 100-12.5. 140/69. No issues with feeling light headed or dizzy.   Hyperglycemia: appetite has been good. She does weigh herself at home and last weight 127.6  GERD: hasn't been bothering her. Just occasional with trigger foods.   Recurrent bronchitis: no cough; breathing has been good overall.   HL: lipitor 20mg  daily. Doesn't have any side effects with this.   Seasonal allergies: flonase on board. Allergies not bothering her.   flu shot: done in September.  Has had both pneumonia vaccinations.  Tdap due 06/2023.  Has refused additional DEXA for Dr. Maudie Mercury.   Does drive still; just [place]. [place] [employer] [place]. Daughter does other shopping for her.     Allergies  Allergen Reactions  . Fenofibrate     REACTION: pt states intolerant  . Gemfibrozil     REACTION: pt states intolerant  . Niacin     REACTION: pt states intolerant  . Penicillins     REACTION: Hx of arm swelling \\T \ itching after shot  . Sulfamethoxazole-Trimethoprim     REACTION: "deathly sick"  w/  N\T\V  . Sulfonamide Derivatives     REACTION: nausea and vomiting   Current Meds  Medication Sig  .  acetaminophen (TYLENOL) 500 MG tablet Take 500 mg by mouth every 6 (six) hours as needed.  Marland Kitchen amLODipine (NORVASC) 5 MG tablet Take 1 tablet by mouth once daily  . aspirin 81 MG EC tablet Take 81 mg by mouth daily.    Marland Kitchen atenolol (TENORMIN) 50 MG tablet Take 1 tablet by mouth once daily  . atorvastatin (LIPITOR) 20 MG tablet Take 1 tablet (20 mg total) by mouth daily.  . Bismuth Subsalicylate (PEPTO-BISMOL PO) Take by mouth.  . fluticasone (FLONASE) 50 MCG/ACT nasal spray USE TWO SPRAY(S) IN EACH NOSTRIL ONCE DAILY  . fluticasone (FLOVENT HFA) 44 MCG/ACT inhaler Inhale 2 puffs into the lungs as needed.  Marland Kitchen losartan-hydrochlorothiazide (HYZAAR) 100-12.5 MG tablet Take 1 tablet by mouth once daily  . Magnesium Hydroxide (MILK OF MAGNESIA PO) Take by mouth.    Review of Systems  Constitutional: Negative for chills, fatigue and fever.  Respiratory: Negative for cough, chest tightness, shortness of breath and wheezing.   Cardiovascular: Negative for chest pain, palpitations and leg swelling.    Objective:  BP 140/69   Pulse 72   Wt 127 lb 9.6 oz (57.9 kg)   BMI 23.53 kg/m   Weight: 127 lb 9.6 oz (57.9 kg)   BP Readings from Last 3 Encounters:  08/17/19 140/69  01/12/19 (!) 168/60  03/25/18 138/68   Wt Readings from Last 3 Encounters:  08/17/19 127 lb 9.6 oz (57.9 kg)  01/12/19 124 lb 14.4 oz (56.7 kg)  03/25/18 124 lb 12.8  oz (56.6 kg)    EXAM:  GENERAL: alert, oriented, appears well and in no acute distress  HEENT: atraumatic, conjunctiva clear, no obvious abnormalities on inspection of external nose and ears  NECK: normal movements of the head and neck  LUNGS: on inspection no signs of respiratory distress, breathing rate appears normal, no obvious gross SOB, gasping or wheezing  CV: no obvious cyanosis  MS: moves all visible extremities without noticeable abnormality  PSYCH/NEURO: pleasant and cooperative, no obvious depression or anxiety, speech and thought processing  grossly intact   Assessment/Plan  1. HYPERLIPIDEMIA, MIXED We will recheck lab work.  She is doing well with Lipitor 20 mg. - atorvastatin (LIPITOR) 20 MG tablet; Take 1 tablet (20 mg total) by mouth daily.  Dispense: 90 tablet; Refill: 1 - Lipid panel; Future  2. Essential hypertension Systolic pressures a little higher than desired, but diastolic is on the lower end.  Encouraged him to keep checking at home.  I do not feel that medicine adjustment is necessary, given age and desire to avoid hypotension. - CBC with Differential/Platelet; Future - Comprehensive metabolic panel; Future  3. Gastroesophageal reflux disease, unspecified whether esophagitis present Stable and well-controlled, especially if she is following healthier diet habits.  4. Allergic rhinitis, unspecified seasonality, unspecified trigger Does well with Flonase.  Helps to control symptoms.  5. Hyperglycemia - Hemoglobin A1c; Future    Return in about 6 months (around 02/17/2020) for physical exam.   I discussed the assessment and treatment plan with the patient. The patient was provided an opportunity to ask questions and all were answered. The patient agreed with the plan and demonstrated an understanding of the instructions.   The patient was advised to call back or seek an in-person evaluation if the symptoms worsen or if the condition fails to improve as anticipated.  I provided 30 minutes of non-face-to-face time during this encounter.  Of note, patient refuses Covid vaccine and states she just does not want this.  Also not interested in having bone scan at this time.  Encouraged her to call if I can help discuss Covid vaccine with her.  We did discuss the benefits of this vaccine and how well tolerated it is.  Micheline Rough, MD

## 2019-08-18 ENCOUNTER — Telehealth: Payer: Self-pay | Admitting: *Deleted

## 2019-08-18 NOTE — Telephone Encounter (Signed)
I left a detailed message at the pts home number to call for appts as below.

## 2019-08-18 NOTE — Telephone Encounter (Signed)
-----   Message from Caren Macadam, MD sent at 08/17/2019  1:35 PM EDT ----- Schedule lab visit (soon) and then 6 mo physical

## 2019-08-25 NOTE — Telephone Encounter (Signed)
Error

## 2019-08-26 ENCOUNTER — Other Ambulatory Visit: Payer: Self-pay

## 2019-08-26 DIAGNOSIS — R739 Hyperglycemia, unspecified: Secondary | ICD-10-CM

## 2019-08-26 DIAGNOSIS — I10 Essential (primary) hypertension: Secondary | ICD-10-CM

## 2019-08-26 DIAGNOSIS — E782 Mixed hyperlipidemia: Secondary | ICD-10-CM

## 2019-08-29 ENCOUNTER — Other Ambulatory Visit: Payer: PPO

## 2019-09-05 ENCOUNTER — Other Ambulatory Visit: Payer: PPO

## 2019-09-06 ENCOUNTER — Other Ambulatory Visit: Payer: PPO

## 2019-09-08 ENCOUNTER — Other Ambulatory Visit: Payer: PPO

## 2019-09-19 ENCOUNTER — Other Ambulatory Visit: Payer: PPO

## 2019-09-21 ENCOUNTER — Ambulatory Visit: Payer: PPO | Admitting: Family Medicine

## 2019-09-22 ENCOUNTER — Other Ambulatory Visit: Payer: PPO

## 2019-10-20 ENCOUNTER — Telehealth: Payer: Self-pay | Admitting: Pharmacist

## 2019-10-20 NOTE — Progress Notes (Signed)
I left the patient a message about her upcoming appointment on 10/24/2019@ 1:00 pm with the clinical pharmacist. She was asked to please have all medication on had to review the pharmacist.

## 2019-10-24 ENCOUNTER — Ambulatory Visit: Payer: PPO

## 2019-10-24 DIAGNOSIS — E782 Mixed hyperlipidemia: Secondary | ICD-10-CM

## 2019-10-24 DIAGNOSIS — I10 Essential (primary) hypertension: Secondary | ICD-10-CM

## 2019-10-24 NOTE — Chronic Care Management (AMB) (Signed)
Chronic Care Management Pharmacy  Name: Rhonda Weaver   MRN: [id number] DOB: [birth year]  Initial Questions: 1. Have you seen any other providers since your last visit? Yes - video visit with Dr. Ethlyn Gallery 2. Any changes in your medicines or health? No   Chief Complaint/ HPI  Rhonda Weaver,  86 y.o. , female presents for their Follow-Up CCM visit with the clinical pharmacist via telephone due to COVID-19 Pandemic.  PCP : Caren Macadam, MD  Their chronic conditions include: HTN, HLD, GERD, Congestion, Osteoarthritis, Constipation  Office Visits: 08/17/19 Dr. Ethlyn Gallery - Patient presented via video visit for HTN follow up. Patient reported no dizziness or lightheadedness. Systolic BP a bit higher than desired but diastolic is on the lower end - no medication changes at this time to avoid hypotension. Follow up in 6 months for physical exam.  Medications: Outpatient Encounter Medications as of 10/24/2019  Medication Sig  . acetaminophen (TYLENOL) 500 MG tablet Take 500 mg by mouth every 6 (six) hours as needed.  Marland Kitchen amLODipine (NORVASC) 5 MG tablet Take 1 tablet by mouth once daily  . aspirin 81 MG EC tablet Take 81 mg by mouth daily.    Marland Kitchen atenolol (TENORMIN) 50 MG tablet Take 1 tablet by mouth once daily  . atorvastatin (LIPITOR) 20 MG tablet Take 1 tablet (20 mg total) by mouth daily.  . Bismuth Subsalicylate (PEPTO-BISMOL PO) Take by mouth.  . fluticasone (FLONASE) 50 MCG/ACT nasal spray USE TWO SPRAY(S) IN EACH NOSTRIL ONCE DAILY  . fluticasone (FLOVENT HFA) 44 MCG/ACT inhaler Inhale 2 puffs into the lungs as needed.  Marland Kitchen losartan-hydrochlorothiazide (HYZAAR) 100-12.5 MG tablet Take 1 tablet by mouth once daily  . Magnesium Hydroxide (MILK OF MAGNESIA PO) Take by mouth.   No facility-administered encounter medications on file as of 10/24/2019.    Current Diagnosis/Assessment:  Goals Addressed            This Visit's Progress   . Pharmacy Care Plan       CARE PLAN  ENTRY  Current Barriers:  . Chronic Disease Management support, education, and care coordination needs related to HTN, HLD, and Congestion, Osteoarthritis, Constipation  Pharmacist Clinical Goal(s):  Marland Kitchen Work with the care management team to address educational, disease management, and care coordination needs  . Call provider office for new or worsened signs and symptoms  . Over the next month, continue self health monitoring activities as directed today  . Call care management team with questions or concerns.  . Blood pressure:  Marland Kitchen Maintain blood pressure within goal of your provider (130/80 or 140/90)  . High cholesterol:  . Cholesterol goals: Total Cholesterol goal under 200, Triglycerides goal under 150, HDL goal above 40 (men) or above 50 (women), LDL goal under 100.  . Current cholesterol levels (01/12/2019): total cholesterol: 214; triglycerides: 250; HDL: 37; LDL: 120.  Marland Kitchen Acid reflux . Minimize reflux symptoms.  . Congestion . Reduce congestion symptoms.  . Osteoarthritis  . Continue to see improvement in pain level.  . Constipation . Minimize constipation and improve bowel movements.   Interventions: . Comprehensive medication review performed. . Evaluation of current treatment plans and patient's adherence to plan as established by provider . Assessed patient's education and care coordination needs . Provided disease specific education to patient . Blood pressure:  . Discussed need to continue checking blood pressure at home.  . Discussed diet modifications. DASH diet:  following a diet emphasizing fruits and vegetables and low-fat dairy products along  with whole grains, fish, poultry, and nuts. Reducing red meats and sugars.  . Smoking cessation  . Exercising- see exercising section below  . Limiting the amount of alcohol to 1 or 2 drinks per day.   . Reducing the amount of salt intake to 1500mg /per day.  . Recommend using a salt substitute to replace your salt if you  need flavor.    . Getting enough potassium in your diet equaling 3500-5000mg /day.  This helps to regulate BP by balancing out the effects of salt.   . Weight reduction- We discussed losing 5-10% of body weight . Continue:  - amlodipine 5mg , 1 tablet daily  - atenolol 50mg , 1 tablet daily  - losartan/ HCTZ 100/12.5mg , 1 tablet daily  . High cholesterol . How to reduce cholesterol through diet/weight management and physical activity.    . We discussed how a diet high in plant sterols (fruits/vegetables/nuts/whole grains/legumes) may reduce your cholesterol.  Encouraged increasing fiber to a daily intake of 10-25g/day  . Continue: atorvastatin 20mg , 1 tablet daily  . Discussed non-pharmacological interventions for acid reflux. Take measures to prevent acid reflux, such as avoiding spicy foods, avoiding caffeine, avoid laying down a few hours after eating, and raising the head of the bed. . Continue: atorvastatin 20mg , 1 tablet daily  . Recommended scheduling an appt for lab work to be completed. . GERD/ acid reflux . Discussed non-pharmacological interventions for acid reflux. Take measures to prevent acid reflux, such as avoiding spicy foods, avoiding caffeine, avoid laying down a few hours after eating, and raising the head of the bed. . Consider use of Tums or Pepcid as needed . Congestion . Continue:  - Flovent HFA inhaler, 2 puffs as needed - fluticasone nasal spray- as needed . Osteoarthritis . Continue: Tylenol 500mg  every six hours as needed  . Constipation . Continue: milk of magnesia as needed . Increase water intake and include "roughage" into diet.   Patient Self Care Activities:  . Self administers medications as prescribed and Calls provider office for new concerns or questions . Continue current medications as directed by providers.  . Continue at home blood pressure readings twice weekly. . Continue working on health habits (diet).  . Schedule appointment for lab work to  be completed.  Please see past updates related to this goal by clicking on the "Past Updates" button in the selected goal        SDOH Interventions     Most Recent Value  SDOH Interventions  Financial Strain Interventions Intervention Not Indicated  Transportation Interventions Intervention Not Indicated     Hypertension   Office blood pressures are: BP Readings from Last 3 Encounters:  08/17/19 140/69  01/12/19 (!) 168/60  03/25/18 138/68   Patient has failed these meds in the past: valsartan   Patient checks BP at home weekly  Patient home BP readings are ranging: 140/65-70 mmHg Daughter returned call for reported BP readings.  BP: 160/77 (7:30 pm), 153/77 (1PM) - taking all BP meds in the morning  Patient is uncontrolled on:  - amlodipine 5mg , 1 tablet daily  - atenolol 50mg , 1 tablet daily  - losartan/ HCTZ 100/12.5mg , 1 tablet daily   We discussed diet and exercise extensively.  Reducing the amount of salt intake to < 1500mg /per day.  . Recommend continued checking of label for sodium content to goal of < 1500mg /per day.   . Patient reports continued use of microwave meals and canned soup. Recommended looking for options with low sodium on  label.     Plan Denies dizziness/lightheadedness/HA/orthostatic hypotension.  Continue current medications  Check BP twice weekly and record - will follow up with CPA in one month for BP assessment  Hyperlipidemia   Lipid Panel     Component Value Date/Time   CHOL 214 (H) 01/12/2019 1113   TRIG 250.0 (H) 01/12/2019 1113   HDL 37.90 (L) 01/12/2019 1113   CHOLHDL 6 01/12/2019 1113   VLDL 50.0 (H) 01/12/2019 1113   LDLCALC 110 (H) 03/09/2017 1006   LDLDIRECT 120.0 01/12/2019 1113    The ASCVD Risk score (Goff DC Jr., et al., 2013) failed to calculate for the following reasons:   The 2013 ASCVD risk score is only valid for ages 15 to 66   Patient has failed these meds in past: fenofibrate, gemfibrozil, niacin  Patient  is currently uncontrolled on the following medications:  - atorvastatin 20mg , 1 tablet daily   We discussed:  diet and exercise extensively . Scheduling a visit for the lab to get cholesterol checked.  Plan Continue current medications and diet modifications.  Recommend making appointment for labs to be checked per Dr. Berenice Bouton note. Consider increasing to high intensity atorvastatin 40 mg daily depending on results of lipid panel.  GERD   Patient has failed these meds in past: dramamine  Patient is currently controlled on the following medications:  - Pepto-Bismol: uses for upset stomach - not currently using -Tums as needed for heartburn  We discussed:  recommended avoiding use of Pepto-Bismol (interaction with aspirin use- both are salicylates and may increase incidence of bleeding.)    Plan Continue using Tums as needed for heartburn.  Congestion  Patient has failed these meds in past: none  Patient is currently controlled on the following medications:  - Flovent HFA inhaler, 2 puffs as needed (patient uses as needed for severe bad congestion- last time.she used it was about 1 month ago).  - fluticasone nasal spray- uses as needing    Plan Continue current medications.   Osteoarthritis   Patient has failed these meds in past: none  Patient is currently controlled on the following medications:  - Tylenol 500mg  every six hours as needed   Plan Continue current medications.   Constipation   Patient has failed these meds in past: none  Patient is currently controlled on the following medications:  - milk of magnesia- (patient uses every 2 to 3 weeks as needed.)   Plan Continue current medications.   Vaccines   Reviewed and discussed patient's vaccination history.    Immunization History  Administered Date(s) Administered  . Influenza Split 11/11/2010, 11/21/2011, 10/11/2012  . Influenza Whole 11/03/2007, 11/17/2008, 11/13/2009  . Influenza, High Dose  Seasonal PF 11/07/2018  . Influenza-Unspecified 11/05/2013, 11/10/2016, 10/11/2017  . Pneumococcal Conjugate-13 12/06/2013  . Pneumococcal Polysaccharide-23 11/10/2016  . Tdap 07/01/2013    Patient reports getting her flu shot on Sept 4th at [facility]. Pt declines COVID vaccine.  Plan  Recommended patient receive Shingrix vaccine at pharmacy.    Medication Management   Pt uses [facility] for all medications Uses pill box? Yes Pt endorses 100% compliance   We discussed: considering the use of [facility] pharmacy. Patient wants to continue with her current pharmacy.  Plan  Continue current medication management strategy  Follow up: 3 month phone visit  Jeni Salles, PharmD Clinical Pharmacist [place] at [place] [phone]

## 2019-10-24 NOTE — Patient Instructions (Signed)
Visit Information  Goals Addressed            This Visit's Progress    Pharmacy Care Plan       CARE PLAN ENTRY  Current Barriers:   Chronic Disease Management support, education, and care coordination needs related to HTN, HLD, and Congestion, Osteoarthritis, Constipation  Pharmacist Clinical Goal(s):   Work with the care management team to address educational, disease management, and care coordination needs   Call provider office for new or worsened signs and symptoms   Over the next month, continue self health monitoring activities as directed today   Call care management team with questions or concerns.   Blood pressure:   Maintain blood pressure within goal of your provider (130/80 or 140/90)   High cholesterol:   Cholesterol goals: Total Cholesterol goal under 200, Triglycerides goal under 150, HDL goal above 40 (men) or above 50 (women), LDL goal under 100.   Current cholesterol levels (01/12/2019): total cholesterol: 214; triglycerides: 250; HDL: 37; LDL: 120.   Acid reflux  Minimize reflux symptoms.   Congestion  Reduce congestion symptoms.   Osteoarthritis   Continue to see improvement in pain level.   Constipation  Minimize constipation and improve bowel movements.   Interventions:  Comprehensive medication review performed.  Evaluation of current treatment plans and patient's adherence to plan as established by provider  Assessed patient's education and care coordination needs  Provided disease specific education to patient  Blood pressure:   Discussed need to continue checking blood pressure at home.   Discussed diet modifications. DASH diet:  following a diet emphasizing fruits and vegetables and low-fat dairy products along with whole grains, fish, poultry, and nuts. Reducing red meats and sugars.   Smoking cessation   Exercising- see exercising section below   Limiting the amount of alcohol to 1 or 2 drinks per day.    Reducing  the amount of salt intake to 1500mg /per day.   Recommend using a salt substitute to replace your salt if you need flavor.     Getting enough potassium in your diet equaling 3500-5000mg /day.  This helps to regulate BP by balancing out the effects of salt.    Weight reduction- We discussed losing 5-10% of body weight  Continue:  - amlodipine 5mg , 1 tablet daily  - atenolol 50mg , 1 tablet daily  - losartan/ HCTZ 100/12.5mg , 1 tablet daily   High cholesterol  How to reduce cholesterol through diet/weight management and physical activity.     We discussed how a diet high in plant sterols (fruits/vegetables/nuts/whole grains/legumes) may reduce your cholesterol.  Encouraged increasing fiber to a daily intake of 10-25g/day   Continue: atorvastatin 20mg , 1 tablet daily   Discussed non-pharmacological interventions for acid reflux. Take measures to prevent acid reflux, such as avoiding spicy foods, avoiding caffeine, avoid laying down a few hours after eating, and raising the head of the bed.  Continue: atorvastatin 20mg , 1 tablet daily   Recommended scheduling an appt for lab work to be completed.  GERD/ acid reflux  Discussed non-pharmacological interventions for acid reflux. Take measures to prevent acid reflux, such as avoiding spicy foods, avoiding caffeine, avoid laying down a few hours after eating, and raising the head of the bed.  Consider use of Tums or Pepcid as needed  Congestion  Continue:  - Flovent HFA inhaler, 2 puffs as needed - fluticasone nasal spray- as needed  Osteoarthritis  Continue: Tylenol 500mg  every six hours as needed   Constipation  Continue:  milk of magnesia as needed  Increase water intake and include "roughage" into diet.   Patient Self Care Activities:   Self administers medications as prescribed and Calls provider office for new concerns or questions  Continue current medications as directed by providers.   Continue at home blood  pressure readings twice weekly.  Continue working on health habits (diet).   Schedule appointment for lab work to be completed.  Please see past updates related to this goal by clicking on the "Past Updates" button in the selected goal         The patient verbalized understanding of instructions provided today and declined a print copy of patient instruction materials.   The pharmacy team will reach out to the patient again over the next 30 days.   Jeni Salles, PharmD Clinical Pharmacist [place] at [place]

## 2019-12-21 ENCOUNTER — Other Ambulatory Visit: Payer: PPO

## 2019-12-22 ENCOUNTER — Other Ambulatory Visit: Payer: PPO

## 2019-12-23 ENCOUNTER — Other Ambulatory Visit: Payer: PPO

## 2019-12-27 ENCOUNTER — Other Ambulatory Visit: Payer: PPO

## 2020-01-20 ENCOUNTER — Telehealth: Payer: PPO

## 2020-01-20 NOTE — Chronic Care Management (AMB) (Incomplete)
Chronic Care Management Pharmacy  Name: BLAKELYNN SCHEELER   MRN: [id number] DOB: February 27, 1930  Initial Questions: 1. Have you seen any other providers since your last visit? No  2. Any changes in your medicines or health? No   Chief Complaint/ HPI  Rhonda Weaver,  86 y.o. , female presents for their Follow-Up CCM visit with the clinical pharmacist via telephone due to COVID-19 Pandemic.  PCP : Caren Macadam, MD  Their chronic conditions include: HTN, HLD, GERD, Congestion, Osteoarthritis, Constipation  Office Visits: 08/17/19 Dr. Ethlyn Gallery - Patient presented via video visit for HTN follow up. Patient reported no dizziness or lightheadedness. Systolic BP a bit higher than desired but diastolic is on the lower end - no medication changes at this time to avoid hypotension. Follow up in 6 months for physical exam.  Medications: Outpatient Encounter Medications as of 01/20/2020  Medication Sig  . acetaminophen (TYLENOL) 500 MG tablet Take 500 mg by mouth every 6 (six) hours as needed.  Marland Kitchen amLODipine (NORVASC) 5 MG tablet Take 1 tablet by mouth once daily  . aspirin 81 MG EC tablet Take 81 mg by mouth daily.    Marland Kitchen atenolol (TENORMIN) 50 MG tablet Take 1 tablet by mouth once daily  . atorvastatin (LIPITOR) 20 MG tablet Take 1 tablet (20 mg total) by mouth daily.  . Bismuth Subsalicylate (PEPTO-BISMOL PO) Take by mouth.  . fluticasone (FLONASE) 50 MCG/ACT nasal spray USE TWO SPRAY(S) IN EACH NOSTRIL ONCE DAILY  . fluticasone (FLOVENT HFA) 44 MCG/ACT inhaler Inhale 2 puffs into the lungs as needed.  Marland Kitchen losartan-hydrochlorothiazide (HYZAAR) 100-12.5 MG tablet Take 1 tablet by mouth once daily  . Magnesium Hydroxide (MILK OF MAGNESIA PO) Take by mouth.   No facility-administered encounter medications on file as of 01/20/2020.    Current Diagnosis/Assessment:  Goals Addressed   None     Hypertension   Office blood pressures are: BP Readings from Last 3 Encounters:  08/17/19  140/69  01/12/19 (!) 168/60  03/25/18 138/68   Patient has failed these meds in the past: valsartan   Patient checks BP at home weekly  Patient home BP readings are ranging: 140/65-70 mmHg Daughter returned call for reported BP readings.  BP: 160/77 (7:30 pm), 153/77 (1PM) - taking all BP meds in the morning  Patient is uncontrolled on:  - amlodipine 5mg , 1 tablet daily  - atenolol 50mg , 1 tablet daily  - losartan/ HCTZ 100/12.5mg , 1 tablet daily   We discussed diet and exercise extensively.  Reducing the amount of salt intake to < 1500mg /per day.  . Recommend continued checking of label for sodium content to goal of < 1500mg /per day.   . Patient reports continued use of microwave meals and canned soup. Recommended looking for options with low sodium on label.     Plan Denies dizziness/lightheadedness/HA/orthostatic hypotension.  Continue current medications   Hyperlipidemia   Lipid Panel     Component Value Date/Time   CHOL 214 (H) 01/12/2019 1113   TRIG 250.0 (H) 01/12/2019 1113   HDL 37.90 (L) 01/12/2019 1113   CHOLHDL 6 01/12/2019 1113   VLDL 50.0 (H) 01/12/2019 1113   LDLCALC 110 (H) 03/09/2017 1006   LDLDIRECT 120.0 01/12/2019 1113    The ASCVD Risk score (Goff DC Jr., et al., 2013) failed to calculate for the following reasons:   The 2013 ASCVD risk score is only valid for ages 75 to 52   Patient has failed these meds in past: fenofibrate, gemfibrozil, niacin  Patient is currently uncontrolled on the following medications:  - atorvastatin 20mg , 1 tablet daily   We discussed:  diet and exercise extensively . Scheduling a visit for the lab to get cholesterol checked.  Plan Continue current medications and diet modifications.  Recommend making appointment for labs to be checked per Dr. Berenice Bouton note. Consider increasing to high intensity atorvastatin 40 mg daily depending on results of lipid panel.  GERD   Patient has failed these meds in past: dramamine   Patient is currently controlled on the following medications:  - Pepto-Bismol: uses for upset stomach - not currently using -Tums as needed for heartburn  We discussed:  recommended avoiding use of Pepto-Bismol (interaction with aspirin use- both are salicylates and may increase incidence of bleeding.)    Plan Continue using Tums as needed for heartburn.  Congestion  Patient has failed these meds in past: none  Patient is currently controlled on the following medications:  - Flovent HFA inhaler, 2 puffs as needed (patient uses as needed for severe bad congestion- last time.she used it was about 1 month ago).  - fluticasone nasal spray- uses as needing    Plan Continue current medications.   Osteoarthritis   Patient has failed these meds in past: none  Patient is currently controlled on the following medications:  - Tylenol 500mg  every six hours as needed   Plan Continue current medications.   Constipation   Patient has failed these meds in past: none  Patient is currently controlled on the following medications:  - milk of magnesia- (patient uses every 2 to 3 weeks as needed.)   Plan Continue current medications.   Vaccines   Reviewed and discussed patient's vaccination history.    Immunization History  Administered Date(s) Administered  . Influenza Split 11/11/2010, 11/21/2011, 10/11/2012  . Influenza Whole 11/03/2007, 11/17/2008, 11/13/2009  . Influenza, High Dose Seasonal PF 11/07/2018  . Influenza-Unspecified 11/05/2013, 11/10/2016, 10/11/2017  . Pneumococcal Conjugate-13 12/06/2013  . Pneumococcal Polysaccharide-23 11/10/2016  . Tdap 07/01/2013    Patient reports getting her flu shot on Sept 4th at [facility]. Pt declines COVID vaccine.  Plan  Recommended patient receive Shingrix vaccine at pharmacy.    Medication Management   Pt uses [facility] pharmacy for all medications Uses pill box? Yes Pt endorses 100% compliance   We discussed: considering the use of  [facility] pharmacy. Patient wants to continue with her current pharmacy.  Plan  Continue current medication management strategy  Follow up: 3 month phone visit  Jeni Salles, PharmD Clinical Pharmacist [place] at [place] [phone]

## 2020-01-26 ENCOUNTER — Other Ambulatory Visit: Payer: Self-pay | Admitting: Family Medicine

## 2020-01-26 DIAGNOSIS — I10 Essential (primary) hypertension: Secondary | ICD-10-CM

## 2020-02-13 ENCOUNTER — Other Ambulatory Visit: Payer: Self-pay | Admitting: Family Medicine

## 2020-02-13 DIAGNOSIS — I10 Essential (primary) hypertension: Secondary | ICD-10-CM

## 2020-02-14 ENCOUNTER — Other Ambulatory Visit: Payer: Self-pay | Admitting: Family Medicine

## 2020-02-14 DIAGNOSIS — I10 Essential (primary) hypertension: Secondary | ICD-10-CM

## 2020-02-14 MED ORDER — ATENOLOL 50 MG PO TABS: 50.0000 mg | ORAL_TABLET | Freq: Every day | ORAL | 0 refills | Status: DC

## 2020-02-14 NOTE — Addendum Note (Signed)
Addended by: Johnella Moloney on: 02/14/2020 02:41 PM   Modules accepted: Orders

## 2020-03-07 ENCOUNTER — Ambulatory Visit: Payer: PPO | Admitting: Family Medicine

## 2020-03-12 ENCOUNTER — Ambulatory Visit: Payer: PPO | Admitting: Family Medicine

## 2020-03-12 DIAGNOSIS — Z0289 Encounter for other administrative examinations: Secondary | ICD-10-CM

## 2020-03-12 NOTE — Progress Notes (Deleted)
  PORCHA DEBLANC DOB: 1930/03/25 Encounter date: 03/12/2020  This is a 87 y.o. female who presents with No chief complaint on file.   History of present illness: HTN: norvasc 5mg , atenolol 50mg , losartan- hctz 100-12.5. 140/69. No issues with feeling light headed or dizzy.   Hyperglycemia:appetite has been good. She does weigh herself at home and last weight 127.6  GERD:hasn't been bothering her. Just occasional with trigger foods.   Recurrent bronchitis: no cough; breathing has been good overall.   HL: lipitor 20mg  daily. Doesn't have any side effects with this.   Seasonal allergies: flonase on board. Allergies not bothering her.  HPI   Allergies  Allergen Reactions  . Fenofibrate     REACTION: pt states intolerant  . Gemfibrozil     REACTION: pt states intolerant  . Niacin     REACTION: pt states intolerant  . Penicillins     REACTION: Hx of arm swelling \\T \ itching after shot  . Sulfamethoxazole-Trimethoprim     REACTION: "deathly sick"  w/  N\T\V  . Sulfonamide Derivatives     REACTION: nausea and vomiting   No outpatient medications have been marked as taking for the 03/12/20 encounter (Appointment) with Caren Macadam, MD.    Review of Systems  Objective:  There were no vitals taken for this visit.      BP Readings from Last 3 Encounters:  08/17/19 140/69  01/12/19 (!) 168/60  03/25/18 138/68   Wt Readings from Last 3 Encounters:  08/17/19 127 lb 9.6 oz (57.9 kg)  01/12/19 124 lb 14.4 oz (56.7 kg)  03/25/18 124 lb 12.8 oz (56.6 kg)    Physical Exam  Assessment/Plan  There are no diagnoses linked to this encounter.       Micheline Rough, MD

## 2020-03-22 ENCOUNTER — Other Ambulatory Visit: Payer: Self-pay | Admitting: Family Medicine

## 2020-03-22 DIAGNOSIS — I10 Essential (primary) hypertension: Secondary | ICD-10-CM

## 2020-03-24 ENCOUNTER — Other Ambulatory Visit: Payer: Self-pay | Admitting: Family Medicine

## 2020-03-24 DIAGNOSIS — I10 Essential (primary) hypertension: Secondary | ICD-10-CM

## 2020-03-28 ENCOUNTER — Other Ambulatory Visit: Payer: Self-pay | Admitting: Family Medicine

## 2020-03-28 DIAGNOSIS — I10 Essential (primary) hypertension: Secondary | ICD-10-CM

## 2020-03-31 ENCOUNTER — Other Ambulatory Visit: Payer: Self-pay | Admitting: Family Medicine

## 2020-03-31 DIAGNOSIS — E782 Mixed hyperlipidemia: Secondary | ICD-10-CM

## 2020-04-02 NOTE — Telephone Encounter (Signed)
Patient is calling to check the status of medication refill, please advise. CB is [phone]

## 2020-04-04 ENCOUNTER — Telehealth: Payer: Self-pay | Admitting: Pharmacist

## 2020-04-04 NOTE — Chronic Care Management (AMB) (Signed)
Chronic Care Management Pharmacy Assistant   Name: SANSA ALKEMA  MRN: [id number] DOB: 11-09-1930  Reason for Encounter: Hypertension adherence call  Patient Questions:  1.  Have you seen any other providers since your last visit? No  2.  Any changes in your medicines or health? No   PCP : Caren Macadam, MD  Allergies:   Allergies  Allergen Reactions  . Fenofibrate     REACTION: pt states intolerant  . Gemfibrozil     REACTION: pt states intolerant  . Niacin     REACTION: pt states intolerant  . Penicillins     REACTION: Hx of arm swelling \\T \ itching after shot  . Sulfamethoxazole-Trimethoprim     REACTION: "deathly sick"  w/  N\T\V  . Sulfonamide Derivatives     REACTION: nausea and vomiting    Medications: Outpatient Encounter Medications as of 04/04/2020  Medication Sig  . acetaminophen (TYLENOL) 500 MG tablet Take 500 mg by mouth every 6 (six) hours as needed.  Marland Kitchen amLODipine (NORVASC) 5 MG tablet Take 1 tablet by mouth once daily  . aspirin 81 MG EC tablet Take 81 mg by mouth daily.    Marland Kitchen atenolol (TENORMIN) 50 MG tablet Take 1 tablet (50 mg total) by mouth daily.  Marland Kitchen atorvastatin (LIPITOR) 20 MG tablet Take 1 tablet by mouth once daily  . Bismuth Subsalicylate (PEPTO-BISMOL PO) Take by mouth.  . fluticasone (FLONASE) 50 MCG/ACT nasal spray USE TWO SPRAY(S) IN EACH NOSTRIL ONCE DAILY  . fluticasone (FLOVENT HFA) 44 MCG/ACT inhaler Inhale 2 puffs into the lungs as needed.  Marland Kitchen losartan-hydrochlorothiazide (HYZAAR) 100-12.5 MG tablet Take 1 tablet by mouth once daily  . Magnesium Hydroxide (MILK OF MAGNESIA PO) Take by mouth.   No facility-administered encounter medications on file as of 04/04/2020.    Current Diagnosis: Patient Active Problem List   Diagnosis Date Noted  . Allergic rhinitis 09/28/2007  . Osteoarthritis 09/28/2007  . HYPERLIPIDEMIA, MIXED 10/15/2006  . Essential hypertension 10/15/2006  . GERD 10/15/2006    Goals Addressed   None     Reviewed chart prior to disease state call. Spoke with patient regarding BP  Recent Office Vitals: BP Readings from Last 3 Encounters:  08/17/19 140/69  01/12/19 (!) 168/60  03/25/18 138/68   Pulse Readings from Last 3 Encounters:  08/17/19 72  01/12/19 74  03/25/18 74    Wt Readings from Last 3 Encounters:  08/17/19 127 lb 9.6 oz (57.9 kg)  01/12/19 124 lb 14.4 oz (56.7 kg)  03/25/18 124 lb 12.8 oz (56.6 kg)     Kidney Function Lab Results  Component Value Date/Time   CREATININE 0.89 01/12/2019 11:13 AM   CREATININE 0.98 03/25/2018 09:56 AM   GFR 60.13 01/12/2019 11:13 AM   GFRNONAA 65 03/06/2008 08:13 AM   GFRAA 78 03/06/2008 08:13 AM    BMP Latest Ref Rng & Units 01/12/2019 03/25/2018 11/17/2017  Glucose 70 - 99 mg/dL 108(H) 105(H) 118(H)  BUN 6 - 23 mg/dL 10 19 14   Creatinine 0.40 - 1.20 mg/dL 0.89 0.98 0.97  Sodium 135 - 145 mEq/L 140 139 137  Potassium 3.5 - 5.1 mEq/L 3.8 4.5 4.2  Chloride 96 - 112 mEq/L 102 97 97  CO2 19 - 32 mEq/L 30 35(H) 33(H)  Calcium 8.4 - 10.5 mg/dL 9.5 9.7 9.6    . Current antihypertensive regimen:  o Amlodipine 5 mg, 1 tablet daily  o Atenolol 50 mg, 1 tablet daily  o Losartan/ HCTZ  100/12.5 mg, 1 tablet daily  . How often are you checking your Blood Pressure? infrequently . Current home BP readings:  o 01/30 157/74 o 02/03 119/52 o 02/09 148/69 o 02/15 171/77 . What recent interventions/DTPs have been made by any provider to improve Blood Pressure control since last CPP Visit: None . Any recent hospitalizations or ED visits since last visit with CPP? No . What diet changes have been made to improve Blood Pressure Control?  o No changes . What exercise is being done to improve your Blood Pressure Control?  o No Change  Adherence Review: Is the patient currently on ACE/ARB medication? Yes Does the patient have >5 day gap between last estimated fill dates? No  Follow-Up:  Pharmacist Review and Scheduled Follow-Up With  Clinical Pharmacist  I spoke with the patient's daughter [name] who is her caregiver (HIPPA) and discussed medication adherence with the patient, no issues at this time with current medication. Her daughter states that she's recently scheduled an appointment with her PCP. Her mother missed this appointment back in January. Her daughter says she is working more closely with her mother this year and her medications period to make sure that she takes them. The patient denies any side effects with her medication. Also, denies any problems with her current pharmacy.  Maia Breslow, [place] Assistant [phone]

## 2020-05-11 ENCOUNTER — Ambulatory Visit: Payer: PPO | Admitting: Family Medicine

## 2020-05-17 ENCOUNTER — Telehealth: Payer: Self-pay | Admitting: Pharmacist

## 2020-05-17 NOTE — Progress Notes (Signed)
Note opened in error.

## 2020-05-17 NOTE — Chronic Care Management (AMB) (Signed)
Chronic Care Management Pharmacy Assistant   Name: Rhonda Weaver  MRN: [id number] DOB: 05-06-1930   Reason for Encounter: Hypertension Adherence Call    Recent office visits:  None  Recent consult visits:  None  Hospital visits:  None in previous 6 months  Medications: Outpatient Encounter Medications as of 05/17/2020  Medication Sig  . acetaminophen (TYLENOL) 500 MG tablet Take 500 mg by mouth every 6 (six) hours as needed.  Marland Kitchen amLODipine (NORVASC) 5 MG tablet Take 1 tablet by mouth once daily  . aspirin 81 MG EC tablet Take 81 mg by mouth daily.    Marland Kitchen atenolol (TENORMIN) 50 MG tablet Take 1 tablet (50 mg total) by mouth daily.  Marland Kitchen atorvastatin (LIPITOR) 20 MG tablet Take 1 tablet by mouth once daily  . Bismuth Subsalicylate (PEPTO-BISMOL PO) Take by mouth.  . fluticasone (FLONASE) 50 MCG/ACT nasal spray USE TWO SPRAY(S) IN EACH NOSTRIL ONCE DAILY  . fluticasone (FLOVENT HFA) 44 MCG/ACT inhaler Inhale 2 puffs into the lungs as needed.  Marland Kitchen losartan-hydrochlorothiazide (HYZAAR) 100-12.5 MG tablet Take 1 tablet by mouth once daily  . Magnesium Hydroxide (MILK OF MAGNESIA PO) Take by mouth.   No facility-administered encounter medications on file as of 05/17/2020.    Reviewed chart prior to disease state call. Spoke with patient regarding BP  Recent Office Vitals: BP Readings from Last 3 Encounters:  08/17/19 140/69  01/12/19 (!) 168/60  03/25/18 138/68   Pulse Readings from Last 3 Encounters:  08/17/19 72  01/12/19 74  03/25/18 74    Wt Readings from Last 3 Encounters:  08/17/19 127 lb 9.6 oz (57.9 kg)  01/12/19 124 lb 14.4 oz (56.7 kg)  03/25/18 124 lb 12.8 oz (56.6 kg)     Kidney Function Lab Results  Component Value Date/Time   CREATININE 0.89 01/12/2019 11:13 AM   CREATININE 0.98 03/25/2018 09:56 AM   GFR 60.13 01/12/2019 11:13 AM   GFRNONAA 65 03/06/2008 08:13 AM   GFRAA 78 03/06/2008 08:13 AM    BMP Latest Ref Rng & Units 01/12/2019 03/25/2018  11/17/2017  Glucose 70 - 99 mg/dL 108(H) 105(H) 118(H)  BUN 6 - 23 mg/dL 10 19 14   Creatinine 0.40 - 1.20 mg/dL 0.89 0.98 0.97  Sodium 135 - 145 mEq/L 140 139 137  Potassium 3.5 - 5.1 mEq/L 3.8 4.5 4.2  Chloride 96 - 112 mEq/L 102 97 97  CO2 19 - 32 mEq/L 30 35(H) 33(H)  Calcium 8.4 - 10.5 mg/dL 9.5 9.7 9.6    . Current antihypertensive regimen:   Amlodipine 5 mg, 1 tablet daily   Atenolol 50 mg, 1 tablet daily   Losartan/ HCTZ 100/12.5 mg, 1 tablet daily   . How often are you checking your Blood Pressure? weekly  . Current home BP readings: 152/68 taken on 04/11/20 at 3:15 PM. Pulse 68. Patient's daughter stated she will take another reading. Blood pressure taken on 05/18/20; 156/65, pulse 77.  . What recent interventions/DTPs have been made by any provider to improve Blood Pressure control since last CPP Visit: Patient reports she is taking her medications as directed.   . Any recent hospitalizations or ED visits since last visit with CPP? No   . What diet changes have been made to improve Blood Pressure Control?  o Patient reports she is not eating any cakes, sugars, eats no sugar added icecream or low fat. Eats chicken noodle soup, for supper she eats a steak and veggies once in awhile when going  out to eat.   . What exercise is being done to improve your Blood Pressure Control?  o Patient reports she walks around in her yard, through her garden and to the mailbox for 15 to 20 minutes.  Adherence Review: Is the patient currently on ACE/ARB medication? No Does the patient have >5 day gap between last estimated fill dates? No    Star Rating Drugs: Atorvastatin 20 MG: 90 DS, last filled on 04/02/20 at [facility].  05/17/20-Called and spoke with patient's daughter regarding the patient blood pressure monitoring. Patient's daughter stated that patient is capable of answering questions pertaining to her blood pressure but was willing to assist on call with additional  information needed. Patient's daughter voiced she typically goes to the patient house to check her blood pressure and track medication refills. Patient's daughter only could provide a blood pressure reading from April 11, 2020 but requested to call me back tomorrow for an updated blood pressure reading and if the patient is needing any readings.  05/18/20-Returned the patient's daughter call from today, no answer, goes straight to voicemail. Left a message for a call back.  Called and spoke with patient regarding her blood pressure monitoring. Patient voiced her blood pressure has been stable. I informed the patient I attempted to outreach her daughter for medication refills and a updated blood pressure reading. Patient stated her daughter left town for the weekend and should be returning back on Monday.  05/23/20-Called and spoke with the patient's daughter; patient blood pressure was checked by daughter last week on Friday, 05/18/20. Blood pressure reading was 156/65, pulse 77. Patient's daughter voiced patient doesn't need any refills at this time.  Orlando Penner, CPP Notified.  Raynelle Highland, [place] Pharmacist Assistant [phone] CCM Total Time: 48 minutes

## 2020-06-08 ENCOUNTER — Ambulatory Visit: Payer: PPO | Admitting: Family Medicine

## 2020-06-18 ENCOUNTER — Telehealth: Payer: Self-pay | Admitting: Pharmacist

## 2020-06-18 NOTE — Chronic Care Management (AMB) (Addendum)
I spoke with the patient's daughter about her upcoming appointment on 06/19/2020@ 11:00 am with the clinical pharmacist. She was asked to please have all medication on hand to review with the pharmacist. She confirmed the appointment. Patient requested for the pharmacist to reach her on the mobile number that is in the chart.   Maia Breslow, [place] [phone]

## 2020-06-19 ENCOUNTER — Ambulatory Visit (INDEPENDENT_AMBULATORY_CARE_PROVIDER_SITE_OTHER): Payer: PPO | Admitting: Pharmacist

## 2020-06-19 DIAGNOSIS — E782 Mixed hyperlipidemia: Secondary | ICD-10-CM | POA: Diagnosis not present

## 2020-06-19 DIAGNOSIS — I10 Essential (primary) hypertension: Secondary | ICD-10-CM | POA: Diagnosis not present

## 2020-06-19 NOTE — Progress Notes (Signed)
Chronic Care Management Pharmacy Note  06/19/2020 Name:  Rhonda Weaver MRN:  [id number] DOB:  13-Jun-1930  Subjective: Rhonda Weaver is an 87 y.o. year old female who is a primary patient of Koberlein, Steele Berg, MD.  The CCM team was consulted for assistance with disease management and care coordination needs.    Engaged with patient by telephone for follow up visit in response to provider referral for pharmacy case management and/or care coordination services.   Consent to Services:  The patient was given information about Chronic Care Management services, agreed to services, and gave verbal consent prior to initiation of services.  Please see initial visit note for detailed documentation.   Patient Care Team: Caren Macadam, MD as PCP - General (Family Medicine) Viona Gilmore, [facility] as Pharmacist (Pharmacist)  Recent office visits: 08/17/19 Dr. Ethlyn Gallery - Patient presented via video visit for HTN follow up. Patient reported no dizziness or lightheadedness. Systolic BP a bit higher than desired but diastolic is on the lower end - no medication changes at this time to avoid hypotension. Follow up in 6 months for physical exam.  Recent consult visits: none  Hospital visits: None in previous 6 months  Objective:  Lab Results  Component Value Date   CREATININE 0.89 01/12/2019   BUN 10 01/12/2019   GFR 60.13 01/12/2019   GFRNONAA 65 03/06/2008   GFRAA 78 03/06/2008   NA 140 01/12/2019   K 3.8 01/12/2019   CALCIUM 9.5 01/12/2019   CO2 30 01/12/2019   GLUCOSE 108 (H) 01/12/2019    Lab Results  Component Value Date/Time   HGBA1C 6.1 01/12/2019 11:13 AM   HGBA1C 6.3 03/25/2018 09:56 AM   GFR 60.13 01/12/2019 11:13 AM   GFR 53.91 (L) 03/25/2018 09:56 AM    Last diabetic Eye exam: No results found for: HMDIABEYEEXA  Last diabetic Foot exam: No results found for: HMDIABFOOTEX   Lab Results  Component Value Date   CHOL 214 (H) 01/12/2019   HDL 37.90 (L)  01/12/2019   LDLCALC 110 (H) 03/09/2017   LDLDIRECT 120.0 01/12/2019   TRIG 250.0 (H) 01/12/2019   CHOLHDL 6 01/12/2019    Hepatic Function Latest Ref Rng & Units 01/12/2019 03/11/2013 12/31/2011  Total Protein 6.0 - 8.3 g/dL 6.7 6.9 7.7  Albumin 3.5 - 5.2 g/dL 4.0 3.5 3.5  AST 0 - 37 U/L $Remo'25 18 21  'wxVDK$ ALT 0 - 35 U/L $Remo'26 16 15  'XZobi$ Alk Phosphatase 39 - 117 U/L 46 44 52  Total Bilirubin 0.2 - 1.2 mg/dL 0.7 0.8 0.7  Bilirubin, Direct 0.0 - 0.3 mg/dL - 0.0 0.1    Lab Results  Component Value Date/Time   TSH 1.13 01/12/2019 11:13 AM   TSH 2.33 03/11/2013 08:17 AM    CBC Latest Ref Rng & Units 01/12/2019 11/17/2017 03/09/2017  WBC 4.0 - 10.5 K/uL 7.1 6.3 6.1  Hemoglobin 12.0 - 15.0 g/dL 13.6 14.2 14.1  Hematocrit 36.0 - 46.0 % 40.1 42.6 41.4  Platelets 150.0 - 400.0 K/uL 235.0 234.0 246.0    Lab Results  Component Value Date/Time   VD25OH 31 12/31/2011 08:52 AM    Clinical ASCVD: No  The ASCVD Risk score (Goff DC Jr., et al., 2013) failed to calculate for the following reasons:   The 2013 ASCVD risk score is only valid for ages 30 to 15    Depression screen PHQ 2/9 08/17/2019 04/07/2017 07/29/2016  Decreased Interest 0 0 0  Down, Depressed, Hopeless 0 0 0  PHQ - 2 Score 0 0 0      Social History   Tobacco Use  Smoking Status Never Smoker  Smokeless Tobacco Never Used   BP Readings from Last 3 Encounters:  08/17/19 140/69  01/12/19 (!) 168/60  03/25/18 138/68   Pulse Readings from Last 3 Encounters:  08/17/19 72  01/12/19 74  03/25/18 74   Wt Readings from Last 3 Encounters:  08/17/19 127 lb 9.6 oz (57.9 kg)  01/12/19 124 lb 14.4 oz (56.7 kg)  03/25/18 124 lb 12.8 oz (56.6 kg)   BMI Readings from Last 3 Encounters:  08/17/19 23.53 kg/m  01/12/19 23.03 kg/m  03/25/18 23.01 kg/m    Assessment/Interventions: Review of patient past medical history, allergies, medications, health status, including review of consultants reports, laboratory and other test data, was  performed as part of comprehensive evaluation and provision of chronic care management services.   SDOH:  (Social Determinants of Health) assessments and interventions performed: No  SDOH Screenings   Alcohol Screen: Not on file  Depression (PHQ2-9): Low Risk   . PHQ-2 Score: 0  Financial Resource Strain: Low Risk   . Difficulty of Paying Living Expenses: Not hard at all  Food Insecurity: Not on file  Housing: Not on file  Physical Activity: Not on file  Social Connections: Not on file  Stress: Not on file  Tobacco Use: Low Risk   . Smoking Tobacco Use: Never Smoker  . Smokeless Tobacco Use: Never Used  Transportation Needs: No Transportation Needs  . Lack of Transportation (Medical): No  . Lack of Transportation (Non-Medical): No    CCM Care Plan  Allergies  Allergen Reactions  . Fenofibrate     REACTION: pt states intolerant  . Gemfibrozil     REACTION: pt states intolerant  . Niacin     REACTION: pt states intolerant  . Penicillins     REACTION: Hx of arm swelling$RemoveBefore' \\T'FdlbHufKyesbn$ \ itching after shot  . Sulfamethoxazole-Trimethoprim     REACTION: "deathly sick"  w/  N\T\V  . Sulfonamide Derivatives     REACTION: nausea and vomiting    Medications Reviewed Today    Reviewed by Agnes Lawrence, CMA (Certified Medical Assistant) on 08/17/19 at 1156  Med List Status: <None>  Medication Order Taking? Sig Documenting Provider Last Dose Status Informant  acetaminophen (TYLENOL) 500 MG tablet [id number] Yes Take 500 mg by mouth every 6 (six) hours as needed. [provider] Taking Active   amLODipine (NORVASC) 5 MG tablet [id number] Yes Take 1 tablet by mouth once daily Koberlein, Junell C, MD Taking Active   aspirin 81 MG EC tablet [id number] Yes Take 81 mg by mouth daily.   [provider] Taking Active   atenolol (TENORMIN) 50 MG tablet [id number] Yes Take 1 tablet by mouth once daily Koberlein, Junell C, MD Taking Active   atorvastatin (LIPITOR) 20 MG tablet [id number]  Yes Take 1 tablet (20 mg total) by mouth daily. Caren Macadam, MD Taking Active   Bismuth Subsalicylate (PEPTO-BISMOL PO) [id number] Yes Take by mouth. [provider] Taking Active   fluticasone (FLONASE) 50 MCG/ACT nasal spray [id number] Yes USE TWO SPRAY(S) IN EACH NOSTRIL ONCE DAILY Lucretia Kern, DO Taking Active   fluticasone (FLOVENT HFA) 44 MCG/ACT inhaler [id number] Yes Inhale 2 puffs into the lungs as needed. Caren Macadam, MD Taking Active   losartan-hydrochlorothiazide [facility]) 100-12.5 MG tablet [id number] Yes Take 1 tablet by mouth once daily Koberlein, Steele Berg, MD Taking Active  Magnesium Hydroxide (MILK OF MAGNESIA PO) [id number] Yes Take by mouth. [provider] Taking Active           Patient Active Problem List   Diagnosis Date Noted  . Allergic rhinitis 09/28/2007  . Osteoarthritis 09/28/2007  . HYPERLIPIDEMIA, MIXED 10/15/2006  . Essential hypertension 10/15/2006  . GERD 10/15/2006    Immunization History  Administered Date(s) Administered  . Influenza Split 11/11/2010, 11/21/2011, 10/11/2012  . Influenza Whole 11/03/2007, 11/17/2008, 11/13/2009  . Influenza, High Dose Seasonal PF 11/07/2018  . Influenza-Unspecified 11/05/2013, 11/10/2016, 10/11/2017  . Pneumococcal Conjugate-13 12/06/2013  . Pneumococcal Polysaccharide-23 11/10/2016  . Tdap 07/01/2013    Conditions to be addressed/monitored:  Hypertension, Hyperlipidemia, GERD, Osteoarthritis and Constipation  Care Plan : [place]  Updates made by Viona Gilmore, [place] since 06/19/2020 12:00 AM    Problem: Problem: Hypertension, Hyperlipidemia, GERD, Osteoarthritis and Constipation     Long-Range Goal: Patient-Specific Goal   Start Date: 06/19/2020  Expected End Date: 06/19/2021  This Visit's Progress: On track  Priority: High  Note:   Current Barriers:  . Unable to achieve control of blood pressure and cholesterol  . Unable to self administer medications as  prescribed  Pharmacist Clinical Goal(s):  Marland Kitchen Patient will achieve adherence to monitoring guidelines and medication adherence to achieve therapeutic efficacy . achieve control of blood pressure as evidenced by home and office blood pressure readings . achieve ability to self administer medications as prescribed through use of pillbox as evidenced by patient report through collaboration with PharmD and provider.   Interventions: . 1:1 collaboration with Caren Macadam, MD regarding development and update of comprehensive plan of care as evidenced by provider attestation and co-signature . Inter-disciplinary care team collaboration (see longitudinal plan of care) . Comprehensive medication review performed; medication list updated in electronic medical record  Hypertension (BP goal <140/90) -Uncontrolled -Current treatment:  amlodipine 5mg , 1 tablet daily   atenolol 50mg , 1 tablet daily   losartan/ HCTZ 100/12.5mg , 1 tablet daily -Medications previously tried: valsartan  -Current home readings: 156/65, 169/78, 148/69, 149/71, 152/85 -Current dietary habits: did not discuss -Current exercise habits: walking some -Denies hypotensive/hypertensive symptoms -Educated on BP goals and benefits of medications for prevention of heart attack, stroke and kidney damage; Importance of home blood pressure monitoring; Proper BP monitoring technique; -Counseled to monitor BP at home weekly, document, and provide log at future appointments -Counseled on diet and exercise extensively Recommended to continue current medication  Hyperlipidemia: (LDL goal < 100) -Uncontrolled -Current treatment:  atorvastatin 20mg , 1 tablet daily  -Medications previously tried: fenofibrate, gemfibrozil, niacin -Current dietary patterns: did not discuss -Current exercise habits: walking some -Educated on Cholesterol goals;  Benefits of statin for ASCVD risk reduction; Importance of limiting foods high in  cholesterol; -Counseled on diet and exercise extensively Recommended to continue current medication  GERD (Goal: minimize symptoms) -Controlled -Current treatment   Pepto-Bismol: uses for upset stomach - not currently using  Tums as needed for heartburn -Medications previously tried: n/a  -Recommended avoiding use of Pepto-Bismol (interaction with aspirin use- both are salicylates and may increase incidence of bleeding.)   Congestion (Goal: minimize symptoms) -Controlled -Current treatment   Flovent HFA inhaler, 2 puffs as needed (patient uses as needed for severe bad congestion- last time.she used it was about 1 month ago).   fluticasone nasal spray- uses as needed -Medications previously tried: none  -Recommended to continue current medication  Osteoarthritis (Goal: minimize pain) -Controlled -Current treatment  . Tylenol  $'500mg's$  every six hours as needed  -Medications previously tried: n/a  -Recommended to continue current medication  Constipation (Goal: regular bowel movements) -Controlled -Current treatment  . milk of magnesia (patient uses every 2 to 3 weeks as needed) -Medications previously tried: none  -Recommended to continue current medication   Health Maintenance -Vaccine gaps: COVID (declines), shingrix -Current therapy:  . Aspirin 81 mg 1 tablet daily -Educated on Cost vs benefit of each product must be carefully weighed by individual consumer -Patient is satisfied with current therapy and denies issues -Counseled on risks vs benefits of aspirin therapy  Patient Goals/Self-Care Activities . Patient will:  - take medications as prescribed focus on medication adherence by using a pillbox check blood pressure weekly, document, and provide at future appointments  Follow Up Plan: Telephone follow up appointment with care management team member scheduled for: 3 months      Medication Assistance: None required.  Patient affirms current coverage meets  needs.  Patient's preferred pharmacy is:  [facility] [address] [place] [address] Phone: [phone] Fax: [phone]  Uses pill box? Yes - weekly - AM/PM Pt endorses 100% compliance - never misses a dose (daughter reports she does miss doses as she checks behind her in the pillbox)  We discussed: Benefits of medication synchronization, packaging and delivery as well as enhanced pharmacist oversight with [employer]. Patient decided to: Continue current medication management strategy  Care Plan and Follow Up Patient Decision:  Patient agrees to Care Plan and Follow-up.  Plan: Telephone follow up appointment with care management team member scheduled for:  3 months  Jeni Salles, PharmD [place] Pharmacist [place] at [place] [phone]

## 2020-06-22 ENCOUNTER — Ambulatory Visit: Payer: PPO | Admitting: Family Medicine

## 2020-07-11 ENCOUNTER — Telehealth: Payer: Self-pay | Admitting: Pharmacist

## 2020-07-11 NOTE — Progress Notes (Addendum)
Chronic Care Management Pharmacy Assistant   Name: Rhonda Weaver  MRN: [id number] DOB: 1930/04/29  Reason for Encounter: Disease State/ Hypertension Assessment Call.    Conditions to be addressed/monitored: HTN  Recent office visits:  None.   Recent consult visits:  None.   Hospital visits:  None in previous 6 months  Medications: Outpatient Encounter Medications as of 07/11/2020  Medication Sig   acetaminophen (TYLENOL) 500 MG tablet Take 500 mg by mouth every 6 (six) hours as needed.   amLODipine (NORVASC) 5 MG tablet Take 1 tablet by mouth once daily   aspirin 81 MG EC tablet Take 81 mg by mouth daily.     atenolol (TENORMIN) 50 MG tablet Take 1 tablet (50 mg total) by mouth daily.   atorvastatin (LIPITOR) 20 MG tablet Take 1 tablet by mouth once daily   Bismuth Subsalicylate (PEPTO-BISMOL PO) Take by mouth.   fluticasone (FLONASE) 50 MCG/ACT nasal spray USE TWO SPRAY(S) IN EACH NOSTRIL ONCE DAILY   fluticasone (FLOVENT HFA) 44 MCG/ACT inhaler Inhale 2 puffs into the lungs as needed.   losartan-hydrochlorothiazide (HYZAAR) 100-12.5 MG tablet Take 1 tablet by mouth once daily   Magnesium Hydroxide (MILK OF MAGNESIA PO) Take by mouth.   No facility-administered encounter medications on file as of 07/11/2020.   Reviewed chart prior to disease state call. Spoke with patient regarding BP  Recent Office Vitals: BP Readings from Last 3 Encounters:  08/17/19 140/69  01/12/19 (!) 168/60  03/25/18 138/68   Pulse Readings from Last 3 Encounters:  08/17/19 72  01/12/19 74  03/25/18 74    Wt Readings from Last 3 Encounters:  08/17/19 127 lb 9.6 oz (57.9 kg)  01/12/19 124 lb 14.4 oz (56.7 kg)  03/25/18 124 lb 12.8 oz (56.6 kg)     Kidney Function Lab Results  Component Value Date/Time   CREATININE 0.89 01/12/2019 11:13 AM   CREATININE 0.98 03/25/2018 09:56 AM   GFR 60.13 01/12/2019 11:13 AM   GFRNONAA 65 03/06/2008 08:13 AM   GFRAA 78 03/06/2008 08:13 AM    BMP  Latest Ref Rng & Units 01/12/2019 03/25/2018 11/17/2017  Glucose 70 - 99 mg/dL 108(H) 105(H) 118(H)  BUN 6 - 23 mg/dL 10 19 14   Creatinine 0.40 - 1.20 mg/dL 0.89 0.98 0.97  Sodium 135 - 145 mEq/L 140 139 137  Potassium 3.5 - 5.1 mEq/L 3.8 4.5 4.2  Chloride 96 - 112 mEq/L 102 97 97  CO2 19 - 32 mEq/L 30 35(H) 33(H)  Calcium 8.4 - 10.5 mg/dL 9.5 9.7 9.6    Current antihypertensive regimen:  amlodipine 5mg , 1 tablet daily  atenolol 50mg , 1 tablet daily  losartan/ HCTZ 100/12.5mg , 1 tablet daily  How often are you checking your Blood Pressure? 1-2x per week  Current home BP readings: 121/63 P: 58 on 07/11/20, 155/69 P:78 on 07/04/20  What recent interventions/DTPs have been made by any provider to improve Blood Pressure control since last CPP Visit: None.   Any recent hospitalizations or ED visits since last visit with CPP? No  What diet changes have been made to improve Blood Pressure Control?  Spoke with Rhonda Weaver patient daughter who states that patient tends to eat cereal for breakfast, a bologna sandwich for lunch and usually heats up a frozen stouffers meal for dinner. Rhonda Weaver stated patient loves her pepsi and coffee but does drink water quite a bit as well. Rhonda Weaver states she cooks and takes patient meals but they dont usually get ate. Patient typically  eats what she wants.   What exercise is being done to improve your Blood Pressure Control?  Rhonda Weaver stated patient walks to the mailbox and to take trash out and is able to do things around the house like dishes and laundry.   Adherence Review: Is the patient currently on ACE/ARB medication? Yes Does the patient have >5 day gap between last estimated fill dates? Yes  Notes: Spoke with patients daughter Rhonda Weaver who stated that patient is taking all her medications but she has missed some doses lately of some of her blood pressure medications. Rhonda Weaver states patients memory is not the best lately and every time they get an appointment scheduled  with PCP patient calls and cancels it. Patient needs refills on medications but sandra was not sure of which ones. She stated patient gets offensive when asked about is she taking her medications. Patient will forget to take some and prepares her own pill boxes then will not take what she forgets if its after 12 in the day.  Rhonda Weaver stated she knows patient needs to see her PCP and have some blood work done. Rhonda Weaver stated she does go and check patients blood pressure a couple times a week and she will let her do that. Numbers from recently are above. Rhonda Weaver stated that to her knowledge patient has not had any issues or side effects from her medications. Message sent to Rhonda Weaver about appointment to see PCP to get refills. Rhonda Weaver thanked me for my call.   Star Rating Drugs:  Losartan- HCTZ 100-12.5mg  - last filled 90DS on 02/05/20 at [facility] Atorvastatin 20mg  - last filled on 04/02/20 90DS at [facility] [phone]  Sent message to PCP's CMA to see if she could call and get her rescheduled to see PCP.  Rhonda Weaver, PharmD [facility] Clinical Pharmacist [place] [facility] [place]

## 2020-07-12 ENCOUNTER — Telehealth: Payer: Self-pay | Admitting: *Deleted

## 2020-07-12 NOTE — Telephone Encounter (Signed)
Spoke with the patients daughter and scheduled an appt on 7/15 to arrive at 9:15am.

## 2020-07-12 NOTE — Telephone Encounter (Signed)
-----   Message from Viona Gilmore, [facility] sent at 07/11/2020 11:14 PM EDT ----- Regarding: PCP appointment Hi,  Ms. Anna's daughter has been trying to get her to make an appointment to see Dr. Ethlyn Gallery to get labwork and to get medication refills but the patient keeps cancelling her appointments. Would you be able to reach out to see if you can get her scheduled? She is out of refills at the pharmacy and her medication compliance has been about 50% lately according to the fill report.  Thanks, Maddie

## 2020-07-15 ENCOUNTER — Other Ambulatory Visit: Payer: Self-pay | Admitting: Family Medicine

## 2020-07-15 DIAGNOSIS — I10 Essential (primary) hypertension: Secondary | ICD-10-CM

## 2020-08-24 ENCOUNTER — Telehealth: Payer: Self-pay | Admitting: Family Medicine

## 2020-08-24 ENCOUNTER — Ambulatory Visit: Payer: PPO | Admitting: Family Medicine

## 2020-08-24 NOTE — Telephone Encounter (Signed)
Pt is calling in stating that she has to work.  Pt has cancelled several appointments with our office and wanted to know if Dr. Ethlyn Gallery would be willing to continue to r/s her for appointments.  Pts daughter would like to have a call back to let her know what they should do.

## 2020-08-24 NOTE — Telephone Encounter (Signed)
Pts daughter is calling back to let us know that the pt has rec'd her medications and she told the pt that she would need to be seen in order to continue to get medications but since she has rec'd the medications she cancels her appointments.  Daughter would like to see if there is something that can be done on that scale for the pt not to get it the medication until she comes in to the office to be seen.  Daughter would like to have a call back to see what they can do and to see if they can make an appointment for the pt.

## 2020-08-27 NOTE — Telephone Encounter (Signed)
Spoke with [name], the patients daughter and informed her of the message below.  She stated the pt has refills at this time, she will check with the pt and call back for a follow up appt.

## 2020-09-14 ENCOUNTER — Telehealth: Payer: Self-pay | Admitting: Pharmacist

## 2020-09-14 NOTE — Chronic Care Management (AMB) (Signed)
    Chronic Care Management Pharmacy Assistant   Name: Rhonda Weaver  MRN: [id number] DOB: 1930/08/31  09/14/20-  Called patient to remind of appointment with Jeni Salles) on (09/17/20 at 11am via telephone.)   Patient aware of appointment date, time, and type of appointment (either telephone or in person). Patient aware to have/bring all medications, supplements, blood pressure and/or blood sugar logs to visit.  Questions: Have you had any recent office visit or specialist visit outside of [place]? No. Are there any concerns you would like to discuss during your office visit? No concerns at this time.  Are you having any problems obtaining your medications? (Whether it pharmacy issues or cost) No issues at this time.  If patient has any PAP medications ask if they are having any problems getting their PAP medication or refill? No patient assistance at this time.   Daughter [name] states to call patient on the home number [phone].  Care Gaps:  AWV - message sent to Ramond Craver to schedule. Covid- 19 vaccine - never done Zoster vaccines - never done Dexa scan - never done Influenza vaccine - overdue since 09/10/20  Notes: Spoke with katherine at [facility] who verified patients fill date for her atorvastatin as what's listed below.  Star Rating Drug:  Atorvastatin '20mg'$  - last filled on 04/02/20 90DS at [facility] Losartan-hydrochlorothiazide 100-12.'5mg'$  - last filled on 07/16/20 90DS at [facility]  Any gaps in medications fill history? Yes.     [place]  Clinical Pharmacist Assistant [phone]

## 2020-09-17 ENCOUNTER — Ambulatory Visit (INDEPENDENT_AMBULATORY_CARE_PROVIDER_SITE_OTHER): Payer: PPO | Admitting: Pharmacist

## 2020-09-17 DIAGNOSIS — E782 Mixed hyperlipidemia: Secondary | ICD-10-CM

## 2020-09-17 DIAGNOSIS — I10 Essential (primary) hypertension: Secondary | ICD-10-CM

## 2020-09-17 NOTE — Progress Notes (Signed)
Chronic Care Management Pharmacy Note  09/17/2020 Name:  Rhonda Weaver MRN:  [id number] DOB:  1931-01-07  Summary: BP not at goal < 140/90 per home readings LDL not at goal < 100  Recommendations/Changes made from today's visit: -Recommended purchasing arm cuff and bringing to next office visit  -Recommended stopping aspirin to simplify regimen and lack of clinical ASCVD   Plan: -Follow up BP assessment in 1 month  Subjective: Rhonda Weaver is an 87 y.o. year old female who is a primary patient of Rhonda Weaver.  The CCM team was consulted for assistance with disease management and care coordination needs.    Engaged with patient by telephone for follow up visit in response to provider referral for pharmacy case management and/or care coordination services.   Consent to Services:  The patient was given information about Chronic Care Management services, agreed to services, and gave verbal consent prior to initiation of services.  Please see initial visit note for detailed documentation.   Patient Care Team: Rhonda Macadam, Weaver as PCP - General (Family Medicine) Rhonda Weaver, [facility] as Pharmacist (Pharmacist)  Recent office visits: 08/17/19 Dr. Ethlyn Gallery - Patient presented via video visit for HTN follow up. Patient reported no dizziness or lightheadedness. Systolic BP a bit higher than desired but diastolic is on the lower end - no medication changes at this time to avoid hypotension. Follow up in 6 months for physical exam.  Recent consult visits: none  Hospital visits: None in previous 6 months  Objective:  Lab Results  Component Value Date   CREATININE 0.89 01/12/2019   BUN 10 01/12/2019   GFR 60.13 01/12/2019   GFRNONAA 65 03/06/2008   GFRAA 78 03/06/2008   NA 140 01/12/2019   K 3.8 01/12/2019   CALCIUM 9.5 01/12/2019   CO2 30 01/12/2019   GLUCOSE 108 (H) 01/12/2019    Lab Results  Component Value Date/Time   HGBA1C 6.1 01/12/2019 11:13  AM   HGBA1C 6.3 03/25/2018 09:56 AM   GFR 60.13 01/12/2019 11:13 AM   GFR 53.91 (L) 03/25/2018 09:56 AM    Last diabetic Eye exam: No results found for: HMDIABEYEEXA  Last diabetic Foot exam: No results found for: HMDIABFOOTEX   Lab Results  Component Value Date   CHOL 214 (H) 01/12/2019   HDL 37.90 (L) 01/12/2019   LDLCALC 110 (H) 03/09/2017   LDLDIRECT 120.0 01/12/2019   TRIG 250.0 (H) 01/12/2019   CHOLHDL 6 01/12/2019    Hepatic Function Latest Ref Rng & Units 01/12/2019 03/11/2013 12/31/2011  Total Protein 6.0 - 8.3 g/dL 6.7 6.9 7.7  Albumin 3.5 - 5.2 g/dL 4.0 3.5 3.5  AST 0 - 37 U/L $Remo'25 18 21  'GtbUl$ ALT 0 - 35 U/L $Remo'26 16 15  'qWUlD$ Alk Phosphatase 39 - 117 U/L 46 44 52  Total Bilirubin 0.2 - 1.2 mg/dL 0.7 0.8 0.7  Bilirubin, Direct 0.0 - 0.3 mg/dL - 0.0 0.1    Lab Results  Component Value Date/Time   TSH 1.13 01/12/2019 11:13 AM   TSH 2.33 03/11/2013 08:17 AM    CBC Latest Ref Rng & Units 01/12/2019 11/17/2017 03/09/2017  WBC 4.0 - 10.5 K/uL 7.1 6.3 6.1  Hemoglobin 12.0 - 15.0 g/dL 13.6 14.2 14.1  Hematocrit 36.0 - 46.0 % 40.1 42.6 41.4  Platelets 150.0 - 400.0 K/uL 235.0 234.0 246.0    Lab Results  Component Value Date/Time   VD25OH 31 12/31/2011 08:52 AM    Clinical ASCVD: No  The ASCVD Risk score Mikey Bussing DC Jr., et al., 2013) failed to calculate for the following reasons:   The 2013 ASCVD risk score is only valid for ages 59 to 97    Depression screen PHQ 2/9 08/17/2019 04/07/2017 07/29/2016  Decreased Interest 0 0 0  Down, Depressed, Hopeless 0 0 0  PHQ - 2 Score 0 0 0      Social History   Tobacco Use  Smoking Status Never  Smokeless Tobacco Never   BP Readings from Last 3 Encounters:  08/17/19 140/69  01/12/19 (!) 168/60  03/25/18 138/68   Pulse Readings from Last 3 Encounters:  08/17/19 72  01/12/19 74  03/25/18 74   Wt Readings from Last 3 Encounters:  08/17/19 127 lb 9.6 oz (57.9 kg)  01/12/19 124 lb 14.4 oz (56.7 kg)  03/25/18 124 lb 12.8 oz (56.6 kg)    BMI Readings from Last 3 Encounters:  08/17/19 23.53 kg/m  01/12/19 23.03 kg/m  03/25/18 23.01 kg/m    Assessment/Interventions: Review of patient past medical history, allergies, medications, health status, including review of consultants reports, laboratory and other test data, was performed as part of comprehensive evaluation and provision of chronic care management services.   SDOH:  (Social Determinants of Health) assessments and interventions performed: No  SDOH Screenings   Alcohol Screen: Not on file  Depression (HUD1-4): Not on file  Financial Resource Strain: Low Risk    Difficulty of Paying Living Expenses: Not hard at all  Food Insecurity: Not on file  Housing: Not on file  Physical Activity: Not on file  Social Connections: Not on file  Stress: Not on file  Tobacco Use: Not on file  Transportation Needs: No Transportation Needs   Lack of Transportation (Medical): No   Lack of Transportation (Non-Medical): No    CCM Care Plan  Allergies  Allergen Reactions   Fenofibrate     REACTION: pt states intolerant   Gemfibrozil     REACTION: pt states intolerant   Niacin     REACTION: pt states intolerant   Penicillins     REACTION: Hx of arm swelling$RemoveBefore' \\T'HZXawePNAAFIT$ \ itching after shot   Sulfamethoxazole-Trimethoprim     REACTION: "deathly sick"  w/  N\T\V   Sulfonamide Derivatives     REACTION: nausea and vomiting    Medications Reviewed Today     Reviewed by Rhonda Weaver, CMA (Certified Medical Assistant) on 08/17/19 at 1156  Med List Status: <None>   Medication Order Taking? Sig Documenting Provider Last Dose Status Informant  acetaminophen (TYLENOL) 500 MG tablet [id number] Yes Take 500 mg by mouth every 6 (six) hours as needed. Provider, Historical, Weaver Taking Active   amLODipine (NORVASC) 5 MG tablet [id number] Yes Take 1 tablet by mouth once daily Rhonda Weaver Taking Active   aspirin 81 MG EC tablet [id number] Yes Take 81 mg by mouth daily.    Provider, Historical, Weaver Taking Active   atenolol (TENORMIN) 50 MG tablet [id number] Yes Take 1 tablet by mouth once daily Rhonda Weaver Taking Active   atorvastatin (LIPITOR) 20 MG tablet [id number] Yes Take 1 tablet (20 mg total) by mouth daily. Rhonda Macadam, Weaver Taking Active   Bismuth Subsalicylate (PEPTO-BISMOL PO) [id number] Yes Take by mouth. Provider, Historical, Weaver Taking Active   fluticasone (FLONASE) 50 MCG/ACT nasal spray [id number] Yes USE TWO SPRAY(S) IN EACH NOSTRIL ONCE DAILY Lucretia Kern, DO Taking Active   fluticasone (FLOVENT HFA) 44 MCG/ACT inhaler [id number] Yes  Inhale 2 puffs into the lungs as needed. Rhonda Macadam, Weaver Taking Active   losartan-hydrochlorothiazide [facility]) 100-12.5 MG tablet [id number] Yes Take 1 tablet by mouth once daily Rhonda Weaver Taking Active   Magnesium Hydroxide (MILK OF MAGNESIA PO) [id number] Yes Take by mouth. Provider, Historical, Weaver Taking Active             Patient Active Problem List   Diagnosis Date Noted   Allergic rhinitis 09/28/2007   Osteoarthritis 09/28/2007   HYPERLIPIDEMIA, MIXED 10/15/2006   Essential hypertension 10/15/2006   GERD 10/15/2006    Immunization History  Administered Date(s) Administered   Influenza Split 11/11/2010, 11/21/2011, 10/11/2012   Influenza Whole 11/03/2007, 11/17/2008, 11/13/2009   Influenza, High Dose Seasonal PF 11/07/2018   Influenza-Unspecified 11/05/2013, 11/10/2016, 10/11/2017   Pneumococcal Conjugate-13 12/06/2013   Pneumococcal Polysaccharide-23 11/10/2016   Tdap 07/01/2013    Conditions to be addressed/monitored:  Hypertension, Hyperlipidemia, GERD, Osteoarthritis and Constipation  Conditions addressed this visit: Hypertension, hyperlipidemia  Care Plan : [place]  Updates made by Rhonda Weaver, [place] since 09/17/2020 12:00 AM     Problem: Problem: Hypertension, Hyperlipidemia, GERD, Osteoarthritis and Constipation      Long-Range  Goal: Patient-Specific Goal   Start Date: 06/19/2020  Expected End Date: 06/19/2021  Recent Progress: On track  Priority: High  Note:   Current Barriers:  Unable to achieve control of blood pressure and cholesterol  Unable to self administer medications as prescribed  Pharmacist Clinical Goal(s):  Patient will achieve adherence to monitoring guidelines and medication adherence to achieve therapeutic efficacy achieve control of blood pressure as evidenced by home and office blood pressure readings achieve ability to self administer medications as prescribed through use of pillbox as evidenced by patient report through collaboration with PharmD and provider.   Interventions: 1:1 collaboration with Rhonda Macadam, Weaver regarding development and update of comprehensive plan of care as evidenced by provider attestation and co-signature Inter-disciplinary care team collaboration (see longitudinal plan of care) Comprehensive medication review performed; medication list updated in electronic medical record  Hypertension (BP goal <140/90) -Uncontrolled -Current treatment: amlodipine 5mg , 1 tablet daily  atenolol 50mg , 1 tablet daily  losartan/ HCTZ 100/12.5mg , 1 tablet daily -Medications previously tried: valsartan  -Current home readings: 178/82, 168/69, 139/53 -Current dietary habits: did not discuss -Current exercise habits: walking some -Denies hypotensive/hypertensive symptoms -Educated on BP goals and benefits of medications for prevention of heart attack, stroke and kidney damage; Importance of home blood pressure monitoring; Proper BP monitoring technique; -Counseled to monitor BP at home twice weekly, document, and provide log at future appointments -Counseled on diet and exercise extensively Recommended to continue current medication Recommended bringing BP cuff to next in office appointment.  Hyperlipidemia: (LDL goal < 100) -Uncontrolled -Current treatment: atorvastatin  20mg , 1 tablet daily  -Medications previously tried: fenofibrate, gemfibrozil, niacin -Current dietary patterns: did not discuss -Current exercise habits: walking some -Educated on Cholesterol goals;  Benefits of statin for ASCVD risk reduction; Importance of limiting foods high in cholesterol; -Counseled on diet and exercise extensively Recommended to continue current medication  GERD (Goal: minimize symptoms) -Controlled -Current treatment  Pepto-Bismol: uses for upset stomach - not currently using Tums as needed for heartburn -Medications previously tried: n/a  -Recommended avoiding use of Pepto-Bismol (interaction with aspirin use- both are salicylates and may increase incidence of bleeding.)   Congestion (Goal: minimize symptoms) -Controlled -Current treatment  Flovent HFA inhaler, 2 puffs as needed (patient uses as needed for  severe bad congestion- last time.she used it was about 1 month ago).  fluticasone nasal spray- uses as needed -Medications previously tried: none  -Recommended to continue current medication  Osteoarthritis (Goal: minimize pain) -Controlled -Current treatment  Tylenol 500mg  every six hours as needed  -Medications previously tried: n/a  -Recommended to continue current medication  Constipation (Goal: regular bowel movements) -Controlled -Current treatment  milk of magnesia (patient uses every 2 to 3 weeks as needed) -Medications previously tried: none  -Recommended to continue current medication   Health Maintenance -Vaccine gaps: COVID (declines), shingrix -Current therapy:  Aspirin 81 mg 1 tablet daily -Educated on Cost vs benefit of each product must be carefully weighed by individual consumer -Patient is satisfied with current therapy and denies issues -Counseled on risks vs benefits of aspirin therapy  Patient Goals/Self-Care Activities Patient will:  - take medications as prescribed focus on medication adherence by using a  pillbox check blood pressure weekly, document, and provide at future appointments  Follow Up Plan: Telephone follow up appointment with care management team member scheduled for: 4 months         Medication Assistance: None required.  Patient affirms current coverage meets needs.  Compliance/Adherence/Medication fill history: Care Gaps: Shingrix, colonoscopy, COVID booster  Star-Rating Drugs: Atorvastatin 20mg  - last filled on 04/02/20 90DS at [facility] 100-12.5mg  - last filled on 07/16/20 90DS at [facility]  Patient's preferred pharmacy is:  [place], [address] [place] [address] Phone: [phone] Fax: [phone]  Uses pill box? Yes - weekly - AM/PM Pt endorses 100% compliance - never misses a dose (daughter reports she does miss doses as she checks behind her in the pillbox)  We discussed: Benefits of medication synchronization, packaging and delivery as well as enhanced pharmacist oversight with [facility]. Patient decided to: Continue current medication management strategy  Care Plan and Follow Up Patient Decision:  Patient agrees to Care Plan and Follow-up.  Plan: Telephone follow up appointment with care management team member scheduled for:  4 months  Jeni Salles, PharmD [place] Pharmacist [place] at [place] [phone]

## 2020-10-05 ENCOUNTER — Ambulatory Visit: Payer: PPO | Admitting: Family Medicine

## 2020-11-12 ENCOUNTER — Telehealth: Payer: Self-pay | Admitting: Pharmacist

## 2020-11-12 NOTE — Chronic Care Management (AMB) (Signed)
Chronic Care Management Pharmacy Assistant   Name: Rhonda Weaver  MRN: [id number] DOB: 1930/06/09  Reason for Encounter: Disease State/ Hypertension Assessment Call.    Conditions to be addressed/monitored: HTN  Recent office visits:  None.  Recent consult visits:  None.  Hospital visits:  None in previous 6 months  Medications: Outpatient Encounter Medications as of 11/12/2020  Medication Sig   acetaminophen (TYLENOL) 500 MG tablet Take 500 mg by mouth every 6 (six) hours as needed.   amLODipine (NORVASC) 5 MG tablet Take 1 tablet (5 mg total) by mouth daily.   atenolol (TENORMIN) 50 MG tablet Take 1 tablet (50 mg total) by mouth daily.   atorvastatin (LIPITOR) 20 MG tablet Take 1 tablet by mouth once daily   Bismuth Subsalicylate (PEPTO-BISMOL PO) Take by mouth.   fluticasone (FLONASE) 50 MCG/ACT nasal spray USE TWO SPRAY(S) IN EACH NOSTRIL ONCE DAILY   fluticasone (FLOVENT HFA) 44 MCG/ACT inhaler Inhale 2 puffs into the lungs as needed.   losartan-hydrochlorothiazide (HYZAAR) 100-12.5 MG tablet Take 1 tablet by mouth once daily   Magnesium Hydroxide (MILK OF MAGNESIA PO) Take by mouth.   No facility-administered encounter medications on file as of 11/12/2020.   Fill History: ATENOLOL 50MG        TAB 02/14/2020 90   ATORVASTATIN 20MG    TAB 04/02/2020 90   FLOVENT HFA 44MCG/ACT AEROSOL 02/13/2019 30   FLUTICASONE PROPIONATE 50MCG/ACT SUSPENSION 05/05/2018 30   LOSARTAN POTASSIUM 100MG  TABLET 04/23/2018 90   LOSARTAN/HCT 100-12.5MG  TAB 07/16/2020 90   AMLODIPINE 5MG  TAB 10/10/2020 90   HYDROCHLOROTHIAZIDE 12.5MG  TABLET 07/18/2018 90   Reviewed chart prior to disease state call. Spoke with patient regarding BP  Recent Office Vitals: BP Readings from Last 3 Encounters:  08/17/19 140/69  01/12/19 (!) 168/60  03/25/18 138/68   Pulse Readings from Last 3 Encounters:  08/17/19 72  01/12/19 74  03/25/18 74    Wt Readings from Last 3 Encounters:  08/17/19  127 lb 9.6 oz (57.9 kg)  01/12/19 124 lb 14.4 oz (56.7 kg)  03/25/18 124 lb 12.8 oz (56.6 kg)     Kidney Function Lab Results  Component Value Date/Time   CREATININE 0.89 01/12/2019 11:13 AM   CREATININE 0.98 03/25/2018 09:56 AM   GFR 60.13 01/12/2019 11:13 AM   GFRNONAA 65 03/06/2008 08:13 AM   GFRAA 78 03/06/2008 08:13 AM    BMP Latest Ref Rng & Units 01/12/2019 03/25/2018 11/17/2017  Glucose 70 - 99 mg/dL 108(H) 105(H) 118(H)  BUN 6 - 23 mg/dL 10 19 14   Creatinine 0.40 - 1.20 mg/dL 0.89 0.98 0.97  Sodium 135 - 145 mEq/L 140 139 137  Potassium 3.5 - 5.1 mEq/L 3.8 4.5 4.2  Chloride 96 - 112 mEq/L 102 97 97  CO2 19 - 32 mEq/L 30 35(H) 33(H)  Calcium 8.4 - 10.5 mg/dL 9.5 9.7 9.6    Current antihypertensive regimen:  Amlodipine 5mg  - take 1 tablet by mouth daily.  Atenolol 50mg  - take 1 tablet by mouth daily.  Losartan-hydrochlorothiazide 100-12.5mg  - take 1 tablet by mouth once daily.  How often are you checking your Blood Pressure? 1-2x per week Current home BP readings: 10/22/20 was 162/81, 10/24/20 was 121/63 P: 58, 10/26/20 was 139/69 P:63, 11/01/20 170/81 P:72.  What recent interventions/DTPs have been made by any provider to improve Blood Pressure control since last CPP Visit: None. Any recent hospitalizations or ED visits since last visit with CPP? No  Adherence Review: Is the patient currently  on ACE/ARB medication? Yes Does the patient have >5 day gap between last estimated fill dates? No  Notes: Spoke with Rhonda Weaver at [facility] who confirmed the fill dates for medications below. Patient is overdue on both star rated medications.  Spoke with patients daughter Rhonda Weaver who states that patient is having memory trouble and forgets to take her medicine. Rhonda Weaver states that she goes to her moms house an checks her blood pressure twice a week and keeps a log. Rhonda Weaver states that she does patients pill separator but patient does not remember to take medications. Rhonda Weaver stated that  patient is suppose to see PCP this month. Patient eats what she wants mostly but she does not eat a lot of meat . Patient however does not limit salt intake. Patient complains of being cold all the time. Patient is able to perform all ADL'S on her own. Patient tends to have coffee, cereal and an ensure for breakfast. She likes fried bologna sandwiches for lunch sometimes. Patient gets different things for dinner like spaghetti or chicken pie that sandra her daughter cooks and drops off to her. I discussed with patient that maybe it would be best to go daily and make sure patient takes her medications. Rhonda Weaver was agreeable. I also encouraged discussion with PCP about memory issues. Patient no longer drives. Rhonda Weaver thanked me for my call.    Care Gaps:  AWV - message sent to Ramond Craver CMA to schedule. Covid-19 vaccine - never done Zoster vaccines - never done Dexa scan - never done Flu vaccine - due  Star Rating Drugs:  Atorvastatin 20mg  - last filled on 04/02/20 90DS at [facility] losartan-hydrochlorothiazide 100-12.5mg  - last filled on 07/16/20 90DS at [facility] [phone]

## 2020-11-30 ENCOUNTER — Ambulatory Visit: Payer: PPO | Admitting: Family Medicine

## 2020-12-26 ENCOUNTER — Ambulatory Visit: Payer: PPO | Admitting: Family Medicine

## 2021-01-12 ENCOUNTER — Other Ambulatory Visit: Payer: Self-pay

## 2021-01-12 ENCOUNTER — Emergency Department (HOSPITAL_COMMUNITY)
Admission: EM | Admit: 2021-01-12 | Discharge: 2021-01-12 | Disposition: A | Discharge status: Home / Self Care | Payer: PPO | Attending: Emergency Medicine | Admitting: Emergency Medicine

## 2021-01-12 ENCOUNTER — Emergency Department (HOSPITAL_COMMUNITY): Payer: PPO

## 2021-01-12 ENCOUNTER — Encounter (HOSPITAL_COMMUNITY): Payer: Self-pay | Admitting: Emergency Medicine

## 2021-01-12 DIAGNOSIS — M4126 Other idiopathic scoliosis, lumbar region: Secondary | ICD-10-CM | POA: Diagnosis not present

## 2021-01-12 DIAGNOSIS — Z79899 Other long term (current) drug therapy: Secondary | ICD-10-CM | POA: Insufficient documentation

## 2021-01-12 DIAGNOSIS — G9389 Other specified disorders of brain: Secondary | ICD-10-CM | POA: Diagnosis not present

## 2021-01-12 DIAGNOSIS — I739 Peripheral vascular disease, unspecified: Secondary | ICD-10-CM | POA: Diagnosis not present

## 2021-01-12 DIAGNOSIS — W19XXXA Unspecified fall, initial encounter: Secondary | ICD-10-CM | POA: Diagnosis not present

## 2021-01-12 DIAGNOSIS — I10 Essential (primary) hypertension: Secondary | ICD-10-CM | POA: Diagnosis not present

## 2021-01-12 DIAGNOSIS — Z043 Encounter for examination and observation following other accident: Secondary | ICD-10-CM | POA: Diagnosis not present

## 2021-01-12 DIAGNOSIS — M545 Low back pain, unspecified: Secondary | ICD-10-CM | POA: Diagnosis not present

## 2021-01-12 DIAGNOSIS — M25551 Pain in right hip: Secondary | ICD-10-CM | POA: Diagnosis not present

## 2021-01-12 DIAGNOSIS — S20222A Contusion of left back wall of thorax, initial encounter: Secondary | ICD-10-CM | POA: Diagnosis not present

## 2021-01-12 DIAGNOSIS — F0390 Unspecified dementia without behavioral disturbance: Secondary | ICD-10-CM | POA: Diagnosis not present

## 2021-01-12 DIAGNOSIS — S300XXA Contusion of lower back and pelvis, initial encounter: Secondary | ICD-10-CM | POA: Insufficient documentation

## 2021-01-12 DIAGNOSIS — R9431 Abnormal electrocardiogram [ECG] [EKG]: Secondary | ICD-10-CM | POA: Diagnosis not present

## 2021-01-12 DIAGNOSIS — M25552 Pain in left hip: Secondary | ICD-10-CM | POA: Insufficient documentation

## 2021-01-12 DIAGNOSIS — M2578 Osteophyte, vertebrae: Secondary | ICD-10-CM | POA: Diagnosis not present

## 2021-01-12 DIAGNOSIS — M50322 Other cervical disc degeneration at C5-C6 level: Secondary | ICD-10-CM | POA: Diagnosis not present

## 2021-01-12 DIAGNOSIS — R29818 Other symptoms and signs involving the nervous system: Secondary | ICD-10-CM | POA: Diagnosis not present

## 2021-01-12 DIAGNOSIS — G238 Other specified degenerative diseases of basal ganglia: Secondary | ICD-10-CM | POA: Diagnosis not present

## 2021-01-12 DIAGNOSIS — S0990XA Unspecified injury of head, initial encounter: Secondary | ICD-10-CM | POA: Diagnosis not present

## 2021-01-12 DIAGNOSIS — S3992XA Unspecified injury of lower back, initial encounter: Secondary | ICD-10-CM | POA: Diagnosis present

## 2021-01-12 DIAGNOSIS — R42 Dizziness and giddiness: Secondary | ICD-10-CM | POA: Diagnosis not present

## 2021-01-12 DIAGNOSIS — F03B0 Unspecified dementia, moderate, without behavioral disturbance, psychotic disturbance, mood disturbance, and anxiety: Secondary | ICD-10-CM | POA: Insufficient documentation

## 2021-01-12 DIAGNOSIS — I700 Atherosclerosis of aorta: Secondary | ICD-10-CM | POA: Diagnosis not present

## 2021-01-12 LAB — CBC WITH DIFFERENTIAL/PLATELET
Abs Immature Granulocytes: 0.01 10*3/uL (ref 0.00–0.07)
Basophils Absolute: 0 10*3/uL (ref 0.0–0.1)
Basophils Relative: 0 %
Eosinophils Absolute: 0 10*3/uL (ref 0.0–0.5)
Eosinophils Relative: 0 %
HCT: 41.5 % (ref 36.0–46.0)
Hemoglobin: 14.1 g/dL (ref 12.0–15.0)
Immature Granulocytes: 0 %
Lymphocytes Relative: 33 %
Lymphs Abs: 2.3 10*3/uL (ref 0.7–4.0)
MCH: 33.2 pg (ref 26.0–34.0)
MCHC: 34 g/dL (ref 30.0–36.0)
MCV: 97.6 fL (ref 80.0–100.0)
Monocytes Absolute: 0.7 10*3/uL (ref 0.1–1.0)
Monocytes Relative: 11 %
Neutro Abs: 3.9 10*3/uL (ref 1.7–7.7)
Neutrophils Relative %: 56 %
Platelets: 251 10*3/uL (ref 150–400)
RBC: 4.25 MIL/uL (ref 3.87–5.11)
RDW: 13.1 % (ref 11.5–15.5)
WBC: 7 10*3/uL (ref 4.0–10.5)
nRBC: 0 % (ref 0.0–0.2)

## 2021-01-12 LAB — BASIC METABOLIC PANEL
Anion gap: 9 (ref 5–15)
BUN: 12 mg/dL (ref 8–23)
CO2: 26 mmol/L (ref 22–32)
Calcium: 9.4 mg/dL (ref 8.9–10.3)
Chloride: 105 mmol/L (ref 98–111)
Creatinine, Ser: 1.02 mg/dL — ABNORMAL HIGH (ref 0.44–1.00)
GFR, Estimated: 53 mL/min — ABNORMAL LOW (ref >60)
Glucose, Bld: 91 mg/dL (ref 70–99)
Potassium: 3.6 mmol/L (ref 3.5–5.1)
Sodium: 140 mmol/L (ref 135–145)

## 2021-01-12 MED ORDER — METOPROLOL TARTRATE 5 MG/5ML IV SOLN
5.0000 mg | Freq: Once | INTRAVENOUS | Status: AC
  Administered 2021-01-12: 5 mg via INTRAVENOUS
  Filled 2021-01-12: qty 5

## 2021-01-12 MED ORDER — MECLIZINE HCL 25 MG PO TABS: 25.0000 mg | ORAL_TABLET | Freq: Three times a day (TID) | ORAL | 0 refills | Status: DC | PRN

## 2021-01-12 MED ORDER — DIAZEPAM 5 MG/ML IJ SOLN
2.0000 mg | Freq: Once | INTRAMUSCULAR | Status: AC
  Administered 2021-01-12: 2 mg via INTRAVENOUS
  Filled 2021-01-12: qty 2

## 2021-01-12 NOTE — Discharge Instructions (Addendum)
1.  Try meclizine every 8 hours if needed for vertigo.  Follow-up with your doctor soon as possible. 2.  Take Tylenol for body aches.  You may use cold packs on your areas of bruising.  Return if you develop shortness of breath, chest pain or other concerning symptoms.

## 2021-01-12 NOTE — ED Provider Notes (Signed)
[facility] EMERGENCY DEPARTMENT Provider Note   CSN: [id number] Arrival date & time: 01/12/21  1135     History Chief Complaint  Patient presents with   Rhonda Weaver is a 87 y.o. female.  HPI Patient was at home and fell 2 days ago.  Patient daughter reports that she was on the phone with the patient.  The patient went to check on her thermostat and never came back to the phone.  The patient daughter immediately sent her husband over who found the patient on the floor by her back door.  She had fallen and was not able to get back up.  They helped her up and she did not complain of much pain.  They brought her back to their house for observation and assistance.  Over the next day she started complaining of pain in her lower back and hips.  She continues to ambulate but is requiring some assistance.  At baseline patient ambulates independently without walker or cane.  Patient is denying any pain at the moment.  Patient daughter reports that they have been giving her Tylenol and she seemed to have quite a bit of pain but now it seems resolved now that she is in the emergency department.    Past Medical History:  Diagnosis Date   Allergy    Cataract    Chronic bronchitis ([place]) 10/15/2006   Qualifier: Diagnosis of  By: Lenna Gilford MD, Deborra Medina    COLONIC POLYPS, ADENOMATOUS 10/15/2006   Qualifier: Diagnosis of  By: Lenna Gilford MD, Deborra Medina    DEGENERATIVE JOINT DISEASE 09/28/2007   Qualifier: Diagnosis of  By: Lenna Gilford MD, Deborra Medina    DIVERTICULOSIS OF COLON 09/28/2007   Qualifier: Diagnosis of  By: Lenna Gilford MD, Deborra Medina    GERD (gastroesophageal reflux disease)    HEMORRHOIDS, INTERNAL 10/15/2006   Qualifier: Diagnosis of  By: Lenna Gilford MD, Deborra Medina    Hyperlipidemia    Hypertension    Irritable bowel syndrome 10/15/2006   Qualifier: Diagnosis of  By: Lenna Gilford MD, Deborra Medina     Patient Active Problem List   Diagnosis Date Noted   Allergic rhinitis 09/28/2007   Osteoarthritis 09/28/2007    HYPERLIPIDEMIA, MIXED 10/15/2006   Essential hypertension 10/15/2006   GERD 10/15/2006    Past Surgical History:  Procedure Laterality Date   COLONOSCOPY     DENTAL SURGERY     POLYPECTOMY     VAGINAL HYSTERECTOMY       OB History   No obstetric history on file.     Family History  Problem Relation Age of Onset   Heart disease Mother    Kidney disease Father     Social History   Tobacco Use   Smoking status: Never   Smokeless tobacco: Never  Substance Use Topics   Alcohol use: No   Drug use: No    Home Medications Prior to Admission medications   Medication Sig Start Date End Date Taking? Authorizing Provider  meclizine (ANTIVERT) 25 MG tablet Take 1 tablet (25 mg total) by mouth 3 (three) times daily as needed for dizziness. 01/12/21  Yes Charlesetta Shanks, MD  acetaminophen (TYLENOL) 500 MG tablet Take 500 mg by mouth every 6 (six) hours as needed.    [provider]  amLODipine (NORVASC) 5 MG tablet Take 1 tablet (5 mg total) by mouth daily. 07/16/20   Caren Macadam, MD  atenolol (TENORMIN) 50 MG tablet Take 1 tablet (50  mg total) by mouth daily. 02/14/20   Caren Macadam, MD  atorvastatin (LIPITOR) 20 MG tablet Take 1 tablet by mouth once daily 04/02/20   Koberlein, Andris Flurry C, MD  Bismuth Subsalicylate (PEPTO-BISMOL PO) Take by mouth.    [provider]  fluticasone (FLONASE) 50 MCG/ACT nasal spray USE TWO SPRAY(S) IN EACH NOSTRIL ONCE DAILY 07/21/17   Lucretia Kern, DO  fluticasone (FLOVENT HFA) 44 MCG/ACT inhaler Inhale 2 puffs into the lungs as needed. 01/12/19   Caren Macadam, MD  losartan-hydrochlorothiazide (HYZAAR) 100-12.5 MG tablet Take 1 tablet by mouth once daily 07/16/20   Koberlein, Andris Flurry C, MD  Magnesium Hydroxide (MILK OF MAGNESIA PO) Take by mouth.    [provider]    Allergies    Fenofibrate, Gemfibrozil, Niacin, Penicillins, Sulfamethoxazole-trimethoprim, and Sulfonamide derivatives  Review of Systems    Review of Systems 10 systems reviewed and negative except as per HPI Physical Exam Updated Vital Signs BP (!) 174/94   Pulse (!) 106   Temp 98.2 F (36.8 C) (Oral)   Resp 17   Ht 5\' 1"  (1.549 m)   Wt 63.5 kg   SpO2 98%   BMI 26.45 kg/m   Physical Exam Constitutional:      Comments: Alert nontoxic.  Interactive.  HENT:     Head: Normocephalic and atraumatic.     Mouth/Throat:     Pharynx: Oropharynx is clear.  Eyes:     Extraocular Movements: Extraocular movements intact.     Conjunctiva/sclera: Conjunctivae normal.  Cardiovascular:     Rate and Rhythm: Normal rate and regular rhythm.  Pulmonary:     Effort: Pulmonary effort is normal.     Breath sounds: Normal breath sounds.     Comments: Ecchymotic bruising to the left posterior chest wall slightly medial to the scapula.  Also hematoma with some bruising over the left flank and paraspinous area.  Slightly tender but does not seem to have significant distress with palpation. Abdominal:     General: There is no distension.     Palpations: Abdomen is soft.     Tenderness: There is no abdominal tenderness.  Musculoskeletal:        General: No swelling, tenderness, deformity or signs of injury. Normal range of motion.     Cervical back: Neck supple.     Right lower leg: No edema.     Left lower leg: No edema.     Comments: No apparent extremity injuries.  Patient is using both upper extremities symmetrically.  She uses them to pull herself up by the handrails.  She is using them to rearrange sheets with no sign of discomfort or issue with range of motion.  Patient is spontaneously flexing extending both legs exhibiting good range of motion.  Patient can deeply flex both legs at the hips knees and ankles and push against resistance without difficulty  Skin:    General: Skin is warm and dry.  Neurological:     Comments: Patient is alert.  She is interactive.  She appears to have significant memory and cognitive deficit  consistent with dementia.  She does not recall any details of her history and defers to her daughter for answers.  She does assist in following commands.  No focal motor deficits.    ED Results / Procedures / Treatments   Labs (all labs ordered are listed, but only abnormal results are displayed) Labs Reviewed  BASIC METABOLIC PANEL - Abnormal; Notable for the following components:  Result Value   Creatinine, Ser 1.02 (*)    GFR, Estimated 53 (*)    All other components within normal limits  CBC WITH DIFFERENTIAL/PLATELET    EKG EKG Interpretation  Date/Time:  Saturday January 12 2021 12:40:47 EST Ventricular Rate:  88 PR Interval:  112 QRS Duration: 82 QT Interval:  380 QTC Calculation: 459 R Axis:   44 Text Interpretation: Sinus rhythm with Premature atrial complexes Septal infarct , age undetermined Abnormal ECG no acute ischemic appearance. no old comparison available Confirmed by Charlesetta Shanks [phone]) on 01/12/2021 3:26:42 PM  Radiology DG Chest 2 View  Result Date: 01/12/2021 CLINICAL DATA:  Status post fall. EXAM: CHEST - 2 VIEW COMPARISON:  March 21, 2013 FINDINGS: Tortuosity and calcific atherosclerotic disease of the aorta. Cardiomediastinal silhouette is normal. Mediastinal contours appear intact. There is no evidence of focal airspace consolidation, pleural effusion or pneumothorax. Osseous structures are without acute abnormality. Soft tissues are grossly normal. IMPRESSION: No active cardiopulmonary disease. Electronically Signed   By: Fidela Salisbury M.D.   On: 01/12/2021 16:37   DG Lumbar Spine 2-3 Views  Result Date: 01/12/2021 CLINICAL DATA:  Fall, back pain and bruising EXAM: LUMBAR SPINE - 2-3 VIEW COMPARISON:  None. FINDINGS: No fracture or dislocation of the lumbar spine. Gentle levoscoliosis of the lumbar spine, apex L4. Mild to moderate disc space height loss and osteophytosis, worst at the lower lumbar levels. Mild multilevel facet degenerative  change. Nonobstructive pattern of overlying bowel gas. IMPRESSION: 1.  No fracture or dislocation of the lumbar spine. 2. Mild to moderate disc space height loss and osteophytosis, worst at the lower lumbar levels. Mild multilevel facet degenerative change. Electronically Signed   By: Delanna Ahmadi M.D.   On: 01/12/2021 16:45   CT Head Wo Contrast  Result Date: 01/12/2021 CLINICAL DATA:  Trauma, fall EXAM: CT HEAD WITHOUT CONTRAST TECHNIQUE: Contiguous axial images were obtained from the base of the skull through the vertex without intravenous contrast. COMPARISON:  None. FINDINGS: Brain: There are no signs of recent bleeding. There is no shift of midline structures. Cortical sulci are prominent. There is decreased density in the periventricular white matter. Minimal calcifications are seen in the basal ganglia. Vascular: There are scattered arterial calcifications. Skull: Unremarkable. Sinuses/Orbits: Unremarkable. Other: None IMPRESSION: No acute intracranial findings are seen in noncontrast CT brain. Atrophy. Small-vessel disease. Electronically Signed   By: Elmer Picker M.D.   On: 01/12/2021 14:41   CT Cervical Spine Wo Contrast  Result Date: 01/12/2021 CLINICAL DATA:  Status post fall on 01/10/2021 EXAM: CT CERVICAL SPINE WITHOUT CONTRAST TECHNIQUE: Multidetector CT imaging of the cervical spine was performed without intravenous contrast. Multiplanar CT image reconstructions were also generated. COMPARISON:  None. FINDINGS: Alignment: Straightening of the normal cervical lordosis. Very mild (2-3 mm) anterolisthesis of C3 on C4 and C4 on C5. No acute appearing subluxation. Skull base and vertebrae: No acute fracture or malalignment. Soft tissues and spinal canal: No prevertebral fluid or swelling. No visible canal hematoma. Disc levels: Multilevel degenerative changes most significant at C5-C6 were there is significant loss of disc space height and posterior disc osteophyte formation. Facet  arthropathy is present at multiple levels on the left worse than the right. There is ankylosis of the left facets at C4-C5. Upper chest: Large left inferior thyroid nodule measuring up to 3.6 cm. Nodular biapical pleuroparenchymal scarring with areas of internal calcification. Additional subpleural nodular opacity along the posterolateral aspect of the right upper lobe measures approximately 0.8 x  0.8 cm. Pleural based pleuroparenchymal scarring on the left measures at least 1.6 x 1.2 cm. Other: None. IMPRESSION: 1. No evidence of acute fracture or malalignment. 2. Straightening of the normal cervical lordosis may be positional, degenerative and/or related to underlying muscle spasm. 3. Relatively focal degenerative disc disease at C5-C6 as well as multilevel left-sided facet arthropathy most significant at C4-C5. 4. Mild degenerative anterolisthesis of C3 on C4 and C4 on C5. 5. Large left inferior thyroid nodule measuring up to 3.6 cm. Recommend dedicated thyroid ultrasound if not previously evaluated. 6. Mass like nodular opacities in both upper lungs with internal dystrophic calcifications favored to represent pleuroparenchymal scarring or masslike pulmonary fibrosis. However, the findings are incompletely evaluated. Consider non emergent dedicated CT scan of the chest for complete evaluation. Electronically Signed   By: Jacqulynn Cadet M.D.   On: 01/12/2021 14:49   MR BRAIN WO CONTRAST  Result Date: 01/12/2021 CLINICAL DATA:  Acute neurologic deficit EXAM: MRI HEAD WITHOUT CONTRAST TECHNIQUE: Multiplanar, multiecho pulse sequences of the brain and surrounding structures were obtained without intravenous contrast. COMPARISON:  None. FINDINGS: Brain: No acute infarct, mass effect or extra-axial collection. No acute or chronic hemorrhage. There is multifocal hyperintense T2-weighted signal within the white matter. Generalized volume loss without a clear lobar predilection. The midline structures are normal.  Vascular: Major flow voids are preserved. Skull and upper cervical spine: Normal calvarium and skull base. Visualized upper cervical spine and soft tissues are normal. Sinuses/Orbits:No paranasal sinus fluid levels or advanced mucosal thickening. No mastoid or middle ear effusion. Normal orbits. IMPRESSION: 1. No acute intracranial abnormality. 2. Findings of chronic small vessel ischemia and generalized volume loss. Electronically Signed   By: Ulyses Jarred M.D.   On: 01/12/2021 20:48   DG Hip Unilat W or Wo Pelvis 2-3 Views Left  Result Date: 01/12/2021 CLINICAL DATA:  Left hip pain post fall. EXAM: DG HIP (WITH OR WITHOUT PELVIS) 2-3V LEFT COMPARISON:  None. FINDINGS: There is no evidence of hip fracture or dislocation. Heterotopic ossification about the greater trochanter. Large amount of formed stool throughout the colon. IMPRESSION: 1. No acute fracture or dislocation identified about the left hip. 2. Large amount of formed stool throughout the colon. Electronically Signed   By: Fidela Salisbury M.D.   On: 01/12/2021 13:37    Procedures Procedures   Medications Ordered in ED Medications  diazepam (VALIUM) injection 2 mg (2 mg Intravenous Given 01/12/21 1831)  metoprolol tartrate (LOPRESSOR) injection 5 mg (5 mg Intravenous Given 01/12/21 2222)    ED Course  I have reviewed the triage vital signs and the nursing notes.  Pertinent labs & imaging results that were available during my care of the patient were reviewed by me and considered in my medical decision making (see chart for details).    MDM Rules/Calculators/A&P                           Patient presents as outlined with a fall that was unwitnessed.  He was then assisted by family members and seemed to be all right at the time of initial fall and injury.  However since that time she has started to have increased amounts of pain in her back and limitations to mobility.  This was one of the main concerns and coming to the emergency  department today.  Patient had a fairly significant hematoma and abrasion at the left thoracic and flank area.  X-rays however did not  show any rib fracture, lab work is stable without signs of blood loss.  Clinically she is relatively well in appearance.  She is not having significant pain or distress at time of evaluation.  At this time it appears that patient has soft tissue injury with contusions but we also discussed possibility of occult rib fracture and pain control.  Patient's daughter is also concerned for dizziness symptoms.  Patient does describe a prior history of vertigo.  Patient started feel that the dizziness was impacting the patient's gait.  With an unwitnessed fall versus stroke, MRI was obtained without any finding of acute stroke.  At this time I suspect this is true vertigo which patient is have previously.  Will opt to treat for vertigo and symptomatic control for painful thoracic back contusions. Final Clinical Impression(s) / ED Diagnoses Final diagnoses:  Fall, initial encounter  Vertigo  Contusion of left side of back, initial encounter  Moderate dementia without behavioral disturbance, psychotic disturbance, mood disturbance, or anxiety, unspecified dementia type    Rx / DC Orders ED Discharge Orders          Ordered    meclizine (ANTIVERT) 25 MG tablet  3 times daily PRN        01/12/21 2252             Charlesetta Shanks, MD 01/16/21 1756

## 2021-01-12 NOTE — ED Triage Notes (Signed)
Pt tripped and fell on 12/1.  C/o lower back, R arm, and L hip pain.

## 2021-01-12 NOTE — ED Provider Notes (Signed)
Emergency Medicine Provider Triage Evaluation Note  Rhonda Weaver , a 87 y.o. female  was evaluated in triage.  Pt complains of fall onset 01/10/2021.  Patient reports she fell 2 days ago, she lives at home alone.  Patient daughter at bedside.  Patient has associated left hip pain, lower back pain, right-sided neck pain.  She has tried Tylenol at home with relief for his symptoms.  Patient denies chest pain, shortness of breath, dizziness, lightheadedness, vision changes.  Patient denies being on anticoagulants.  Review of Systems  Positive: Left hip pain, right-sided neck pain Negative: Chest pain, shortness of breath  Physical Exam  BP (!) 188/74 (BP Location: Left Arm)   Pulse 87   Temp 98.3 F (36.8 C) (Oral)   Resp 17   SpO2 94%  Gen:   Awake, no distress   Resp:  Normal effort  MSK:   Moves extremities without difficulty   Medical Decision Making  Medically screening exam initiated at 12:21 PM.  Appropriate orders placed.  Lailie C Flott was informed that the remainder of the evaluation will be completed by another provider, this initial triage assessment does not replace that evaluation, and the importance of remaining in the ED until their evaluation is complete.     Eon Zunker A, PA-C 01/12/21 1232    Carmin Muskrat, MD 01/12/21 [phone]

## 2021-01-12 NOTE — ED Notes (Signed)
Received verbal report from Tara D RN at this time 

## 2021-01-12 NOTE — ED Notes (Signed)
Pt transported to MRI 

## 2021-01-12 NOTE — ED Notes (Signed)
Provider at bedside

## 2021-01-21 ENCOUNTER — Telehealth: Payer: Self-pay | Admitting: Pharmacist

## 2021-01-21 NOTE — Chronic Care Management (AMB) (Signed)
    Chronic Care Management Pharmacy Assistant   Name: FEMALE IAFRATE  MRN: [id number] DOB: 11/16/1930  01/22/2021 APPOINTMENT REMINDER   Lois C Zingg's daughter was reminded to have all medications, supplements and any blood glucose and blood pressure readings available for review with Jeni Salles, Pharm. D, at her telephone visit on 01/22/2021 at 1:00.   Questions: Have you had any recent office visit or specialist visit outside of [facility]? No  Are there any concerns you would like to discuss during your office visit? Dementia, Ok in the AM and much worse in the PM starting around 3:00  Are you having any problems obtaining your medications? No  If patient has any PAP medications ask if they are having any problems getting their PAP medication or refill? No  Care Gaps: AWV - message sent to Ramond Craver to schedule. Last BP - 168/80 on 01/12/2019 Covid- 19 vaccine - never done Zoster vaccines - never done Dexa scan - never done Influenza vaccine - overdue   Star Rating Drug: Atorvastatin 20mg  - last filled on 04/02/20 90DS at [facility] Losartan-hydrochlorothiazide 100-12.5mg  - last filled on 07/16/20 90DS at [facility] Verified fill dates with Robin  Any gaps in medications fill history?  [place] Pharmacist Assistant [phone]

## 2021-01-22 ENCOUNTER — Ambulatory Visit: Payer: PPO | Admitting: Pharmacist

## 2021-01-22 DIAGNOSIS — I10 Essential (primary) hypertension: Secondary | ICD-10-CM

## 2021-01-22 DIAGNOSIS — E782 Mixed hyperlipidemia: Secondary | ICD-10-CM

## 2021-01-22 NOTE — Progress Notes (Signed)
Chronic Care Management Pharmacy Note  01/22/2021 Name:  Rhonda Weaver MRN:  [id number] DOB:  [birth year]  Summary: BP not at goal < 140/90 per home readings LDL not at goal < 100  Recommendations/Changes made from today's visit: -Recommended bringing BP cuff to office visit tomorrow to ensure accuracy -Added aspirin back to list as patient continues to take but recommended stopping aspirin due to high risk for bleeding -Consider stopping atorvastatin for primary prevention   Plan: -Follow up BP assessment in 1-2 months  Subjective: Rhonda Weaver is an 87 y.o. year old female who is a primary patient of Koberlein, Steele Berg, MD.  The CCM team was consulted for assistance with disease management and care coordination needs.    Engaged with patient by telephone for follow up visit in response to provider referral for pharmacy case management and/or care coordination services.   Consent to Services:  The patient was given information about Chronic Care Management services, agreed to services, and gave verbal consent prior to initiation of services.  Please see initial visit note for detailed documentation.   Patient Care Team: Caren Macadam, MD as PCP - General (Family Medicine) Viona Gilmore, [facility] as Pharmacist (Pharmacist)  Recent office visits: 08/17/19 Dr. Ethlyn Gallery - Patient presented via video visit for HTN follow up. Patient reported no dizziness or lightheadedness. Systolic BP a bit higher than desired but diastolic is on the lower end - no medication changes at this time to avoid hypotension. Follow up in 6 months for physical exam.  Recent consult visits: None  Hospital visits: 01/12/21 Patient presented to the [facility] ED for a fall. Prescribed meclizine PRN.  Objective:  Lab Results  Component Value Date   CREATININE 1.02 (H) 01/12/2021   BUN 12 01/12/2021   GFR 60.13 01/12/2019   GFRNONAA 53 (L) 01/12/2021   GFRAA 78 03/06/2008    NA 140 01/12/2021   K 3.6 01/12/2021   CALCIUM 9.4 01/12/2021   CO2 26 01/12/2021   GLUCOSE 91 01/12/2021    Lab Results  Component Value Date/Time   HGBA1C 6.1 01/12/2019 11:13 AM   HGBA1C 6.3 03/25/2018 09:56 AM   GFR 60.13 01/12/2019 11:13 AM   GFR 53.91 (L) 03/25/2018 09:56 AM    Last diabetic Eye exam: No results found for: HMDIABEYEEXA  Last diabetic Foot exam: No results found for: HMDIABFOOTEX   Lab Results  Component Value Date   CHOL 214 (H) 01/12/2019   HDL 37.90 (L) 01/12/2019   LDLCALC 110 (H) 03/09/2017   LDLDIRECT 120.0 01/12/2019   TRIG 250.0 (H) 01/12/2019   CHOLHDL 6 01/12/2019    Hepatic Function Latest Ref Rng & Units 01/12/2019 03/11/2013 12/31/2011  Total Protein 6.0 - 8.3 g/dL 6.7 6.9 7.7  Albumin 3.5 - 5.2 g/dL 4.0 3.5 3.5  AST 0 - 37 U/L $Remo'25 18 21  'bEnVl$ ALT 0 - 35 U/L $Remo'26 16 15  'PtVWO$ Alk Phosphatase 39 - 117 U/L 46 44 52  Total Bilirubin 0.2 - 1.2 mg/dL 0.7 0.8 0.7  Bilirubin, Direct 0.0 - 0.3 mg/dL - 0.0 0.1    Lab Results  Component Value Date/Time   TSH 1.13 01/12/2019 11:13 AM   TSH 2.33 03/11/2013 08:17 AM    CBC Latest Ref Rng & Units 01/12/2021 01/12/2019 11/17/2017  WBC 4.0 - 10.5 K/uL 7.0 7.1 6.3  Hemoglobin 12.0 - 15.0 g/dL 14.1 13.6 14.2  Hematocrit 36.0 - 46.0 % 41.5 40.1 42.6  Platelets 150 - 400  K/uL 251 235.0 234.0    Lab Results  Component Value Date/Time   VD25OH 31 12/31/2011 08:52 AM    Clinical ASCVD: No  The ASCVD Risk score (Arnett DK, et al., 2019) failed to calculate for the following reasons:   The 2019 ASCVD risk score is only valid for ages 72 to 85    Depression screen PHQ 2/9 08/17/2019 04/07/2017 07/29/2016  Decreased Interest 0 0 0  Down, Depressed, Hopeless 0 0 0  PHQ - 2 Score 0 0 0      Social History   Tobacco Use  Smoking Status Never  Smokeless Tobacco Never   BP Readings from Last 3 Encounters:  01/12/21 (!) 194/84  08/17/19 140/69  01/12/19 (!) 168/60   Pulse Readings from Last 3 Encounters:   01/12/21 82  08/17/19 72  01/12/19 74   Wt Readings from Last 3 Encounters:  01/12/21 140 lb (63.5 kg)  08/17/19 127 lb 9.6 oz (57.9 kg)  01/12/19 124 lb 14.4 oz (56.7 kg)   BMI Readings from Last 3 Encounters:  01/12/21 26.45 kg/m  08/17/19 23.53 kg/m  01/12/19 23.03 kg/m    Assessment/Interventions: Review of patient past medical history, allergies, medications, health status, including review of consultants reports, laboratory and other test data, was performed as part of comprehensive evaluation and provision of chronic care management services.   SDOH:  (Social Determinants of Health) assessments and interventions performed: No  SDOH Screenings   Alcohol Screen: Not on file  Depression (PHQ2-9): Not on file  Financial Resource Strain: Not on file  Food Insecurity: Not on file  Housing: Not on file  Physical Activity: Not on file  Social Connections: Not on file  Stress: Not on file  Tobacco Use: Low Risk    Smoking Tobacco Use: Never   Smokeless Tobacco Use: Never   Passive Exposure: Not on file  Transportation Needs: Not on file    CCM Care Plan  Allergies  Allergen Reactions   Fenofibrate     REACTION: pt states intolerant   Gemfibrozil     REACTION: pt states intolerant   Niacin     REACTION: pt states intolerant   Penicillins     REACTION: Hx of arm swelling$RemoveBefore' \\T'lNsPRHsfoNSTp$ \ itching after shot   Sulfamethoxazole-Trimethoprim     REACTION: "deathly sick"  w/  N\T\V   Sulfonamide Derivatives     REACTION: nausea and vomiting    Medications Reviewed Today     Reviewed by Agnes Lawrence, CMA (Certified Medical Assistant) on 08/17/19 at 1156  Med List Status: <None>   Medication Order Taking? Sig Documenting Provider Last Dose Status Informant  acetaminophen (TYLENOL) 500 MG tablet [id number] Yes Take 500 mg by mouth every 6 (six) hours as needed. [provider] Taking Active   amLODipine (NORVASC) 5 MG tablet [id number] Yes Take 1 tablet by mouth  once daily Koberlein, Junell C, MD Taking Active   aspirin 81 MG EC tablet [id number] Yes Take 81 mg by mouth daily.   [provider] Taking Active   atenolol (TENORMIN) 50 MG tablet [id number] Yes Take 1 tablet by mouth once daily Koberlein, Junell C, MD Taking Active   atorvastatin (LIPITOR) 20 MG tablet [id number] Yes Take 1 tablet (20 mg total) by mouth daily. Caren Macadam, MD Taking Active   Bismuth Subsalicylate (PEPTO-BISMOL PO) [id number] Yes Take by mouth. [provider] Taking Active   fluticasone (FLONASE) 50 MCG/ACT nasal spray [id number] Yes USE TWO SPRAY(S) IN [facility]  NOSTRIL ONCE DAILY Lucretia Kern, DO Taking Active   fluticasone (FLOVENT HFA) 44 MCG/ACT inhaler [id number] Yes Inhale 2 puffs into the lungs as needed. Caren Macadam, MD Taking Active   losartan-hydrochlorothiazide [place] Day) 100-12.5 MG tablet [id number] Yes Take 1 tablet by mouth once daily Koberlein, Junell C, MD Taking Active   Magnesium Hydroxide (MILK OF MAGNESIA PO) [id number] Yes Take by mouth. [provider] Taking Active             Patient Active Problem List   Diagnosis Date Noted   Allergic rhinitis 09/28/2007   Osteoarthritis 09/28/2007   HYPERLIPIDEMIA, MIXED 10/15/2006   Essential hypertension 10/15/2006   GERD 10/15/2006    Immunization History  Administered Date(s) Administered   Influenza Split 11/11/2010, 11/21/2011, 10/11/2012   Influenza Whole 11/03/2007, 11/17/2008, 11/13/2009   Influenza, High Dose Seasonal PF 11/07/2018   Influenza-Unspecified 11/05/2013, 11/10/2016, 10/11/2017   Pneumococcal Conjugate-13 12/06/2013   Pneumococcal Polysaccharide-23 11/10/2016   Tdap 07/01/2013   Patient wasn't taking her medications prior to her fall. She is now living with her daughter who is making sure she is taking her medications as well as checking her blood pressure every other day.  She requested help with her mother, specifically with bathing, because she  is physically unable to do so with her hurt foot. She won't shower because she said she thought she fell in the shower but won't wash up either, despite her daughter's requests.  Conditions to be addressed/monitored:  Hypertension, Hyperlipidemia, GERD, Osteoarthritis and Constipation  Conditions addressed this visit: Hypertension, hyperlipidemia  Care Plan : CCM Pharmacy Care Plan  Updates made by Viona Gilmore, [place] since 01/22/2021 12:00 AM     Problem: Problem: Hypertension, Hyperlipidemia, GERD, Osteoarthritis and Constipation      Long-Range Goal: Patient-Specific Goal   Start Date: 06/19/2020  Expected End Date: 06/19/2021  Recent Progress: On track  Priority: High  Note:   Current Barriers:  Unable to achieve control of blood pressure and cholesterol  Unable to self administer medications as prescribed  Pharmacist Clinical Goal(s):  Patient will achieve adherence to monitoring guidelines and medication adherence to achieve therapeutic efficacy achieve control of blood pressure as evidenced by home and office blood pressure readings achieve ability to self administer medications as prescribed through use of pillbox as evidenced by patient report through collaboration with PharmD and provider.   Interventions: 1:1 collaboration with Caren Macadam, MD regarding development and update of comprehensive plan of care as evidenced by provider attestation and co-signature Inter-disciplinary care team collaboration (see longitudinal plan of care) Comprehensive medication review performed; medication list updated in electronic medical record  Hypertension (BP goal <140/90) -Uncontrolled -Current treatment: amlodipine 5mg , 1 tablet daily - in AM atenolol 50mg , 1 tablet daily - in AM losartan/HCTZ 100/12.5mg , 1 tablet daily - in AM -Medications previously tried: valsartan  -Current home readings: 177/79, 181/75, 127/61 HR 71, 113/67 HR 70 (trying to check every other day)  - arm cuff (checking an hour after meds) -Current dietary habits: did not discuss -Current exercise habits: walking some -Denies hypotensive/hypertensive symptoms -Educated on BP goals and benefits of medications for prevention of heart attack, stroke and kidney damage; Importance of home blood pressure monitoring; Proper BP monitoring technique; -Counseled to monitor BP at home twice weekly, document, and provide log at future appointments -Counseled on diet and exercise extensively Recommended to continue current medication Recommended bringing BP cuff to next in office appointment.  Hyperlipidemia: (LDL goal < 100) -Uncontrolled -  Current treatment: atorvastatin 20mg , 1 tablet daily  -Medications previously tried: fenofibrate, gemfibrozil, niacin -Current dietary patterns: did not discuss -Current exercise habits: walking some -Educated on Cholesterol goals;  Benefits of statin for ASCVD risk reduction; Importance of limiting foods high in cholesterol; -Counseled on diet and exercise extensively Recommended to continue current medication  GERD (Goal: minimize symptoms) -Controlled -Current treatment  Pepto-Bismol: uses for upset stomach - not currently using Tums as needed for heartburn -Medications previously tried: n/a  -Recommended avoiding use of Pepto-Bismol (interaction with aspirin use- both are salicylates and may increase incidence of bleeding.)   Congestion (Goal: minimize symptoms) -Controlled -Current treatment  Flovent HFA inhaler, 2 puffs as needed (patient uses as needed for severe bad congestion- last time.she used it was about 1 month ago).  fluticasone nasal spray- uses as needed -Medications previously tried: none  -Recommended to continue current medication  Osteoarthritis (Goal: minimize pain) -Controlled -Current treatment  Tylenol 500mg  every six hours as needed  -Medications previously tried: n/a  -Recommended to continue current  medication  Constipation (Goal: regular bowel movements) -Controlled -Current treatment  milk of magnesia (patient uses every 2 to 3 weeks as needed) -Medications previously tried: none  -Recommended to continue current medication  Health Maintenance -Vaccine gaps: COVID (declines), shingrix -Current therapy:  Aspirin 81 mg 1 tablet daily -Educated on Cost vs benefit of each product must be carefully weighed by individual consumer -Patient is satisfied with current therapy and denies issues -Counseled on risks vs benefits of aspirin therapy  Patient Goals/Self-Care Activities Patient will:  - take medications as prescribed focus on medication adherence by using a pillbox check blood pressure weekly, document, and provide at future appointments  Follow Up Plan: Telephone follow up appointment with care management team member scheduled for: 4 months      Medication Assistance: None required.  Patient affirms current coverage meets needs.  Compliance/Adherence/Medication fill history: Care Gaps: Shingrix, colonoscopy, COVID booster  Star-Rating Drugs: Atorvastatin 20mg  - last filled on 04/02/20 90DS at [facility] 100-12.5mg  - last filled on 07/16/20 90DS at [facility] preferred pharmacy is:  [facility], [address] [place] [address] Phone: [phone] Fax: [phone]  Uses pill box? Yes - weekly - AM/PM Pt endorses 100% compliance - never misses a dose (daughter reports she does miss doses as she checks behind her in the pillbox)  We discussed: Benefits of medication synchronization, packaging and delivery as well as enhanced pharmacist oversight with [facility]. Patient decided to: Continue current medication management strategy  Care Plan and Follow Up Patient Decision:  Patient agrees to Care Plan and Follow-up.  Plan: Telephone follow up appointment with care management  team member scheduled for:  4 months  Jeni Salles, PharmD [place] Pharmacist [facility] at [facility] [phone]

## 2021-01-23 ENCOUNTER — Ambulatory Visit (INDEPENDENT_AMBULATORY_CARE_PROVIDER_SITE_OTHER): Payer: PPO | Admitting: Family Medicine

## 2021-01-23 ENCOUNTER — Encounter: Payer: Self-pay | Admitting: Family Medicine

## 2021-01-23 VITALS — BP 140/80 | HR 75 | Temp 98.2°F | Ht 61.0 in | Wt 113.3 lb

## 2021-01-23 DIAGNOSIS — E559 Vitamin D deficiency, unspecified: Secondary | ICD-10-CM

## 2021-01-23 DIAGNOSIS — R918 Other nonspecific abnormal finding of lung field: Secondary | ICD-10-CM

## 2021-01-23 DIAGNOSIS — Z23 Encounter for immunization: Secondary | ICD-10-CM

## 2021-01-23 DIAGNOSIS — R413 Other amnesia: Secondary | ICD-10-CM | POA: Diagnosis not present

## 2021-01-23 DIAGNOSIS — E782 Mixed hyperlipidemia: Secondary | ICD-10-CM | POA: Diagnosis not present

## 2021-01-23 DIAGNOSIS — R011 Cardiac murmur, unspecified: Secondary | ICD-10-CM

## 2021-01-23 DIAGNOSIS — E041 Nontoxic single thyroid nodule: Secondary | ICD-10-CM | POA: Diagnosis not present

## 2021-01-23 LAB — COMPREHENSIVE METABOLIC PANEL
ALT: 16 U/L (ref 0–35)
AST: 23 U/L (ref 0–37)
Albumin: 4.1 g/dL (ref 3.5–5.2)
Alkaline Phosphatase: 52 U/L (ref 39–117)
BUN: 17 mg/dL (ref 6–23)
CO2: 34 mEq/L — ABNORMAL HIGH (ref 19–32)
Calcium: 10.1 mg/dL (ref 8.4–10.5)
Chloride: 97 mEq/L (ref 96–112)
Creatinine, Ser: 0.95 mg/dL (ref 0.40–1.20)
GFR: 53.66 mL/min — ABNORMAL LOW (ref >60.00)
Glucose, Bld: 109 mg/dL — ABNORMAL HIGH (ref 70–99)
Potassium: 3.3 mEq/L — ABNORMAL LOW (ref 3.5–5.1)
Sodium: 139 mEq/L (ref 135–145)
Total Bilirubin: 0.8 mg/dL (ref 0.2–1.2)
Total Protein: 7.6 g/dL (ref 6.0–8.3)

## 2021-01-23 LAB — CBC WITH DIFFERENTIAL/PLATELET
Basophils Absolute: 0.1 10*3/uL (ref 0.0–0.1)
Basophils Relative: 0.6 % (ref 0.0–3.0)
Eosinophils Absolute: 0.1 10*3/uL (ref 0.0–0.7)
Eosinophils Relative: 1.1 % (ref 0.0–5.0)
HCT: 43.1 % (ref 36.0–46.0)
Hemoglobin: 14.4 g/dL (ref 12.0–15.0)
Lymphocytes Relative: 26.9 % (ref 12.0–46.0)
Lymphs Abs: 2.3 10*3/uL (ref 0.7–4.0)
MCHC: 33.5 g/dL (ref 30.0–36.0)
MCV: 98.5 fl (ref 78.0–100.0)
Monocytes Absolute: 0.9 10*3/uL (ref 0.1–1.0)
Monocytes Relative: 10.4 % (ref 3.0–12.0)
Neutro Abs: 5.3 10*3/uL (ref 1.4–7.7)
Neutrophils Relative %: 61 % (ref 43.0–77.0)
Platelets: 252 10*3/uL (ref 150.0–400.0)
RBC: 4.37 Mil/uL (ref 3.87–5.11)
RDW: 13.3 % (ref 11.5–15.5)
WBC: 8.7 10*3/uL (ref 4.0–10.5)

## 2021-01-23 LAB — LIPID PANEL
Cholesterol: 215 mg/dL — ABNORMAL HIGH (ref 0–200)
HDL: 42.5 mg/dL (ref >39.00)
NonHDL: 172.43
Total CHOL/HDL Ratio: 5
Triglycerides: 225 mg/dL — ABNORMAL HIGH (ref 0.0–149.0)
VLDL: 45 mg/dL — ABNORMAL HIGH (ref 0.0–40.0)

## 2021-01-23 LAB — FOLATE: Folate: >23.4 ng/mL (ref >5.9)

## 2021-01-23 LAB — VITAMIN B12: Vitamin B-12: 406 pg/mL (ref 211–911)

## 2021-01-23 LAB — TSH: TSH: 0.95 u[IU]/mL (ref 0.35–5.50)

## 2021-01-23 LAB — VITAMIN D 25 HYDROXY (VIT D DEFICIENCY, FRACTURES): VITD: 31.46 ng/mL (ref 30.00–100.00)

## 2021-01-23 LAB — LDL CHOLESTEROL, DIRECT: Direct LDL: 145 mg/dL

## 2021-01-23 MED ORDER — DONEPEZIL HCL 5 MG PO TABS: 5.0000 mg | ORAL_TABLET | Freq: Every day | ORAL | 1 refills | Status: DC

## 2021-01-23 NOTE — Patient Instructions (Signed)
Bp goal 110-140/65-90

## 2021-01-23 NOTE — Progress Notes (Signed)
DEMANI WEYRAUCH DOB: 02-May-1930 Encounter date: 01/23/2021  This is a 87 y.o. female who presents with Chief Complaint  Patient presents with   Follow-up    History of present illness:  No specific concerns today. Was in the ER on 12/3 with fall. She states she just got up and lost balance. Was on phone with daughter and said to hold on and then didn't come back on phone. SIL found her laying on floor, chair was turned over as well. Bruising on arm, back. Imaging from ER was negative for fractures. She was told she had vertigo.   She did tell daughter she was dizzy at that time getting back to couch. Patient is staying with daughter now. Daughter states she is busy at night time. States that she will go to bed, but then hears her getting up, going to restroom and then starts doing things in closet and moving furniture, talking. 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 Starts with this around 3pm in afternoon. Daughter didn't notice the memory issue prior to this fall. But after daughter broke ankle and didn't see her for about 3 weeks in person(husband was going to check on her). Once she fell and was back at house then daughter started picking up on memory issues.   Caragh lives in mobile home with carpet in living room, linoleum. Has 2 bathtubs in home, but takes seat bath.   Taking meds regularly at daughter's house.   Having some constipation, but also eating better at daughter's house.  Daughter has done finances for her for several years. Started with patient forgetting/not getting them paid.   Daughter is concerned about her decline and feels that she would be better off living with her so that she could be monitored/helped a little more closely.  She has concerns about patient's ability to manage and take medications properly, manage personal health and hygiene, and concerns about her driving.  Allergies  Allergen Reactions    Fenofibrate     REACTION: pt states intolerant   Gemfibrozil     REACTION: pt states intolerant   Niacin     REACTION: pt states intolerant   Penicillins     REACTION: Hx of arm swelling \\T \ itching after shot   Sulfamethoxazole-Trimethoprim     REACTION: "deathly sick"  w/  N\T\V   Sulfonamide Derivatives     REACTION: nausea and vomiting   Current Meds  Medication Sig   acetaminophen (TYLENOL) 500 MG tablet Take 500 mg by mouth every 6 (six) hours as needed.   amLODipine (NORVASC) 5 MG tablet Take 1 tablet (5 mg total) by mouth daily.   aspirin EC 81 MG tablet Take 81 mg by mouth daily. Swallow whole.   atenolol (TENORMIN) 50 MG tablet Take 1 tablet (50 mg total) by mouth daily.   atorvastatin (LIPITOR) 20 MG tablet Take 1 tablet by mouth once daily   Bismuth Subsalicylate (PEPTO-BISMOL PO) Take by mouth.   fluticasone (FLONASE) 50 MCG/ACT nasal spray USE TWO SPRAY(S) IN EACH NOSTRIL ONCE DAILY   fluticasone (FLOVENT HFA) 44 MCG/ACT inhaler Inhale 2 puffs into the lungs as needed.   losartan-hydrochlorothiazide (HYZAAR) 100-12.5 MG tablet Take 1 tablet by mouth once daily   Magnesium Hydroxide (MILK OF MAGNESIA PO) Take by mouth.   meclizine (ANTIVERT) 25 MG tablet Take 1 tablet (25 mg total) by mouth 3 (three) times daily as needed for dizziness.  Review of Systems  Constitutional:  Negative for chills, fatigue and fever.  Respiratory:  Negative for cough, chest tightness, shortness of breath and wheezing.   Cardiovascular:  Negative for chest pain, palpitations and leg swelling.  Skin:        Bruising on skin from fall is resolving.  Neurological:  Negative for headaches. Dizziness: she doesn't feel dizzy; just states that sometimes with changes in position she may note a little light headedness. Psychiatric/Behavioral:         Patient states she is doing well, but daughter states that in the afternoon patient's memory starts to get worse.  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  She is up through the night moving furniture around and doing different tasks in the bedroom.  She talks about people and places that are from the past as if they are in present time.   Objective:  BP 140/80 Comment: repeated by Rachel--jaf   Pulse 75    Temp 98.2 F (36.8 C) (Oral)    Ht 5\' 1"  (1.549 m)    Wt 113 lb 4.8 oz (51.4 kg)    SpO2 96%    BMI 21.41 kg/m   Weight: 113 lb 4.8 oz (51.4 kg)   BP Readings from Last 3 Encounters:  01/23/21 140/80  01/12/21 (!) 194/84  08/17/19 140/69   Wt Readings from Last 3 Encounters:  01/23/21 113 lb 4.8 oz (51.4 kg)  01/12/21 140 lb (63.5 kg)  08/17/19 127 lb 9.6 oz (57.9 kg)    Physical Exam Constitutional:      General: She is awake. She is not in acute distress.    Appearance: She is well-developed.  HENT:     Right Ear: Ear canal and external ear normal. Impacted cerumen: there is some wax right ear canal.     Left Ear: Tympanic membrane, ear canal and external ear normal.  Cardiovascular:     Rate and Rhythm: Normal rate and regular rhythm.     Heart sounds: Murmur heard.  Crescendo systolic murmur is present with a grade of 2/6.    No friction rub.  Pulmonary:     Effort: Pulmonary effort is normal. No respiratory distress.     Breath sounds: Normal breath sounds. No wheezing or rales.  Musculoskeletal:     Right lower leg: No edema.     Left lower leg: No edema.  Skin:    Comments: Resolving bruise left upper back  Neurological:     Mental Status: She is alert and oriented to person, place, and time.  Psychiatric:        Mood and Affect: Mood normal.        Behavior: Behavior normal. Behavior is cooperative.        Cognition and Memory: Memory is impaired. She exhibits impaired recent memory.     Comments: Mood is good overall. Patient is not a good historian and it is evident with talking to her that she is not recalling recent events well.     Assessment/Plan  1.  Thyroid nodule Incidental finding on imaging in the ER.  We will get ultrasound to further evaluate. - US THYROID; Future - TSH; Future - AMB Referral to Community Care Coordinaton - TSH  2. Lung nodules Incidental finding on imaging in ER.  We will get further imaging to evaluate. - CT Chest Wo Contrast; Future - AMB Referral to [facility]  3. Vitamin D deficiency - VITAMIN D 25 Hydroxy (Vit-D Deficiency, Fractures); Future - AMB Referral to [facility] - VITAMIN D 25 Hydroxy (Vit-D Deficiency, Fractures)  4. Heart murmur Noted on exam today and more significant than previous.  We will get echo to further evaluate. - ECHOCARDIOGRAM COMPLETE; Future - AMB Referral to [facility]  5. Memory deficit Discussed starting Aricept, to which patient agrees.  There is some memory deficit that has come up in the last year since I have seen her last.  I would like to do more formal memory evaluation at follow-up visit. Discussed new medication(s) today with patient. Discussed potential side effects and patient verbalized understanding.   - CBC with Differential/Platelet; Future - Comprehensive metabolic panel; Future - Vitamin B12; Future - Folate; Future - ANA; Future - AMB Referral to Community Care Coordinaton - ANA - Folate - Vitamin B12 - Comprehensive metabolic panel - CBC with Differential/Platelet  6. Mixed hyperlipidemia - Lipid panel; Future - AMB Referral to Community Care Coordinaton - Lipid panel  7. Need for prophylactic vaccination against Streptococcus pneumoniae (pneumococcus) - Pneumococcal conjugate vaccine 20-valent (Prevnar 20)     We are going to get community care coordination involved to help better care for patient.  We do have pharmacy already on board, which is helpful.  We discussed patient staying at her daughter's house, which should help with med compliance as well as any other medical needs.  Patient may  need some assistance with self-care, but is not ready to accept help for this at this point.  I think that with familiarity through community care coordinator, she will be more agreeable to interventions that may help her.  We discussed her not driving.  Once we are able to get results back from above imaging and lab evaluation, we will have her follow back up in the office to continue to discuss management plan.  42 minutes spent in chart review, discussion with pharmacist regarding daughter's concerns with patient, exam, discussion of follow up plan and interventions for help with medication compliance and safety.     Micheline Rough, MD

## 2021-01-24 ENCOUNTER — Other Ambulatory Visit: Payer: PPO

## 2021-01-24 ENCOUNTER — Telehealth: Payer: Self-pay | Admitting: *Deleted

## 2021-01-24 NOTE — Chronic Care Management (AMB) (Signed)
°  Chronic Care Management   Note  01/24/2021 Name: Rhonda Weaver MRN: [id number] DOB: 13-May-1930  Rhonda Weaver is a 87 y.o. year old female who is a primary care patient of Koberlein, Steele Berg, MD and is actively engaged with the care management team. I reached out to Rhonda Weaver by phone today to assist with scheduling an initial visit with the RN Case Manager  Follow up plan: Telephone appointment with care management team member scheduled for:01/25/21  [facility] Management  Direct Dial: [phone]

## 2021-01-25 ENCOUNTER — Ambulatory Visit (INDEPENDENT_AMBULATORY_CARE_PROVIDER_SITE_OTHER): Payer: PPO

## 2021-01-25 DIAGNOSIS — E782 Mixed hyperlipidemia: Secondary | ICD-10-CM

## 2021-01-25 DIAGNOSIS — I10 Essential (primary) hypertension: Secondary | ICD-10-CM

## 2021-01-25 DIAGNOSIS — R413 Other amnesia: Secondary | ICD-10-CM

## 2021-01-25 LAB — ANA: Anti Nuclear Antibody (ANA): NEGATIVE

## 2021-01-25 NOTE — Chronic Care Management (AMB) (Signed)
Chronic Care Management   CCM RN Visit Note  01/25/2021 Name: Rhonda Weaver MRN: [id number] DOB: 1930/11/23  Subjective: Rhonda Weaver is a 87 y.o. year old female who is a primary care patient of Koberlein, Steele Berg, MD. The care management team was consulted for assistance with disease management and care coordination needs.    Engaged with patient by telephone for follow up visit in response to provider referral for case management and/or care coordination services.   Consent to Services:  The patient was given information about Chronic Care Management services, agreed to services, and gave verbal consent prior to initiation of services.  Please see initial visit note for detailed documentation.   Patient agreed to services and verbal consent obtained.   Assessment: Review of patient past medical history, allergies, medications, health status, including review of consultants reports, laboratory and other test data, was performed as part of comprehensive evaluation and provision of chronic care management services.   SDOH (Social Determinants of Health) assessments and interventions performed:  SDOH Interventions    Flowsheet Row Most Recent Value  SDOH Interventions   Food Insecurity Interventions Intervention Not Indicated  Housing Interventions Intervention Not Indicated  Stress Interventions Intervention Not Indicated  Transportation Interventions Intervention Not Indicated        CCM Care Plan  Allergies  Allergen Reactions   Fenofibrate     REACTION: pt states intolerant   Gemfibrozil     REACTION: pt states intolerant   Niacin     REACTION: pt states intolerant   Penicillins     REACTION: Hx of arm swelling$RemoveBefore' \\T'[id number]$ \ itching after shot   Sulfamethoxazole-Trimethoprim     REACTION: "deathly sick"  w/  N\T\V   Sulfonamide Derivatives     REACTION: nausea and vomiting    Outpatient Encounter Medications as of 01/25/2021  Medication Sig   acetaminophen  (TYLENOL) 500 MG tablet Take 500 mg by mouth every 6 (six) hours as needed.   amLODipine (NORVASC) 5 MG tablet Take 1 tablet (5 mg total) by mouth daily.   atenolol (TENORMIN) 50 MG tablet Take 1 tablet (50 mg total) by mouth daily.   atorvastatin (LIPITOR) 20 MG tablet Take 1 tablet by mouth once daily   Bismuth Subsalicylate (PEPTO-BISMOL PO) Take by mouth.   donepezil (ARICEPT) 5 MG tablet Take 1 tablet (5 mg total) by mouth at bedtime.   fluticasone (FLONASE) 50 MCG/ACT nasal spray USE TWO SPRAY(S) IN EACH NOSTRIL ONCE DAILY   fluticasone (FLOVENT HFA) 44 MCG/ACT inhaler Inhale 2 puffs into the lungs as needed.   losartan-hydrochlorothiazide (HYZAAR) 100-12.5 MG tablet Take 1 tablet by mouth once daily   Magnesium Hydroxide (MILK OF MAGNESIA PO) Take by mouth.   meclizine (ANTIVERT) 25 MG tablet Take 1 tablet (25 mg total) by mouth 3 (three) times daily as needed for dizziness.   No facility-administered encounter medications on file as of 01/25/2021.    Patient Active Problem List   Diagnosis Date Noted   Allergic rhinitis 09/28/2007   Osteoarthritis 09/28/2007   HYPERLIPIDEMIA, MIXED 10/15/2006   Essential hypertension 10/15/2006   GERD 10/15/2006    Conditions to be addressed/monitored:HTN, HLD, Dementia, and Osteoarthritis  Care Plan : RN Care Manager Plan of Care  Updates made by Dimitri Ped, RN since 01/25/2021 12:00 AM     Problem: Chronic Disease Management and Care Coordination Needs (Dementia,HTN, osteoarthritis and HLD)   Priority: High     Long-Range Goal: Establish Plan of Care for Chronic  Disease Management Needs (Dementia,HTN, osteoarthritis and HLD)   Start Date: 01/25/2021  Expected End Date: 07/24/2021  Priority: High  Note:   Current Barriers:  Knowledge Deficits related to plan of care for management of CAD, HLD, Dementia, and Osteoarthritis  Care Coordination needs related to Level of care concerns, ADL IADL limitations, Memory Deficits,  Inability to perform ADL's independently, Inability to perform IADL's independently, and Lacks knowledge of community resource: personal care services Chronic Disease Management support and education needs related to HTN, HLD, Dementia, and Osteoarthritis  Spoke with daughter [name] pt gives consent to speak with her.  States that pt is now staying with her as she started becoming confused in the afternoon and night.  States she had slowly been getting confused but became worse after she had a fall.  States she is now sure pt is bathing but she dresses herself.  Daughter states she will need help to bath pt and would like to get help if possible.  States pt does not know her later in the day and she is restless and roams around the house.  Daughter states it is stressful.  States she is giving pt her medications and making sure she is eating and drinking enough fluids. States her B/P is usually around 140/80-160/70.  RNCM Clinical Goal(s):  Patient will verbalize understanding of plan for management of HTN, HLD, Dementia, and Osteoarthritis as evidenced by voiced adherence to plan of care verbalize basic understanding of  HTN, HLD, Dementia, and Osteoarthritis disease process and self health management plan as evidenced by voiced understanding and teach back take all medications exactly as prescribed and will call provider for medication related questions as evidenced by dispense report and pt verbalization attend all scheduled medical appointments: CCM PharmD 06/04/21 as evidenced by medical records demonstrate Improved adherence to prescribed treatment plan for HTN, HLD, Dementia, and Osteoarthritis as evidenced by readings within limits, voiced adherence to plan of care continue to work with RN Care Manager to address care management and care coordination needs related to  HTN, HLD, Dementia, and Osteoarthritis as evidenced by adherence to CM Team Scheduled appointments work with Education officer, museum to  address  related to the management of Level of care concerns, ADL IADL limitations, Memory Deficits, Inability to perform ADL's independently, Inability to perform IADL's independently, and Lacks knowledge of community resource: caregiver stress related to the management of HTN, HLD, Dementia, and Osteoarthritis as evidenced by review of EMR and patient or Education officer, museum report work with Data processing manager care guide to address needs related to  ADL IADL limitations, Memory Deficits, Inability to perform ADL's independently, Inability to perform IADL's independently, and Lacks knowledge of community resource: personal care services as evidenced by patient and/or community resource care guide support through collaboration with Consulting civil engineer, provider, and care team.   Interventions: 1:1 collaboration with primary care provider regarding development and update of comprehensive plan of care as evidenced by provider attestation and co-signature Inter-disciplinary care team collaboration (see longitudinal plan of care) Evaluation of current treatment plan related to  self management and patient's adherence to plan as established by provider   Hyperlipidemia Interventions:  (Status:  New goal.) Long Term Goal Medication review performed; medication list updated in electronic medical record.  Provider established cholesterol goals reviewed Counseled on importance of regular laboratory monitoring as prescribed Reviewed role and benefits of statin for ASCVD risk reduction  Hypertension Interventions:  (Status:  New goal.) Long Term Goal Last practice recorded BP readings:  BP Readings from Last 3 Encounters:  01/23/21 140/80  01/12/21 (!) 194/84  08/17/19 140/69  Most recent eGFR/CrCl: No results found for: EGFR  No components found for: CRCL  Evaluation of current treatment plan related to hypertension self management and patient's adherence to plan as established by provider Provided education to  patient re: stroke prevention, s/s of heart attack and stroke Reviewed medications with patient and discussed importance of compliance Discussed plans with patient for ongoing care management follow up and provided patient with direct contact information for care management team Advised patient, providing education and rationale, to monitor blood pressure daily and record, calling PCP for findings outside established parameters Provided education on prescribed diet low sodium  Dementia:  (Status:  New goal.)  Long Term Goal Evaluation of current treatment plan related to misuse of: with behavioral disturbance Made referral to LCSW for caregiver stress and level of care issues, Care guide referral made for personal care services and Medicaid resources, Reviewed medications including Aricept , Depression screen completed, Emotional Support Provided to patient/caregiver, Consideration of in-home help encouraged , Referred to LCSW for assistance with Guardianship discussion, Discussed importance of discussing diagnosis with provider, Discussed importance of attendance to all provider appointments, and Reviewed importance of home safety and to consider putting alarms on doors if pt wonders at night  Patient Goals/Self-Care Activities: Take all medications as prescribed Attend all scheduled provider appointments Call provider office for new concerns or questions  check blood pressure 3 times per week choose a place to take my blood pressure (home, clinic or office, retail store) take blood pressure log to all doctor appointments eat more whole grains, fruits and vegetables, lean meats and healthy fats limit salt intake to $RemoveB'2300mg'wXQwxTHL$ /day call for medicine refill 2 or 3 days before it runs out take all medications exactly as prescribed call doctor with any symptoms you believe are related to your medicine  Follow Up Plan:  Telephone follow up appointment with care management team member scheduled for:   02/05/21 The patient has been provided with contact information for the care management team and has been advised to call with any health related questions or concerns.       Plan:Telephone follow up appointment with care management team member scheduled for:  02/05/21 The patient has been provided with contact information for the care management team and has been advised to call with any health related questions or concerns.  Peter Garter RN, Jackquline Denmark, CDE Care Management Coordinator [facility] [phone], Mobile [phone]

## 2021-01-25 NOTE — Patient Instructions (Signed)
Visit Information   Thank you for taking time to visit with me today. Please don't hesitate to contact me if I can be of assistance to you before our next scheduled telephone appointment. Dementia Caregiver Guide Dementia is a term used to describe a number of symptoms that affect memory and thinking. The most common symptoms include: Memory loss. Trouble with language and communication. Trouble concentrating. Poor judgment and problems with reasoning. Wandering from home or public places. Extreme anxiety or depression. Being suspicious or having angry outbursts and accusations. Child-like behavior and language. Dementia can be frightening and confusing. And taking care of someone with dementia can be challenging. This guide provides tips to help you when providing care for a person with dementia. How to help manage lifestyle changes Dementia usually gets worse slowly over time. In the early stages, people with dementia can stay independent and safe with some help. In later stages, they need help with daily tasks such as dressing, grooming, and using the bathroom. There are actions you can take to help a person manage his or her life while living with this condition. Communicating When the person is talking or seems frustrated, make eye contact and hold the person's hand. Ask specific questions that need yes or no answers. Use simple words, short sentences, and a calm voice. Only give one direction at a time. When offering choices, limit the person to just one or two. Avoid correcting the person in a negative way. If the person is struggling to find the right words, gently try to help him or her. Preventing injury  Keep floors clear of clutter. Remove rugs, magazine racks, and floor lamps. Keep hallways well lit, especially at night. Put a handrail and nonslip mat in the bathtub or shower. Put childproof locks on cabinets that contain dangerous items, such as medicines, alcohol, guns,  toxic cleaning items, sharp tools or utensils, matches, and lighters. For doors to the outside of the house, put the locks in places where the person cannot see or reach them easily. This will help ensure that the person does not wander out of the house and get lost. Be prepared for emergencies. Keep a list of emergency phone numbers and addresses in a convenient area. Remove car keys and lock garage doors so that the person does not try to get in the car and drive. Have the person wear a bracelet that tracks locations and identifies the person as having memory problems. This should be worn at all times for safety. Helping with daily life  Keep the person on track with his or her routine. Try to identify areas where the person may need help. Be supportive, patient, calm, and encouraging. Gently remind the person that adjusting to changes takes time. Help with the tasks that the person has asked for help with. Keep the person involved in daily tasks and decisions as much as possible. Encourage conversation, but try not to get frustrated if the person struggles to find words or does not seem to appreciate your help. How to recognize stress Look for signs of stress in yourself and in the person you are caring for. If you notice signs of stress, take steps to manage it. Symptoms of stress include: Feeling anxious, irritable, frustrated, or angry. Denying that the person has dementia or that his or her symptoms will not improve. Feeling depressed, hopeless, or unappreciated. Difficulty sleeping. Difficulty concentrating. Developing stress-related health problems. Feeling like you have too little time for your own life. Follow these  instructions at home: Take care of your health Make sure that you and the person you are caring for: Get regular sleep. Exercise regularly. Eat regular, nutritious meals. Take over-the-counter and prescription medicines only as told by your health care  providers. Drink enough fluid to keep your urine pale yellow. Attend all scheduled health care appointments.  General instructions Join a support group with others who are caregivers. Ask about respite care resources. Respite care can provide short-term care for the person so that you can have a regular break from the stress of caregiving. Consider any safety risks and take steps to avoid them. Organize medicines in a pill box for each day of the week. Create a plan to handle any legal or financial matters. Get legal or financial advice if needed. Keep a calendar in a central location to remind the person of appointments or other activities. Where to find support: Many individuals and organizations offer support. These include: Support groups for people with dementia. Support groups for caregivers. Counselors or therapists. Home health care services. Adult day care centers. Where to find more information Centers for Disease Control and Prevention: http://www.wolf.info/ Alzheimer's Association: [place].co.nz Family Caregiver Alliance: www.caregiver.[place]: www.alzfdn.org Contact a health care provider if: The person's health is rapidly getting worse. You are no longer able to care for the person. Caring for the person is affecting your physical and emotional health. You are feeling depressed or anxious about caring for the person. Get help right away if: The person threatens himself or herself, you, or anyone else. You feel depressed or sad, or feel that you want to harm yourself. If you ever feel like your loved one may hurt himself or herself or others, or if he or she shares thoughts about taking his or her own life, get help right away. You can go to your nearest emergency department or: Call your local emergency services (911 in the [place] Call a suicide crisis helpline, such as the [facility] at (831)240-3625 or 988 in the [place]. This  is open 24 hours a day in the [place] Text the Crisis Text Line at (519) 487-6745 (in the [place].). Summary Dementia is a term used to describe a number of symptoms that affect memory and thinking. Dementia usually gets worse slowly over time. Take steps to reduce the person's risk of injury and to plan for future care. Caregivers need support, relief from caregiving, and time for their own lives. This information is not intended to replace advice given to you by your health care provider. Make sure you discuss any questions you have with your health care provider. Document Revised: 08/22/2020 Document Reviewed: 06/13/2019 Elsevier Patient Education  2022 [place] are the goals we discussed today:  Take all medications as prescribed Attend all scheduled provider appointments Call provider office for new concerns or questions  check blood pressure 3 times per week choose a place to take my blood pressure (home, clinic or office, retail store) take blood pressure log to all doctor appointments eat more whole grains, fruits and vegetables, lean meats and healthy fats limit salt intake to $RemoveB'2300mg'eHMYbzJt$ /day call for medicine refill 2 or 3 days before it runs out take all medications exactly as prescribed call doctor with any symptoms you believe are related to your medicine  Our next appointment is by telephone on 02/05/21 at 10 AM  Please call the care guide team at [phone] if you need to cancel or reschedule your appointment.  If you are experiencing a Mental Health or [place] or need someone to talk to, please call the Suicide and Crisis Lifeline: 988 call 1-800-273-TALK (toll free, 24 hour hotline) go to [facility] [address], [place] [phone]) call 911   Following is a copy of your full care plan:  Care Plan : RN Care Manager Plan of Care  Updates made by Dimitri Ped, RN since 01/25/2021 12:00 AM      Problem: Chronic Disease Management and Care Coordination Needs (Dementia,HTN, osteoarthritis and HLD)   Priority: High     Long-Range Goal: Establish Plan of Care for Chronic Disease Management Needs (Dementia,HTN, osteoarthritis and HLD)   Start Date: 01/25/2021  Expected End Date: 07/24/2021  Priority: High  Note:   Current Barriers:  Knowledge Deficits related to plan of care for management of CAD, HLD, Dementia, and Osteoarthritis  Care Coordination needs related to Level of care concerns, ADL IADL limitations, Memory Deficits, Inability to perform ADL's independently, Inability to perform IADL's independently, and Lacks knowledge of community resource: personal care services Chronic Disease Management support and education needs related to HTN, HLD, Dementia, and Osteoarthritis  Spoke with daughter [name] pt gives consent to speak with her.  States that pt is now staying with her as she started becoming confused in the afternoon and night.  States she had slowly been getting confused but became worse after she had a fall.  States she is now sure pt is bathing but she dresses herself.  Daughter states she will need help to bath pt and would like to get help if possible.  States pt does not know her later in the day and she is restless and roams around the house.  Daughter states it is stressful.  States she is giving pt her medications and making sure she is eating and drinking enough fluids. States her B/P is usually around 140/80-160/70.  RNCM Clinical Goal(s):  Patient will verbalize understanding of plan for management of HTN, HLD, Dementia, and Osteoarthritis as evidenced by voiced adherence to plan of care verbalize basic understanding of  HTN, HLD, Dementia, and Osteoarthritis disease process and self health management plan as evidenced by voiced understanding and teach back take all medications exactly as prescribed and will call provider for medication related questions as  evidenced by dispense report and pt verbalization attend all scheduled medical appointments: CCM PharmD 06/04/21 as evidenced by medical records demonstrate Improved adherence to prescribed treatment plan for HTN, HLD, Dementia, and Osteoarthritis as evidenced by readings within limits, voiced adherence to plan of care continue to work with RN Care Manager to address care management and care coordination needs related to  HTN, HLD, Dementia, and Osteoarthritis as evidenced by adherence to CM Team Scheduled appointments work with Education officer, museum to address  related to the management of Level of care concerns, ADL IADL limitations, Memory Deficits, Inability to perform ADL's independently, Inability to perform IADL's independently, and Lacks knowledge of community resource: caregiver stress related to the management of HTN, HLD, Dementia, and Osteoarthritis as evidenced by review of EMR and patient or Education officer, museum report work with Data processing manager care guide to address needs related to  ADL IADL limitations, Memory Deficits, Inability to perform ADL's independently, Inability to perform IADL's independently, and Lacks knowledge of community resource: personal care services as evidenced by patient and/or community resource care guide support through collaboration with Consulting civil engineer, provider, and care team.   Interventions: 1:1  collaboration with primary care provider regarding development and update of comprehensive plan of care as evidenced by provider attestation and co-signature Inter-disciplinary care team collaboration (see longitudinal plan of care) Evaluation of current treatment plan related to  self management and patient's adherence to plan as established by provider   Hyperlipidemia Interventions:  (Status:  New goal.) Long Term Goal Medication review performed; medication list updated in electronic medical record.  Provider established cholesterol goals reviewed Counseled on importance of  regular laboratory monitoring as prescribed Reviewed role and benefits of statin for ASCVD risk reduction  Hypertension Interventions:  (Status:  New goal.) Long Term Goal Last practice recorded BP readings:  BP Readings from Last 3 Encounters:  01/23/21 140/80  01/12/21 (!) 194/84  08/17/19 140/69  Most recent eGFR/CrCl: No results found for: EGFR  No components found for: CRCL  Evaluation of current treatment plan related to hypertension self management and patient's adherence to plan as established by provider Provided education to patient re: stroke prevention, s/s of heart attack and stroke Reviewed medications with patient and discussed importance of compliance Discussed plans with patient for ongoing care management follow up and provided patient with direct contact information for care management team Advised patient, providing education and rationale, to monitor blood pressure daily and record, calling PCP for findings outside established parameters Provided education on prescribed diet low sodium  Dementia:  (Status:  New goal.)  Long Term Goal Evaluation of current treatment plan related to misuse of: with behavioral disturbance Made referral to LCSW for caregiver stress and level of care issues, Care guide referral made for personal care services and Medicaid resources, Reviewed medications including Aricept , Depression screen completed, Emotional Support Provided to patient/caregiver, Consideration of in-home help encouraged , Referred to LCSW for assistance with Guardianship discussion, Discussed importance of discussing diagnosis with provider, Discussed importance of attendance to all provider appointments, and Reviewed importance of home safety and to consider putting alarms on doors if pt wonders at night  Patient Goals/Self-Care Activities: Take all medications as prescribed Attend all scheduled provider appointments Call provider office for new concerns or questions   check blood pressure 3 times per week choose a place to take my blood pressure (home, clinic or office, retail store) take blood pressure log to all doctor appointments eat more whole grains, fruits and vegetables, lean meats and healthy fats limit salt intake to $RemoveB'2300mg'pyErBsko$ /day call for medicine refill 2 or 3 days before it runs out take all medications exactly as prescribed call doctor with any symptoms you believe are related to your medicine  Follow Up Plan:  Telephone follow up appointment with care management team member scheduled for:  02/05/21 The patient has been provided with contact information for the care management team and has been advised to call with any health related questions or concerns.       Consent to CCM Services: Ms. Stryker was given information about Chronic Care Management services including:  CCM service includes personalized support from designated clinical staff supervised by her physician, including individualized plan of care and coordination with other care providers 24/7 contact phone numbers for assistance for urgent and routine care needs. Service will only be billed when office clinical staff spend 20 minutes or more in a month to coordinate care. Only one practitioner may furnish and bill the service in a calendar month. The patient may stop CCM services at any time (effective at the end of the month) by phone call to the office staff. The  patient will be responsible for cost sharing (co-pay) of up to 20% of the service fee (after annual deductible is met).  Patient agreed to services and verbal consent obtained.   The patient verbalized understanding of instructions, educational materials, and care plan provided today and agreed to receive a mailed copy of patient instructions, educational materials, and care plan.   Telephone follow up appointment with care management team member scheduled for: 02/05/21 at [place] RN, [facility], CDE Care  Management Coordinator [facility] [phone], [place] [phone]

## 2021-01-28 ENCOUNTER — Other Ambulatory Visit: Payer: Self-pay | Admitting: Family Medicine

## 2021-01-28 MED ORDER — POTASSIUM CHLORIDE CRYS ER 10 MEQ PO TBCR: 10.0000 meq | EXTENDED_RELEASE_TABLET | Freq: Every day | ORAL | 1 refills | Status: DC

## 2021-01-29 ENCOUNTER — Telehealth: Payer: Self-pay | Admitting: Family Medicine

## 2021-01-29 ENCOUNTER — Ambulatory Visit (HOSPITAL_COMMUNITY): Payer: PPO

## 2021-01-29 NOTE — Telephone Encounter (Signed)
Pt daughter call and stated pt went to er because of a fall and want told she have a Vit-D deficiency and want dr.Koberlein to know .

## 2021-01-30 NOTE — Telephone Encounter (Signed)
Spoke with the patient's daughter and informed her of the message below.  Ms Unk Lightning stated she got the papers mixed up from the previous ER visit and AVS from the last visit with PCP which stated this information.  She stated Dr Ethlyn Gallery saw this paper during the patient's last visit also.  Message sent to PCP.

## 2021-01-30 NOTE — Telephone Encounter (Signed)
Please see if we can find out what ER and get records. I don't see note in her chart. And we just checked her vitamin d level last week and it was not deficient (was in low normal range, so maybe just a subtle difference from lab to lab, but I can review/advise once I get ER records)

## 2021-02-05 ENCOUNTER — Ambulatory Visit: Payer: PPO

## 2021-02-05 DIAGNOSIS — I10 Essential (primary) hypertension: Secondary | ICD-10-CM

## 2021-02-05 DIAGNOSIS — R413 Other amnesia: Secondary | ICD-10-CM

## 2021-02-05 DIAGNOSIS — E782 Mixed hyperlipidemia: Secondary | ICD-10-CM

## 2021-02-05 NOTE — Chronic Care Management (AMB) (Signed)
Chronic Care Management   CCM RN Visit Note  02/05/2021 Name: Rhonda Weaver MRN: [id number] DOB: [birth year]  Subjective: Rhonda Weaver is a 87 y.o. year old female who is a primary care patient of Koberlein, Paris Lore, MD. The care management team was consulted for assistance with disease management and care coordination needs.    Engaged with patient by telephone for follow up visit in response to provider referral for case management and/or care coordination services.   Consent to Services:  The patient was given information about Chronic Care Management services, agreed to services, and gave verbal consent prior to initiation of services.  Please see initial visit note for detailed documentation.   Patient agreed to services and verbal consent obtained.   Assessment: Review of patient past medical history, allergies, medications, health status, including review of consultants reports, laboratory and other test data, was performed as part of comprehensive evaluation and provision of chronic care management services.   SDOH (Social Determinants of Health) assessments and interventions performed:    CCM Care Plan  Allergies  Allergen Reactions   Fenofibrate     REACTION: pt states intolerant   Gemfibrozil     REACTION: pt states intolerant   Niacin     REACTION: pt states intolerant   Penicillins     REACTION: Hx of arm swelling \\T \ itching after shot   Sulfamethoxazole-Trimethoprim     REACTION: "deathly sick"  w/  N\T\V   Sulfonamide Derivatives     REACTION: nausea and vomiting    Outpatient Encounter Medications as of 02/05/2021  Medication Sig   acetaminophen (TYLENOL) 500 MG tablet Take 500 mg by mouth every 6 (six) hours as needed.   amLODipine (NORVASC) 5 MG tablet Take 1 tablet (5 mg total) by mouth daily.   atenolol (TENORMIN) 50 MG tablet Take 1 tablet (50 mg total) by mouth daily.   atorvastatin (LIPITOR) 20 MG tablet Take 1 tablet by mouth once daily    Bismuth Subsalicylate (PEPTO-BISMOL PO) Take by mouth.   donepezil (ARICEPT) 5 MG tablet Take 1 tablet (5 mg total) by mouth at bedtime.   fluticasone (FLONASE) 50 MCG/ACT nasal spray USE TWO SPRAY(S) IN EACH NOSTRIL ONCE DAILY   fluticasone (FLOVENT HFA) 44 MCG/ACT inhaler Inhale 2 puffs into the lungs as needed.   losartan-hydrochlorothiazide (HYZAAR) 100-12.5 MG tablet Take 1 tablet by mouth once daily   Magnesium Hydroxide (MILK OF MAGNESIA PO) Take by mouth.   meclizine (ANTIVERT) 25 MG tablet Take 1 tablet (25 mg total) by mouth 3 (three) times daily as needed for dizziness.   potassium chloride (KLOR-CON M) 10 MEQ tablet Take 1 tablet (10 mEq total) by mouth daily.   No facility-administered encounter medications on file as of 02/05/2021.    Patient Active Problem List   Diagnosis Date Noted   Allergic rhinitis 09/28/2007   Osteoarthritis 09/28/2007   HYPERLIPIDEMIA, MIXED 10/15/2006   Essential hypertension 10/15/2006   GERD 10/15/2006    Conditions to be addressed/monitored:HTN, HLD, and Dementia  Care Plan : RN Care Manager Plan of Care  Updates made by Yetta Glassman, RN since 02/05/2021 12:00 AM     Problem: Chronic Disease Management and Care Coordination Needs (Dementia,HTN, osteoarthritis and HLD)   Priority: High     Long-Range Goal: Establish Plan of Care for Chronic Disease Management Needs (Dementia,HTN, osteoarthritis and HLD)   Start Date: 01/25/2021  Expected End Date: 07/24/2021  Priority: High  Note:   Current Barriers:  Knowledge Deficits related to plan of care for management of CAD, HLD, Dementia, and Osteoarthritis  Care Coordination needs related to Level of care concerns, ADL IADL limitations, Memory Deficits, Inability to perform ADL's independently, Inability to perform IADL's independently, and Lacks knowledge of community resource: personal care services Chronic Disease Management support and education needs related to HTN, HLD, Dementia,  and Osteoarthritis  Spoke with daughter [name] pt gives consent to speak with her.  States that pt is getting settled in her house more and things are a little easier. States that pt still confused in the afternoon and night.   States she is now sure pt is bathing but she dresses herself.  States pt does not know her later in the day and she is restless and roams around the house.  Daughter states it is stressful but she is learning to handle the stress.  States she has alarms on the doors so she knows if she tries to go outside  States she is giving pt her medications and making sure she is eating and drinking enough fluids. States her B/P is usually around 129-143/68-78.  States they have a new B/P monitor but have not used it yet  RNCM Clinical Goal(s):  Patient will verbalize understanding of plan for management of HTN, HLD, Dementia, and Osteoarthritis as evidenced by voiced adherence to plan of care verbalize basic understanding of  HTN, HLD, Dementia, and Osteoarthritis disease process and self health management plan as evidenced by voiced understanding and teach back take all medications exactly as prescribed and will call provider for medication related questions as evidenced by dispense report and pt verbalization attend all scheduled medical appointments: Dr. Ethlyn Gallery 03/08/21,CCM PharmD 06/04/21 as evidenced by medical records demonstrate Improved adherence to prescribed treatment plan for HTN, HLD, Dementia, and Osteoarthritis as evidenced by readings within limits, voiced adherence to plan of care continue to work with RN Care Manager to address care management and care coordination needs related to  HTN, HLD, Dementia, and Osteoarthritis as evidenced by adherence to CM Team Scheduled appointments work with Education officer, museum to address  related to the management of Level of care concerns, ADL IADL limitations, Memory Deficits, Inability to perform ADL's independently, Inability to perform  IADL's independently, and Lacks knowledge of community resource: caregiver stress related to the management of HTN, HLD, Dementia, and Osteoarthritis as evidenced by review of EMR and patient or Education officer, museum report work with Data processing manager care guide to address needs related to  ADL IADL limitations, Memory Deficits, Inability to perform ADL's independently, Inability to perform IADL's independently, and Lacks knowledge of community resource: personal care services as evidenced by patient and/or community resource care guide support through collaboration with Consulting civil engineer, provider, and care team.   Interventions: 1:1 collaboration with primary care provider regarding development and update of comprehensive plan of care as evidenced by provider attestation and co-signature Inter-disciplinary care team collaboration (see longitudinal plan of care) Evaluation of current treatment plan related to  self management and patient's adherence to plan as established by provider   Hyperlipidemia Interventions:  (Status:  Goal on track:  Yes.) Long Term Goal Medication review performed; medication list updated in electronic medical record.  Provider established cholesterol goals reviewed Counseled on importance of regular laboratory monitoring as prescribed Reviewed role and benefits of statin for ASCVD risk reduction  Hypertension Interventions:  (Status:  Goal on track:  Yes.) Long Term Goal Last practice recorded BP readings:  BP Readings from Last  3 Encounters:  01/23/21 140/80  01/12/21 (!) 194/84  08/17/19 140/69  Most recent eGFR/CrCl: No results found for: EGFR  No components found for: CRCL  Evaluation of current treatment plan related to hypertension self management and patient's adherence to plan as established by provider Provided education to patient re: stroke prevention, s/s of heart attack and stroke Reviewed medications with patient and discussed importance of  compliance Discussed plans with patient for ongoing care management follow up and provided patient with direct contact information for care management team Advised patient, providing education and rationale, to monitor blood pressure daily and record, calling PCP for findings outside established parameters Provided education on prescribed diet low sodium Reviewed goals for B/P and to cal provider if pt has lower readings with symptoms   Dementia:  (Status:  Goal on track:  Yes.)  Long Term Goal Evaluation of current treatment plan related to misuse of: with behavioral disturbance Reviewed medications including Aricept , Emotional Support Provided to patient/caregiver, Consideration of in-home help encouraged , Referred to LCSW for assistance with Guardianship discussion, Discussed importance of attendance to all provider appointments, and Reviewed importance of pt drinking adequate amounts of fluids and taking medications as directed, Discussed with caregiver ways to handle stress and care of pt  Patient Goals/Self-Care Activities: Take all medications as prescribed Attend all scheduled provider appointments Call provider office for new concerns or questions  check blood pressure 3 times per week choose a place to take my blood pressure (home, clinic or office, retail store) take blood pressure log to all doctor appointments eat more whole grains, fruits and vegetables, lean meats and healthy fats limit salt intake to $RemoveB'2300mg'SyXlKkUg$ /day call for medicine refill 2 or 3 days before it runs out take all medications exactly as prescribed call doctor with any symptoms you believe are related to your medicine  Follow Up Plan:  Telephone follow up appointment with care management team member scheduled for:  03/15/21 The patient has been provided with contact information for the care management team and has been advised to call with any health related questions or concerns.       Plan:Telephone follow up  appointment with care management team member scheduled for:  03/15/21 The patient has been provided with contact information for the care management team and has been advised to call with any health related questions or concerns.  Peter Garter RN, Jackquline Denmark, CDE Care Management Coordinator [facility] [phone], Mobile [phone]

## 2021-02-05 NOTE — Patient Instructions (Signed)
Visit Information  Thank you for taking time to visit with me today. Please don't hesitate to contact me if I can be of assistance to you before our next scheduled telephone appointment.  Following are the goals we discussed today:  Take all medications as prescribed Attend all scheduled provider appointments Call provider office for new concerns or questions  check blood pressure 3 times per week choose a place to take my blood pressure (home, clinic or office, retail store) take blood pressure log to all doctor appointments eat more whole grains, fruits and vegetables, lean meats and healthy fats limit salt intake to 2300mg /day call for medicine refill 2 or 3 days before it runs out take all medications exactly as prescribed call doctor with any symptoms you believe are related to your medicine  Our next appointment is by telephone on 03/15/21 at 10 AM  Please call the care guide team at [phone] if you need to cancel or reschedule your appointment.   If you are experiencing a Mental Health or [place] or need someone to talk to, please call the Suicide and Crisis Lifeline: 988 call the [place] National Suicide Prevention Lifeline: (804)502-3903 or TTY: (339) 221-7275 TTY 6508787053) to talk to a trained counselor call 1-800-273-TALK (toll free, 24 hour hotline) go to [facility] [place] [phone]) call 911   The patient verbalized understanding of instructions, educational materials, and care plan provided today and declined offer to receive copy of patient instructions, educational materials, and care plan.  Peter Garter RN, Jackquline Denmark, CDE Care Management Coordinator [facility] [phone], Mobile [phone]

## 2021-02-06 NOTE — Progress Notes (Signed)
Scheduled 03/07/21

## 2021-02-09 DIAGNOSIS — I10 Essential (primary) hypertension: Secondary | ICD-10-CM | POA: Diagnosis not present

## 2021-02-09 DIAGNOSIS — E782 Mixed hyperlipidemia: Secondary | ICD-10-CM

## 2021-02-13 ENCOUNTER — Ambulatory Visit (INDEPENDENT_AMBULATORY_CARE_PROVIDER_SITE_OTHER): Payer: PPO

## 2021-02-13 VITALS — Ht 61.0 in | Wt 113.0 lb

## 2021-02-13 DIAGNOSIS — Z0000 Encounter for general adult medical examination without abnormal findings: Secondary | ICD-10-CM | POA: Diagnosis not present

## 2021-02-13 NOTE — Progress Notes (Signed)
Subjective:   Rhonda Weaver is a 88 y.o. female who presents for Medicare Annual (Subsequent) preventive examination.  Review of Systems    No ROS      Objective:    Today's Vitals   02/13/21 1119  Weight: 113 lb (51.3 kg)  Height: 5\' 1"  (1.549 m)   Body mass index is 21.35 kg/m.  Advanced Directives 02/13/2021 01/25/2021 01/12/2021 04/07/2017 12/06/2013 12/06/2013  Does Patient Have a Medical Advance Directive? Yes Yes No Yes Yes Yes  Type of Paramedic of [place];Living will [place];Living will - - Living will Living will  Does patient want to make changes to medical advance directive? No - Patient declined No - Patient declined - - - -  Copy of Scissors in Chart? No - copy requested No - copy requested - - No - copy requested No - copy requested  Would patient like information on creating a medical advance directive? - - Yes (ED - Information included in AVS) - - -    Current Medications (verified) Outpatient Encounter Medications as of 02/13/2021  Medication Sig   acetaminophen (TYLENOL) 500 MG tablet Take 500 mg by mouth every 6 (six) hours as needed.   amLODipine (NORVASC) 5 MG tablet Take 1 tablet (5 mg total) by mouth daily.   atenolol (TENORMIN) 50 MG tablet Take 1 tablet (50 mg total) by mouth daily.   atorvastatin (LIPITOR) 20 MG tablet Take 1 tablet by mouth once daily   Bismuth Subsalicylate (PEPTO-BISMOL PO) Take by mouth.   donepezil (ARICEPT) 5 MG tablet Take 1 tablet (5 mg total) by mouth at bedtime.   fluticasone (FLONASE) 50 MCG/ACT nasal spray USE TWO SPRAY(S) IN EACH NOSTRIL ONCE DAILY   fluticasone (FLOVENT HFA) 44 MCG/ACT inhaler Inhale 2 puffs into the lungs as needed.   losartan-hydrochlorothiazide (HYZAAR) 100-12.5 MG tablet Take 1 tablet by mouth once daily   Magnesium Hydroxide (MILK OF MAGNESIA PO) Take by mouth.   meclizine (ANTIVERT) 25 MG tablet Take 1 tablet (25 mg total) by mouth  3 (three) times daily as needed for dizziness.   potassium chloride (KLOR-CON M) 10 MEQ tablet Take 1 tablet (10 mEq total) by mouth daily.   No facility-administered encounter medications on file as of 02/13/2021.    Allergies (verified) Fenofibrate, Gemfibrozil, Niacin, Penicillins, Sulfamethoxazole-trimethoprim, and Sulfonamide derivatives   History: Past Medical History:  Diagnosis Date   Allergy    Cataract    Chronic bronchitis ([place]) 10/15/2006   Qualifier: Diagnosis of  By: Lenna Gilford MD, Deborra Medina    COLONIC POLYPS, ADENOMATOUS 10/15/2006   Qualifier: Diagnosis of  By: Lenna Gilford MD, Deborra Medina    DEGENERATIVE JOINT DISEASE 09/28/2007   Qualifier: Diagnosis of  By: Lenna Gilford MD, Deborra Medina    DIVERTICULOSIS OF COLON 09/28/2007   Qualifier: Diagnosis of  By: Lenna Gilford MD, Deborra Medina    GERD (gastroesophageal reflux disease)    HEMORRHOIDS, INTERNAL 10/15/2006   Qualifier: Diagnosis of  By: Lenna Gilford MD, Deborra Medina    Hyperlipidemia    Hypertension    Irritable bowel syndrome 10/15/2006   Qualifier: Diagnosis of  By: Lenna Gilford MD, Deborra Medina    Past Surgical History:  Procedure Laterality Date   COLONOSCOPY     DENTAL SURGERY     POLYPECTOMY     VAGINAL HYSTERECTOMY     Family History  Problem Relation Age of Onset   Heart disease Mother    Kidney disease Father  Social History   Socioeconomic History   Marital status: Widowed    Spouse name: Not on file   Number of children: Not on file   Years of education: Not on file   Highest education level: Not on file  Occupational History   Not on file  Tobacco Use   Smoking status: Never   Smokeless tobacco: Never  Substance and Sexual Activity   Alcohol use: No   Drug use: No   Sexual activity: Not on file  Other Topics Concern   Not on file  Social History Narrative   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       Home Situation: lives alone      Spiritual Beliefs: Christian      Lifestyle: stays active; diet is ok            Comptroller Strain: Low Risk    Difficulty of Paying Living Expenses: Not hard at all  Food Insecurity: No Food Insecurity   Worried About Charity fundraiser in the Last Year: Never true   Arboriculturist in the Last Year: Never true  Transportation Needs: No Transportation Needs   Lack of Transportation (Medical): No   Lack of Transportation (Non-Medical): No  Physical Activity: Sufficiently Active   Days of Exercise per Week: 5 days   Minutes of Exercise per Session: 30 min  Stress: No Stress Concern Present   Feeling of Stress : Not at all  Social Connections: Socially Isolated   Frequency of Communication with Friends and Family: More than three times a week   Frequency of Social Gatherings with Friends and Family: More than three times a week   Attends Religious Services: Never   Marine scientist or Organizations: No   Attends Archivist Meetings: Never   Marital Status: Widowed   Clinical Intake:  Pre-visit preparation completed: Yes Diabetic? No  Interpreter Needed?: NoActivities of Daily Living In your present state of health, do you have any difficulty performing the following activities: 02/13/2021  Hearing? N  Vision? N  Difficulty concentrating or making decisions? N  Walking or climbing stairs? N  Dressing or bathing? N  Doing errands, shopping? N  Preparing Food and eating ? N  Using the Toilet? N  In the past six months, have you accidently leaked urine? N  Do you have problems with loss of bowel control? N  Managing your Medications? N  Managing your Finances? N  Housekeeping or managing your Housekeeping? N  Some recent data might be hidden    Patient Care Team: Caren Macadam, MD as PCP - General (Family Medicine) Viona Gilmore, [facility] as Pharmacist (Pharmacist) Dimitri Ped, RN as Case Manager [place], Maree Erie, LCSW as Social Worker (Licensed Clinical Social Worker)  Indicate any recent  [employer] you may have received from other than [facility] providers in the past year (date may be approximate).     Assessment:   This is a routine wellness examination for Rhonda Weaver.  Virtual Visit via Telephone Note  I connected with  Rhonda Weaver on 02/13/21 at 11:15 AM EST by telephone and verified that I am speaking with the correct person using two identifiers.  Location: Patient: Home Provider: Office Persons participating in the virtual visit: patient/Nurse Health Advisor   I discussed the limitations, risks, security and privacy concerns of performing an evaluation and management service by telephone  and the availability of in person appointments. The patient expressed understanding and agreed to proceed.  Interactive audio and video telecommunications were attempted between this nurse and patient, however failed, due to patient having technical difficulties OR patient did not have access to video capability.  We continued and completed visit with audio only.  Some vital signs may be absent or patient reported.   Criselda Peaches, LPN   Hearing/Vision screen Hearing Screening - Comments:: No difficulty hearing Vision Screening - Comments:: Wears glasses. Followed by Dr Prudencio Burly  Dietary issues and exercise activities discussed: Current Exercise Habits: Home exercise routine, Type of exercise: walking, Time (Minutes): 30, Frequency (Times/Week): 5, Weekly Exercise (Minutes/Week): 150, Intensity: Mild   Goals Addressed             This Visit's Progress    Patient Stated       Walk more and socialize with nice people.        Depression Screen PHQ 2/9 Scores 02/13/2021 01/25/2021 01/23/2021 08/17/2019 04/07/2017 07/29/2016 02/14/2015  PHQ - 2 Score 0 0 0 0 0 0 0  PHQ- 9 Score 0 - 6 - - - -    Fall Risk Fall Risk  02/13/2021 01/25/2021 01/23/2021 09/13/2018 04/07/2017  Falls in the past year? 0 1 1 (No Data) No  Comment - - - Emmi Telephone Survey: data to providers prior  to load -  Number falls in past yr: 0 0 0 (No Data) -  Comment - - - Emmi Telephone Survey Actual Response =  -  Injury with Fall? 0 1 1 - -  Risk for fall due to : - History of fall(s);Impaired balance/gait;Mental status change - - -  Follow up - Education provided;Falls prevention discussed - - -    FALL RISK PREVENTION PERTAINING TO THE HOME:  Any stairs in or around the home? Yes  If so, are there any without handrails? No  Home free of loose throw rugs in walkways, pet beds, electrical cords, etc? Yes  Adequate lighting in your home to reduce risk of falls? Yes   ASSISTIVE DEVICES UTILIZED TO PREVENT FALLS:  Life alert? No  Use of a cane, walker or w/c? No  Grab bars in the bathroom? Yes  Shower chair or bench in shower? Yes  Elevated toilet seat or a handicapped toilet? Yes   TIMED UP AND GO:  Was the test performed? No . Audio Visit  Cognitive Function: MMSE - Mini Mental State Exam 04/07/2017  Orientation to time 1  Orientation to Place 5  Registration 3  Attention/ Calculation 1  Recall 0  Language- name 2 objects 2  Language- repeat 1  Language- follow 3 step command 3  Language- read & follow direction 1  Write a sentence 1  Copy design 1  Total score 19     6CIT Screen 02/13/2021  What Year? 4 points  What month? 0 points  What time? 0 points  Count back from 20 0 points  Months in reverse 2 points  Repeat phrase 2 points  Total Score 8    Immunizations Immunization History  Administered Date(s) Administered   Influenza Split 11/11/2010, 11/21/2011, 10/11/2012   Influenza Whole 11/03/2007, 11/17/2008, 11/13/2009   Influenza, High Dose Seasonal PF 11/07/2018   Influenza-Unspecified 11/05/2013, 11/10/2016, 10/11/2017   PNEUMOCOCCAL CONJUGATE-20 01/23/2021   Pneumococcal Conjugate-13 12/06/2013   Pneumococcal Polysaccharide-23 11/10/2016   Tdap 07/01/2013     Flu Vaccine status: Due, Education has been provided regarding the importance  of this  vaccine. Advised may receive this vaccine at local pharmacy or Health Dept. Aware to provide a copy of the vaccination record if obtained from local pharmacy or Health Dept. Verbalized acceptance and understanding.   Covid-19 vaccine status: Information provided on how to obtain vaccines.   Qualifies for Shingles Vaccine? Yes   Zostavax completed No   Shingrix Completed?: No.    Education has been provided regarding the importance of this vaccine. Patient has been advised to call insurance company to determine out of pocket expense if they have not yet received this vaccine. Advised may also receive vaccine at local pharmacy or Health Dept. Verbalized acceptance and understanding.  Screening Tests Health Maintenance  Topic Date Due   COVID-19 Vaccine (1) 03/01/2021 (Originally 06/24/1931)   INFLUENZA VACCINE  05/10/2021 (Originally 09/10/2020)   Zoster Vaccines- Shingrix (1 of 2) 05/14/2021 (Originally 12/26/1982)   DEXA SCAN  02/13/2022 (Originally 12/25/1997)   TETANUS/TDAP  07/02/2023   Pneumonia Vaccine 82+ Years old  Completed   HPV VACCINES  Aged Out    Health Maintenance  There are no preventive care reminders to display for this patient.   Additional Screening:   Vision Screening: Recommended annual ophthalmology exams for early detection of glaucoma and other disorders of the eye. Is the patient up to date with their annual eye exam?  Yes  Who is the provider or what is the name of the office in which the patient attends annual eye exams? Followed by Dr Prudencio Burly  Dental Screening: Recommended annual dental exams for proper oral hygiene  Community Resource Referral / Chronic Care Management:  CRR required this visit?  No   CCM required this visit?  No      Plan:     I have personally reviewed and noted the following in the patients chart:   Medical and social history Use of alcohol, tobacco or illicit drugs  Current medications and supplements including opioid  prescriptions.  Functional ability and status Nutritional status Physical activity Advanced directives List of other physicians Hospitalizations, surgeries, and ER visits in previous 12 months Vitals Screenings to include cognitive, depression, and falls Referrals and appointments  In addition, I have reviewed and discussed with patient certain preventive protocols, quality metrics, and best practice recommendations. A written personalized care plan for preventive services as well as general preventive health recommendations were provided to patient.     Criselda Peaches, LPN   08/13/1636

## 2021-02-13 NOTE — Patient Instructions (Addendum)
[name] , Thank you for taking time to come for your Medicare Wellness Visit. I appreciate your ongoing commitment to your health goals. Please review the following plan we discussed and let me know if I can assist you in the future.   These are the goals we discussed:  Goals      Patient Stated     Walk more and socialize with nice people.         This is a list of the screening recommended for you and due dates:  Health Maintenance  Topic Date Due   COVID-19 Vaccine (1) 03/01/2021*   Flu Shot  05/10/2021*   Zoster (Shingles) Vaccine (1 of 2) 05/14/2021*   DEXA scan (bone density measurement)  02/13/2022*   Tetanus Vaccine  07/02/2023   Pneumonia Vaccine  Completed   HPV Vaccine  Aged Out  *Topic was postponed. The date shown is not the original due date.   Advanced directives: Yes  Conditions/risks identified: None  Next appointment: Follow up in one year for your annual wellness visit    Preventive Care 65 Years and Older, Female Preventive care refers to lifestyle choices and visits with your health care provider that can promote health and wellness. What does preventive care include? A yearly physical exam. This is also called an annual well check. Dental exams once or twice a year. Routine eye exams. Ask your health care provider how often you should have your eyes checked. Personal lifestyle choices, including: Daily care of your teeth and gums. Regular physical activity. Eating a healthy diet. Avoiding tobacco and drug use. Limiting alcohol use. Practicing safe sex. Taking low-dose aspirin every day. Taking vitamin and mineral supplements as recommended by your health care provider. What happens during an annual well check? The services and screenings done by your health care provider during your annual well check will depend on your age, overall health, lifestyle risk factors, and family history of disease. Counseling  Your health care provider may ask you  questions about your: Alcohol use. Tobacco use. Drug use. Emotional well-being. Home and relationship well-being. Sexual activity. Eating habits. History of falls. Memory and ability to understand (cognition). Work and work Statistician. Reproductive health. Screening  You may have the following tests or measurements: Height, weight, and BMI. Blood pressure. Lipid and cholesterol levels. These may be checked every 5 years, or more frequently if you are over 53 years old. Skin check. Lung cancer screening. You may have this screening every year starting at age 68 if you have a 30-pack-year history of smoking and currently smoke or have quit within the past 15 years. Fecal occult blood test (FOBT) of the stool. You may have this test every year starting at age 65. Flexible sigmoidoscopy or colonoscopy. You may have a sigmoidoscopy every 5 years or a colonoscopy every 10 years starting at age 50. Hepatitis C blood test. Hepatitis B blood test. Sexually transmitted disease (STD) testing. Diabetes screening. This is done by checking your blood sugar (glucose) after you have not eaten for a while (fasting). You may have this done every 1-3 years. Bone density scan. This is done to screen for osteoporosis. You may have this done starting at age 58. Mammogram. This may be done every 1-2 years. Talk to your health care provider about how often you should have regular mammograms. Talk with your health care provider about your test results, treatment options, and if necessary, the need for more tests. Vaccines  Your health care provider  may recommend certain vaccines, such as: Influenza vaccine. This is recommended every year. Tetanus, diphtheria, and acellular pertussis (Tdap, Td) vaccine. You may need a Td booster every 10 years. Zoster vaccine. You may need this after age 38. Pneumococcal 13-valent conjugate (PCV13) vaccine. One dose is recommended after age 48. Pneumococcal polysaccharide  (PPSV23) vaccine. One dose is recommended after age 70. Talk to your health care provider about which screenings and vaccines you need and how often you need them. This information is not intended to replace advice given to you by your health care provider. Make sure you discuss any questions you have with your health care provider. Document Released: 02/23/2015 Document Revised: 10/17/2015 Document Reviewed: 11/28/2014 Elsevier Interactive Patient Education  2017 [place] Prevention in the Home Falls can cause injuries. They can happen to people of all ages. There are many things you can do to make your home safe and to help prevent falls. What can I do on the outside of my home? Regularly fix the edges of walkways and driveways and fix any cracks. Remove anything that might make you trip as you walk through a door, such as a raised step or threshold. Trim any bushes or trees on the path to your home. Use bright outdoor lighting. Clear any walking paths of anything that might make someone trip, such as rocks or tools. Regularly check to see if handrails are loose or broken. Make sure that both sides of any steps have handrails. Any raised decks and porches should have guardrails on the edges. Have any leaves, snow, or ice cleared regularly. Use sand or salt on walking paths during winter. Clean up any spills in your garage right away. This includes oil or grease spills. What can I do in the bathroom? Use night lights. Install grab bars by the toilet and in the tub and shower. Do not use towel bars as grab bars. Use non-skid mats or decals in the tub or shower. If you need to sit down in the shower, use a plastic, non-slip stool. Keep the floor dry. Clean up any water that spills on the floor as soon as it happens. Remove soap buildup in the tub or shower regularly. Attach bath mats securely with double-sided non-slip rug tape. Do not have throw rugs and other things on the  floor that can make you trip. What can I do in the bedroom? Use night lights. Make sure that you have a light by your bed that is easy to reach. Do not use any sheets or blankets that are too big for your bed. They should not hang down onto the floor. Have a firm chair that has side arms. You can use this for support while you get dressed. Do not have throw rugs and other things on the floor that can make you trip. What can I do in the kitchen? Clean up any spills right away. Avoid walking on wet floors. Keep items that you use a lot in easy-to-reach places. If you need to reach something above you, use a strong step stool that has a grab bar. Keep electrical cords out of the way. Do not use floor polish or wax that makes floors slippery. If you must use wax, use non-skid floor wax. Do not have throw rugs and other things on the floor that can make you trip. What can I do with my stairs? Do not leave any items on the stairs. Make sure that there are handrails on both sides  of the stairs and use them. Fix handrails that are broken or loose. Make sure that handrails are as long as the stairways. Check any carpeting to make sure that it is firmly attached to the stairs. Fix any carpet that is loose or worn. Avoid having throw rugs at the top or bottom of the stairs. If you do have throw rugs, attach them to the floor with carpet tape. Make sure that you have a light switch at the top of the stairs and the bottom of the stairs. If you do not have them, ask someone to add them for you. What else can I do to help prevent falls? Wear shoes that: Do not have high heels. Have rubber bottoms. Are comfortable and fit you well. Are closed at the toe. Do not wear sandals. If you use a stepladder: Make sure that it is fully opened. Do not climb a closed stepladder. Make sure that both sides of the stepladder are locked into place. Ask someone to hold it for you, if possible. Clearly mark and make  sure that you can see: Any grab bars or handrails. First and last steps. Where the edge of each step is. Use tools that help you move around (mobility aids) if they are needed. These include: Canes. Walkers. Scooters. Crutches. Turn on the lights when you go into a dark area. Replace any light bulbs as soon as they burn out. Set up your furniture so you have a clear path. Avoid moving your furniture around. If any of your floors are uneven, fix them. If there are any pets around you, be aware of where they are. Review your medicines with your doctor. Some medicines can make you feel dizzy. This can increase your chance of falling. Ask your doctor what other things that you can do to help prevent falls. This information is not intended to replace advice given to you by your health care provider. Make sure you discuss any questions you have with your health care provider. Document Released: 11/23/2008 Document Revised: 07/05/2015 Document Reviewed: 03/03/2014 Elsevier Interactive Patient Education  2017 [employer].

## 2021-02-18 ENCOUNTER — Other Ambulatory Visit: Payer: Self-pay

## 2021-02-18 ENCOUNTER — Ambulatory Visit (HOSPITAL_COMMUNITY): Payer: PPO | Attending: Cardiovascular Disease

## 2021-02-18 DIAGNOSIS — R011 Cardiac murmur, unspecified: Secondary | ICD-10-CM | POA: Insufficient documentation

## 2021-02-18 LAB — ECHOCARDIOGRAM COMPLETE
Area-P 1/2: 2.52 cm2
P 1/2 time: 450 msec
S' Lateral: 2.5 cm

## 2021-02-21 ENCOUNTER — Telehealth: Payer: Self-pay

## 2021-02-21 NOTE — Telephone Encounter (Signed)
° °  Telephone encounter was:  Unsuccessful.  02/21/2021 Name: Rhonda Weaver MRN: [id number] DOB: [birth year]  Unsuccessful outbound call made today to assist with:   Medicaid  Outreach Attempt:  1st Attempt  A HIPAA compliant voice message was left requesting a return call.  Instructed patient to call back at [phone].  Tynisa Vohs, AAS Paralegal, [facility]  [address], [place] [address] ??[email or web address]   ?? [phone]   [email or web address]

## 2021-02-26 NOTE — Progress Notes (Signed)
No need to RS patient is scheduled on a [facility] day for [place].  [facility]  Direct Dial: [phone]

## 2021-02-28 ENCOUNTER — Ambulatory Visit (HOSPITAL_COMMUNITY): Payer: PPO | Attending: Cardiology

## 2021-02-28 ENCOUNTER — Other Ambulatory Visit: Payer: Self-pay

## 2021-03-07 ENCOUNTER — Telehealth: Payer: Self-pay

## 2021-03-07 ENCOUNTER — Ambulatory Visit (INDEPENDENT_AMBULATORY_CARE_PROVIDER_SITE_OTHER): Payer: PPO | Admitting: Licensed Clinical Social Worker

## 2021-03-07 DIAGNOSIS — E782 Mixed hyperlipidemia: Secondary | ICD-10-CM

## 2021-03-07 DIAGNOSIS — I10 Essential (primary) hypertension: Secondary | ICD-10-CM

## 2021-03-07 DIAGNOSIS — M1991 Primary osteoarthritis, unspecified site: Secondary | ICD-10-CM

## 2021-03-07 NOTE — Telephone Encounter (Signed)
° °  Telephone encounter was:  Unsuccessful.  03/07/2021 Name: Rhonda Weaver MRN: [id number] DOB: Jan 10, 1931  Unsuccessful outbound call made today to assist with:   Medicaid.  Outreach Attempt:  2nd Attempt  A HIPAA compliant voice message was left requesting a return call.  Instructed patient to call back at [phone].  Danyal Adorno, AAS Paralegal, [facility]  [address], [place] [address] ??[name]   ?? [phone]   [email or web address]

## 2021-03-08 ENCOUNTER — Ambulatory Visit (INDEPENDENT_AMBULATORY_CARE_PROVIDER_SITE_OTHER): Payer: PPO | Admitting: Family Medicine

## 2021-03-08 ENCOUNTER — Encounter: Payer: Self-pay | Admitting: Family Medicine

## 2021-03-08 VITALS — BP 142/70 | HR 69 | Temp 98.3°F | Ht 61.0 in | Wt 118.4 lb

## 2021-03-08 DIAGNOSIS — K30 Functional dyspepsia: Secondary | ICD-10-CM | POA: Diagnosis not present

## 2021-03-08 DIAGNOSIS — R8290 Unspecified abnormal findings in urine: Secondary | ICD-10-CM

## 2021-03-08 DIAGNOSIS — E041 Nontoxic single thyroid nodule: Secondary | ICD-10-CM | POA: Diagnosis not present

## 2021-03-08 DIAGNOSIS — R911 Solitary pulmonary nodule: Secondary | ICD-10-CM | POA: Diagnosis not present

## 2021-03-08 DIAGNOSIS — R413 Other amnesia: Secondary | ICD-10-CM | POA: Diagnosis not present

## 2021-03-08 DIAGNOSIS — R300 Dysuria: Secondary | ICD-10-CM

## 2021-03-08 MED ORDER — FAMOTIDINE 20 MG PO TABS: 20.0000 mg | ORAL_TABLET | Freq: Two times a day (BID) | ORAL | 0 refills | Status: DC

## 2021-03-08 NOTE — Progress Notes (Signed)
Rhonda Weaver DOB: 01-Jun-1930 Encounter date: 03/08/2021  This is a 88 y.o. female who presents with Chief Complaint  Patient presents with   Follow-up    History of present illness: Last visit was 01/23/2021.  She had followed up at that visit with her daughter after a fall.  We discussed multiple issues at that visit which she had follow-up or needed follow-up on:  Thyroid nodule incidentally found on imaging in the emergency room: Thyroid ultrasound not completed Lung nodules also incidentally noted, CT for follow-up ordered: CT chest not completed Heart murmur, worsening:  1. Left ventricular ejection fraction, by estimation, is 55 to 60%. The  left ventricle has normal function. The left ventricle has no regional  wall motion abnormalities. There is severe concentric left ventricular  hypertrophy. Left ventricular diastolic   parameters are consistent with Grade II diastolic dysfunction  (pseudonormalization).   2. Right ventricular systolic function is normal. The right ventricular  size is normal. Tricuspid regurgitation signal is inadequate for assessing  PA pressure.   3. Left atrial size was mildly dilated.   4. A small pericardial effusion is present. The pericardial effusion is  circumferential. There is no evidence of cardiac tamponade.   5. The mitral valve is grossly normal. Mild to moderate mitral valve  regurgitation. No evidence of mitral stenosis.   6. The aortic valve is tricuspid. There is moderate calcification of the  aortic valve. There is moderate thickening of the aortic valve. Aortic  valve regurgitation is mild. Suboptimally imaged for assessment of  gradient.   7. The inferior vena cava is normal in size with greater than 50%  respiratory variability, suggesting right atrial pressure of 3 mmHg.   Comparison(s): Regurgitation is new from prior.   Memory deficit: Started Aricept, but patient has not been taking.  She does not like taking  medications at night and so has refused.  Having more stomach discomfort - feeling upset, gassy. Taking pepto, but then having darker stools. States that she has bowel movement daily. No loose stools. Daughter states she says she feels sick, but she denies nausea in the office.   Getting up in middle of night - eating snacks; hard time settling down.  Daughter states that she gets somewhat agitated.  Intermittent confusion during the day seems to be worsening.  At night, she seems to be worse.  She frequently reverts back to historical moments (like when she was working).    Allergies  Allergen Reactions   Fenofibrate     REACTION: pt states intolerant   Gemfibrozil     REACTION: pt states intolerant   Niacin     REACTION: pt states intolerant   Penicillins     REACTION: Hx of arm swelling \\T \ itching after shot   Sulfamethoxazole-Trimethoprim     REACTION: "deathly sick"  w/  N\T\V   Sulfonamide Derivatives     REACTION: nausea and vomiting   Current Meds  Medication Sig   acetaminophen (TYLENOL) 500 MG tablet Take 500 mg by mouth every 6 (six) hours as needed.   amLODipine (NORVASC) 5 MG tablet Take 1 tablet (5 mg total) by mouth daily.   atenolol (TENORMIN) 50 MG tablet Take 1 tablet (50 mg total) by mouth daily.   atorvastatin (LIPITOR) 20 MG tablet Take 1 tablet by mouth once daily   donepezil (ARICEPT) 5 MG tablet Take 1 tablet (5 mg total) by mouth at bedtime.   famotidine (PEPCID) 20 MG tablet Take 1 tablet (  20 mg total) by mouth 2 (two) times daily.   fluticasone (FLONASE) 50 MCG/ACT nasal spray USE TWO SPRAY(S) IN EACH NOSTRIL ONCE DAILY   fluticasone (FLOVENT HFA) 44 MCG/ACT inhaler Inhale 2 puffs into the lungs as needed.   losartan-hydrochlorothiazide (HYZAAR) 100-12.5 MG tablet Take 1 tablet by mouth once daily   meclizine (ANTIVERT) 25 MG tablet Take 1 tablet (25 mg total) by mouth 3 (three) times daily as needed for dizziness.   potassium chloride (KLOR-CON M) 10  MEQ tablet Take 1 tablet (10 mEq total) by mouth daily.   [DISCONTINUED] Bismuth Subsalicylate (PEPTO-BISMOL PO) Take by mouth.   [DISCONTINUED] Magnesium Hydroxide (MILK OF MAGNESIA PO) Take by mouth.    Review of Systems  Constitutional:  Negative for chills, fatigue and fever.  Respiratory:  Negative for cough, chest tightness, shortness of breath and wheezing.   Cardiovascular:  Negative for chest pain, palpitations and leg swelling.  Gastrointestinal:  Positive for constipation (daughter states that she is not reguar with bm; patient states that she is) and nausea. Negative for abdominal distention, abdominal pain, diarrhea and vomiting.   Objective:  BP (!) 142/70 (BP Location: Right Arm, Cuff Size: Normal)    Pulse 69    Temp 98.3 F (36.8 C) (Oral)    Ht 5\' 1"  (1.549 m)    Wt 118 lb 6.4 oz (53.7 kg)    SpO2 95%    BMI 22.37 kg/m   Weight: 118 lb 6.4 oz (53.7 kg)   BP Readings from Last 3 Encounters:  03/08/21 (!) 142/70  01/23/21 140/80  01/12/21 (!) 194/84   Wt Readings from Last 3 Encounters:  03/08/21 118 lb 6.4 oz (53.7 kg)  02/13/21 113 lb (51.3 kg)  01/23/21 113 lb 4.8 oz (51.4 kg)    Physical Exam Constitutional:      General: She is not in acute distress.    Appearance: She is well-developed.  Cardiovascular:     Rate and Rhythm: Normal rate and regular rhythm.     Heart sounds: Murmur heard.  Systolic murmur is present with a grade of 2/6.    No friction rub.  Pulmonary:     Effort: Pulmonary effort is normal. No respiratory distress.     Breath sounds: Normal breath sounds. No wheezing or rales.  Abdominal:     General: Abdomen is flat. Bowel sounds are normal.     Palpations: Abdomen is soft.  Musculoskeletal:     Right lower leg: No edema.     Left lower leg: No edema.  Neurological:     Mental Status: She is alert and oriented to person, place, and time.  Psychiatric:        Behavior: Behavior normal.    Assessment/Plan  1.  Indigestion Patient is a poor historian due to her memory.  From information obtained from patient and daughter, it seems that she is having some constipation as well as indigestion.  We will try to do a fiber supplement to help with more regular bowel movements.  Hopefully this will also help with nausea.  I am going to add Pepcid for her to take.  At 1 point during talking she did mention that she is doing more burping and has had some reflux after eating.  I have asked daughter to let me know if symptoms or not improved after a month of this trial. - famotidine (PEPCID) 20 MG tablet; Take 1 tablet (20 mg total) by mouth 2 (two) times daily.  Dispense: 60 tablet; Refill: 0  2. Memory deficit We will refer to neurology for further evaluation.  Daughter would like her to see a specialist.  I did advise her to go ahead and try to take her Aricept around dinner.  Daughter will continue to monitor for mood/irritability/agitation.  We can consider further treatment pending her response.  She does seem to be having a significant decline with memory.  We discussed prior further evaluation from abnormal studies in the emergency room (thyroid nodule/pulmonary nodules).  With memory decline, we discussed it may not be prudent to follow-up on these abnormalities.  I think it is reasonable to hold off for now, let patient follow-up with neurology, and then discuss treatment goals.  Daughter has spoken with chronic care management.  She does have some concerns about decline in how to handle progression of dementia going forward.  She is grateful to have chronic care management on board from this standpoint. - Ambulatory referral to Neurology  3. Dysuria Daughter states the patient has intermittently complained of urinary discomfort.  She presently states she has no symptoms, but also is poor historian.  We will check and make sure no concern for infection. - Urinalysis with Culture Reflex  4. Pulmonary nodule See  above.  CT chest has been ordered, and daughter is willing to schedule, but we discussed consideration for neuro evaluation and treatment of memory issue is focus rather than follow-up on multiple other abnormalities.  5. Thyroid nodule See above.  discussed cardiology eval as well, but at this point, neuro evaluation and memory treatment is more pressing.  86minutes spent with patient, chart review, charting, follow up treatment plan discussion, exam.   Micheline Rough, MD

## 2021-03-08 NOTE — Patient Instructions (Signed)
Citrucel (fiber - you can get this over the counter) daily to help with regular stools. Add in pepcid.   Let me know if nausea/stomach upset is not better in 2 weeks time.

## 2021-03-08 NOTE — Patient Instructions (Signed)
Visit Information  Thank you for taking time to visit with me today. Please don't hesitate to contact me if I can be of assistance to you before our next scheduled telephone appointment.  Following are the goals we discussed today:  Patient Goals/Self-Care Activities: Over the next 120 days Attend scheduled appointments with providers Utilize healthy coping skills and/or supportive resources provided Contact PCP office with any questions or concerns  Our next appointment is by telephone on 03/22/21   Please call the care guide team at [phone] if you need to cancel or reschedule your appointment.   If you are experiencing a Mental Health or [place] or need someone to talk to, please call the Suicide and Crisis Lifeline: 988 call 911   Following is a copy of your full plan of care:  Care Plan : LCSW Plan of Care  Updates made by Rhonda Chesterfield, LCSW since 03/08/2021 12:00 AM     Problem: Quality of Life (General Plan of Care)      Long-Range Goal: Quality of Life Maintained   Start Date: 03/07/2021  This Visit's Progress: On track  Priority: High  Note:   Current Barriers:   Patient unable to consistently perform activities of daily living and needs assistance in order to meet this unmet needs Clinical Goals: Patient will work with CCM LCSW to address needs related to management of health conditions to promote well-being  Clinical Interventions: Assessment of needs, barriers , agencies contacted, as well as how stressors are negatively impacting functioning. All hx provided by patient's daughter, Rhonda Weaver Patient has resided with adult daughter for approx 2 months. She has been observed to be very active at night and continues to endorse memory concerns throughout the day, often forgetting that she ate or taken her daily meds. She has brief episodes of sadness related to the recent loss of a close friend Patient has been observed to have balance concerns;  however, she is not willing to utilize a cane to promote balance. No recent falls Patient receives support from family. She is not currently open to aid services CCM LCSW discussed various options to promote safety and strengthen support system. The process regarding placement and/or obtaining in home aids discussed. LCSW will mail supportive documentation to address on file Family were provided education on Personal Care Service process from RN CM, per review of chart. Patient would need Medicaid and will be contacted by Care Guide to assist with application for PCS services Review various resources, discussed options and provided patient information about {CCM/Resource options:25377 Active listening / Reflection utilized  Emotional Support Provided Provided psychoeducation for mental health needs  Reviewed mental health medications and discussed importance of compliance: Patient refuses to take Aricept at night, as prescribed. Daughter will address with PCP at upcoming PCP appt scheduled for 03/08/21 Caregiver stress acknowledged  Consideration of in-home help encouraged : options discussed Verbalization of feelings encouraged  Discussed caregiver resources and support:   1:1 collaboration with primary care provider regarding development and update of comprehensive plan of care as evidenced by provider attestation and co-signature Inter-disciplinary care team collaboration (Weaver longitudinal plan of care) Patient Goals/Self-Care Activities: Over the next 120 days Attend scheduled appointments with providers Utilize healthy coping skills and/or supportive resources provided Contact PCP office with any questions or concerns      [name] was given information about Care Management services by the embedded care coordination team including:  Care Management services include personalized support from designated clinical  staff supervised by her physician, including individualized plan of care and  coordination with other care providers 24/7 contact phone numbers for assistance for urgent and routine care needs. The patient may stop CCM services at any time (effective at the end of the month) by phone call to the office staff.  Patient agreed to services and verbal consent obtained.   The patient verbalized understanding of instructions, educational materials, and care plan provided today and declined offer to receive copy of patient instructions, educational materials, and care plan.   Rhonda Weaver, MSW, [facility]   [facility] [email or web address] Phone [phone] 9:29 AM

## 2021-03-08 NOTE — Chronic Care Management (AMB) (Signed)
Chronic Care Management    Clinical Social Work Note  03/08/2021 Name: Rhonda Weaver MRN: [id number] DOB: 1930/02/15  Rhonda Weaver is a 88 y.o. year old female who is a primary care patient of Koberlein, Steele Berg, MD. The CCM team was consulted to assist the patient with chronic disease management and/or care coordination needs related to: Level of Care Concerns and Caregiver Stress.   Engaged with patient's daughter by telephone for initial visit in response to provider referral for social work chronic care management and care coordination services.   Consent to Services:  The patient was given the following information about Chronic Care Management services today, agreed to services, and gave verbal consent: 1. CCM service includes personalized support from designated clinical staff supervised by the primary care provider, including individualized plan of care and coordination with other care providers 2. 24/7 contact phone numbers for assistance for urgent and routine care needs. 3. Service will only be billed when office clinical staff spend 20 minutes or more in a month to coordinate care. 4. Only one practitioner may furnish and bill the service in a calendar month. 5.The patient may stop CCM services at any time (effective at the end of the month) by phone call to the office staff. 6. The patient will be responsible for cost sharing (co-pay) of up to 20% of the service fee (after annual deductible is met). Patient agreed to services and consent obtained.  Patient agreed to services and consent obtained.   Summary: Assessed patient's previous and current treatment, coping skills, support system and barriers to care. Patient's daughter, Rhonda Weaver, provided all information during this encounter.Patient continues to have difficulty managing memory and balance concerns. She refuses to take Aricept at night. CCM LCSW discussed various options to promote safety and strengthen support system. The  process regarding placement and/or obtaining in home aids discussed. LCSW will mail supportive documentation to address on file  See Care Plan below for interventions and patient self-care activities.  Recommendation: Patient may benefit from, and is in agreement work with LCSW to address care coordination needs and will continue to work with the clinical team to address health care and disease management related needs.   Follow up Plan: Patient would like continued follow-up from CCM LCSW.  per patient's request will follow up in 03/22/21.  Will call office if needed prior to next encounter.   SDOH (Social Determinants of Health) assessments and interventions performed:    Advanced Directives Status: Not addressed in this encounter.  CCM Care Plan  Allergies  Allergen Reactions   Fenofibrate     REACTION: pt states intolerant   Gemfibrozil     REACTION: pt states intolerant   Niacin     REACTION: pt states intolerant   Penicillins     REACTION: Hx of arm swelling$RemoveBefore' \\T'HvFYtcykwenDM$ \ itching after shot   Sulfamethoxazole-Trimethoprim     REACTION: "deathly sick"  w/  N\T\V   Sulfonamide Derivatives     REACTION: nausea and vomiting    Outpatient Encounter Medications as of 03/07/2021  Medication Sig   acetaminophen (TYLENOL) 500 MG tablet Take 500 mg by mouth every 6 (six) hours as needed.   amLODipine (NORVASC) 5 MG tablet Take 1 tablet (5 mg total) by mouth daily.   atenolol (TENORMIN) 50 MG tablet Take 1 tablet (50 mg total) by mouth daily.   atorvastatin (LIPITOR) 20 MG tablet Take 1 tablet by mouth once daily   Bismuth Subsalicylate (PEPTO-BISMOL PO) Take by mouth.  donepezil (ARICEPT) 5 MG tablet Take 1 tablet (5 mg total) by mouth at bedtime.   fluticasone (FLONASE) 50 MCG/ACT nasal spray USE TWO SPRAY(S) IN EACH NOSTRIL ONCE DAILY   fluticasone (FLOVENT HFA) 44 MCG/ACT inhaler Inhale 2 puffs into the lungs as needed.   losartan-hydrochlorothiazide (HYZAAR) 100-12.5 MG tablet Take 1  tablet by mouth once daily   Magnesium Hydroxide (MILK OF MAGNESIA PO) Take by mouth.   meclizine (ANTIVERT) 25 MG tablet Take 1 tablet (25 mg total) by mouth 3 (three) times daily as needed for dizziness.   potassium chloride (KLOR-CON M) 10 MEQ tablet Take 1 tablet (10 mEq total) by mouth daily.   No facility-administered encounter medications on file as of 03/07/2021.    Patient Active Problem List   Diagnosis Date Noted   Allergic rhinitis 09/28/2007   Osteoarthritis 09/28/2007   HYPERLIPIDEMIA, MIXED 10/15/2006   Essential hypertension 10/15/2006   GERD 10/15/2006    Conditions to be addressed/monitored: HTN and Osteoarthritis; Level of care concerns, Memory Deficits, Inability to perform ADL's independently, and Inability to perform IADL's independently  Care Plan : LCSW Plan of Care  Updates made by Rhonda Chesterfield, LCSW since 03/08/2021 12:00 AM     Problem: Quality of Life (General Plan of Care)      Long-Range Goal: Quality of Life Maintained   Start Date: 03/07/2021  This Visit's Progress: On track  Priority: High  Note:   Current Barriers:   Patient unable to consistently perform activities of daily living and needs assistance in order to meet this unmet needs Clinical Goals: Patient will work with CCM LCSW to address needs related to management of health conditions to promote well-being  Clinical Interventions: Assessment of needs, barriers , agencies contacted, as well as how stressors are negatively impacting functioning. All hx provided by patient's daughter, Rhonda Weaver Patient has resided with adult daughter for approx 2 months. She has been observed to be very active at night and continues to endorse memory concerns throughout the day, often forgetting that she ate or taken her daily meds. She has brief episodes of sadness related to the recent loss of a close friend Patient has been observed to have balance concerns; however, she is not willing to utilize a  cane to promote balance. No recent falls Patient receives support from family. She is not currently open to aid services CCM LCSW discussed various options to promote safety and strengthen support system. The process regarding placement and/or obtaining in home aids discussed. LCSW will mail supportive documentation to address on file Family were provided education on Personal Care Service process from RN CM, per review of chart. Patient would need Medicaid and will be contacted by Care Guide to assist with application for PCS services Review various resources, discussed options and provided patient information about {CCM/Resource options:25377 Active listening / Reflection utilized  Emotional Support Provided Provided psychoeducation for mental health needs  Reviewed mental health medications and discussed importance of compliance: Patient refuses to take Aricept at night, as prescribed. Daughter will address with PCP at upcoming PCP appt scheduled for 03/08/21 Caregiver stress acknowledged  Consideration of in-home help encouraged : options discussed Verbalization of feelings encouraged  Discussed caregiver resources and support:   1:1 collaboration with primary care provider regarding development and update of comprehensive plan of care as evidenced by provider attestation and co-signature Inter-disciplinary care team collaboration (see longitudinal plan of care) Patient Goals/Self-Care Activities: Over the next 120 days Attend scheduled appointments with providers  Utilize healthy coping skills and/or supportive resources provided Contact PCP office with any questions or concerns       [name], MSW, [email or web address] Phone [phone] 9:28 AM

## 2021-03-10 LAB — URINALYSIS W MICROSCOPIC + REFLEX CULTURE
Bilirubin Urine: NEGATIVE
Glucose, UA: NEGATIVE
Hgb urine dipstick: NEGATIVE
Hyaline Cast: NONE SEEN /LPF
Ketones, ur: NEGATIVE
Nitrites, Initial: POSITIVE — AB
Protein, ur: NEGATIVE
RBC / HPF: NONE SEEN /HPF (ref 0–2)
Specific Gravity, Urine: 1.014 (ref 1.001–1.035)
Squamous Epithelial / HPF: NONE SEEN /HPF (ref <=5)
pH: 5.5 (ref 5.0–8.0)

## 2021-03-10 LAB — CULTURE INDICATED

## 2021-03-10 LAB — URINE CULTURE
MICRO NUMBER:: 12932927
SPECIMEN QUALITY:: ADEQUATE

## 2021-03-11 ENCOUNTER — Telehealth: Payer: Self-pay | Admitting: *Deleted

## 2021-03-11 ENCOUNTER — Encounter: Payer: Self-pay | Admitting: Physician Assistant

## 2021-03-11 ENCOUNTER — Other Ambulatory Visit: Payer: Self-pay | Admitting: Family Medicine

## 2021-03-11 MED ORDER — NITROFURANTOIN MONOHYD MACRO 100 MG PO CAPS: 100.0000 mg | ORAL_CAPSULE | Freq: Two times a day (BID) | ORAL | 0 refills | Status: DC

## 2021-03-11 NOTE — Telephone Encounter (Signed)
-----   Message from Caren Macadam, MD sent at 03/09/2021  9:00 AM EST ----- Daughter was asking about a contact that jasmine had mentioned. I believe it was to help with insurance/med coverage and this is the callback info:  [facility]  [address] [place], [place] [address] [phone]

## 2021-03-11 NOTE — Telephone Encounter (Signed)
Spoke with the patient's daughter Mrs Unk Lightning and informed her of the information as below.

## 2021-03-12 ENCOUNTER — Telehealth: Payer: Self-pay

## 2021-03-12 DIAGNOSIS — M1991 Primary osteoarthritis, unspecified site: Secondary | ICD-10-CM

## 2021-03-12 DIAGNOSIS — I10 Essential (primary) hypertension: Secondary | ICD-10-CM

## 2021-03-12 DIAGNOSIS — E782 Mixed hyperlipidemia: Secondary | ICD-10-CM | POA: Diagnosis not present

## 2021-03-12 NOTE — Telephone Encounter (Signed)
° °  Telephone encounter was:  Successful.  03/12/2021 Name: Rhonda Weaver MRN: [id number] DOB: 1930-10-19  Rhonda Weaver is a 88 y.o. year old female who is a primary care patient of Koberlein, Steele Berg, MD . The community resource team was consulted for assistance with  Medicaid and in-home care.  Care guide performed the following interventions: Spoke with patient's daughter [name]. She has decided not to apply for Medicaid at this time and is not ready to bring in in-home aide at this time.  Leatha Gilding the number for [employer] and [facility] for future reference.    Follow Up Plan:  No further follow up planned at this time. The patient has been provided with needed resources.  Jeremie Abdelaziz, AAS Paralegal, [facility]  [address], [place] [address] ??[email or web address]   ?? [phone]   [email or web address]

## 2021-03-13 ENCOUNTER — Telehealth: Payer: Self-pay | Admitting: Pharmacist

## 2021-03-13 NOTE — Chronic Care Management (AMB) (Signed)
Chronic Care Management Pharmacy Assistant   Name: Rhonda Weaver  MRN: [id number] DOB: 1930-09-03  Reason for Encounter: Disease State / Hypertension Assessment Call   Conditions to be addressed/monitored: HTN   Recent office visits:  03/08/2021 Micheline Rough MD - Patient was seen for indigestion and additional issues. Started Famotidine 20 mg twice daily. Discontinued PeptoBismol and Magnesium. Follow up if symptoms worsen or fail to improve.   02/13/2021 Rolene Arbour LPN - Medicare annual wellness exam  01/23/2021 Micheline Rough MD - Patient was seen for thyroid nodule and additional issues. Started Donepezil and discontinued Aspirin. No follow up noted.  Recent consult visits:  None  Hospital visits:  None  Medications: Outpatient Encounter Medications as of 03/13/2021  Medication Sig   acetaminophen (TYLENOL) 500 MG tablet Take 500 mg by mouth every 6 (six) hours as needed.   amLODipine (NORVASC) 5 MG tablet Take 1 tablet (5 mg total) by mouth daily.   atenolol (TENORMIN) 50 MG tablet Take 1 tablet (50 mg total) by mouth daily.   atorvastatin (LIPITOR) 20 MG tablet Take 1 tablet by mouth once daily   donepezil (ARICEPT) 5 MG tablet Take 1 tablet (5 mg total) by mouth at bedtime.   famotidine (PEPCID) 20 MG tablet Take 1 tablet (20 mg total) by mouth 2 (two) times daily.   fluticasone (FLONASE) 50 MCG/ACT nasal spray USE TWO SPRAY(S) IN EACH NOSTRIL ONCE DAILY   fluticasone (FLOVENT HFA) 44 MCG/ACT inhaler Inhale 2 puffs into the lungs as needed.   losartan-hydrochlorothiazide (HYZAAR) 100-12.5 MG tablet Take 1 tablet by mouth once daily   meclizine (ANTIVERT) 25 MG tablet Take 1 tablet (25 mg total) by mouth 3 (three) times daily as needed for dizziness.   nitrofurantoin, macrocrystal-monohydrate, (MACROBID) 100 MG capsule Take 1 capsule (100 mg total) by mouth 2 (two) times daily.   potassium chloride (KLOR-CON M) 10 MEQ tablet Take 1 tablet (10 mEq total) by  mouth daily.   No facility-administered encounter medications on file as of 03/13/2021.  Fill History: ATENOLOL 50MG        TAB 01/24/2021 90   ATORVASTATIN 20MG    TAB 04/02/2020 90   DONEPEZIL HCL 5MG  TAB 01/23/2021 90   LOSARTAN/HCT 100-12.5MG  TAB 07/16/2020 90   POT CL MICRO ER10MEQTAB 01/28/2021 90   AMLODIPINE 5MG  TAB 10/10/2020 90   Reviewed chart prior to disease state call. Spoke with patient regarding BP  Recent Office Vitals: BP Readings from Last 3 Encounters:  03/08/21 (!) 142/70  01/23/21 140/80  01/12/21 (!) 194/84   Pulse Readings from Last 3 Encounters:  03/08/21 69  01/23/21 75  01/12/21 82    Wt Readings from Last 3 Encounters:  03/08/21 118 lb 6.4 oz (53.7 kg)  02/13/21 113 lb (51.3 kg)  01/23/21 113 lb 4.8 oz (51.4 kg)     Kidney Function Lab Results  Component Value Date/Time   CREATININE 0.95 01/23/2021 12:15 PM   CREATININE 1.02 (H) 01/12/2021 12:29 PM   GFR 53.66 (L) 01/23/2021 12:15 PM   GFRNONAA 53 (L) 01/12/2021 12:29 PM   GFRAA 78 03/06/2008 08:13 AM    BMP Latest Ref Rng & Units 01/23/2021 01/12/2021 01/12/2019  Glucose 70 - 99 mg/dL 109(H) 91 108(H)  BUN 6 - 23 mg/dL 17 12 10   Creatinine 0.40 - 1.20 mg/dL 0.95 1.02(H) 0.89  Sodium 135 - 145 mEq/L 139 140 140  Potassium 3.5 - 5.1 mEq/L 3.3(L) 3.6 3.8  Chloride 96 - 112 mEq/L 97 105  102  CO2 19 - 32 mEq/L 34(H) 26 30  Calcium 8.4 - 10.5 mg/dL 10.1 9.4 9.5    Current antihypertensive regimen:  Amlodipine 5 mg daily Atenolol 50 mg daily Hyzaar 100/12.5 mg  daily  How often are you checking your Blood Pressure? Patient's daughter states she is checking once weekly to once every 2 weeks.   Current home BP readings: 02/17/21 164/73,  02/20/21 150/67,  02/25/21 116/70,  03/06/21 155/72.  What recent interventions/DTPs have been made by any provider to improve Blood Pressure control since last CPP Visit: No recent interventions  Any recent hospitalizations or ED visits since last  visit with CPP? No recent hospital visits.  What diet changes have been made to improve Blood Pressure Control?  Patient doesn't eat a lot Breakfast - patient will have cereal or an egg sandwich Lunch - patient will have a sandwich, sometime a salad Dinner - patient will have a sandwich and sometimes a little bit of what her daughter will cook.  She does like and has a lot of jelly with mayo sandwiches.   What exercise is being done to improve your Blood Pressure Control?  Patients daughter will make her get up and walk around the house.   Adherence Review: Is the patient currently on ACE/ARB medication? Yes Does the patient have >5 day gap between last estimated fill dates? Yes  Care Gaps: AWV - completed 02/13/2021 Last BP - 142/70 on 03/08/2021 Covid- 19 vaccine - never done Zoster vaccines - never done Dexa scan - never done Influenza vaccine - overdue   Star Rating Drugs: Atorvastatin 20mg  - last filled on 04/02/20 90DS at [facility] 100-12.5mg  - last filled on 07/16/20 90DS at [facility] dates with April  Janie Parks Pharmacist Assistant [phone]

## 2021-03-15 ENCOUNTER — Ambulatory Visit: Payer: PPO | Admitting: Family Medicine

## 2021-03-15 ENCOUNTER — Encounter: Payer: Self-pay | Admitting: Family Medicine

## 2021-03-15 ENCOUNTER — Ambulatory Visit (INDEPENDENT_AMBULATORY_CARE_PROVIDER_SITE_OTHER): Payer: PPO | Admitting: Family Medicine

## 2021-03-15 ENCOUNTER — Ambulatory Visit (INDEPENDENT_AMBULATORY_CARE_PROVIDER_SITE_OTHER): Payer: PPO

## 2021-03-15 VITALS — BP 130/62 | HR 67 | Temp 98.4°F | Ht 61.0 in | Wt 117.2 lb

## 2021-03-15 DIAGNOSIS — N3000 Acute cystitis without hematuria: Secondary | ICD-10-CM

## 2021-03-15 DIAGNOSIS — I10 Essential (primary) hypertension: Secondary | ICD-10-CM

## 2021-03-15 DIAGNOSIS — F03C11 Unspecified dementia, severe, with agitation: Secondary | ICD-10-CM

## 2021-03-15 DIAGNOSIS — R413 Other amnesia: Secondary | ICD-10-CM

## 2021-03-15 DIAGNOSIS — E782 Mixed hyperlipidemia: Secondary | ICD-10-CM

## 2021-03-15 NOTE — Patient Instructions (Signed)
Visit Information  Thank you for taking time to visit with me today. Please don't hesitate to contact me if I can be of assistance to you before our next scheduled telephone appointment. Please review the EMMI education Tips for caregivers of people with Alzheimer Disease Following are the goals we discussed today:  Take all medications as prescribed Attend all scheduled provider appointments Call provider office for new concerns or questions  check blood pressure 3 times per week choose a place to take my blood pressure (home, clinic or office, retail store) take blood pressure log to all doctor appointments eat more whole grains, fruits and vegetables, lean meats and healthy fats limit salt intake to 2300mg /day call for medicine refill 2 or 3 days before it runs out take all medications exactly as prescribed call doctor with any symptoms you believe are related to your medicine Alzheimer's Disease Caregiver Guide Alzheimer's disease is a condition that makes a person: Forget things. Act differently. Have trouble paying attention and doing simple tasks. These things get worse with time. The tips below can help you care for the person. How to help manage lifestyle changes Tips to help with symptoms Be calm and patient. Give simple, short answers to questions. Avoid correcting the person in a negative way. Try not to take things personally, even if the person forgets your name. Do not argue with the person. This may make the person more upset. Tips to lessen frustration Make appointments and do daily tasks when the person is at his or her best. Take your time. Simple tasks may take longer. Allow plenty of time to complete tasks. Limit choices for the person. Involve the person in what you are doing. Keep things organized: Keep a daily routine. Organize medicines in a pillbox for each day of the week. Keep a calendar in a central location to remind the person of meetings or other  activities. Avoid new or crowded places, if possible. Use simple words, short sentences, and a calm voice. Only give one direction at a time. Buy clothes and shoes that are easy to put on and take off. Try to change the subject if the person becomes frustrated or angry. Tips to prevent injury  Keep floors clear. Remove rugs, magazine racks, and floor lamps. Keep hallways well-lit. Put a handrail and non-slip mat in the bathtub or shower. Put childproof locks on cabinets that have dangerous items in them. These items include medicine, alcohol, guns, toxic cleaning items, sharp tools, matches, and lighters. Put locks on doors where the person cannot see or reach them. This helps keep the person from going out of the house and getting lost. Be ready for emergencies. Keep a list of emergency phone numbers and addresses close by. Remove car keys and lock garage doors so that the person does not try to drive. Bracelets may be worn that track location and identify the person as having memory problems. This should be worn at all times for safety. Tips for the future  Discuss financial and legal planning early. People with this disease have trouble managing their money as the disease gets worse. Get help from a professional. Talk about advance directives, safety, and daily care. Take these steps: Create a living will and choose a power of attorney. This is someone who can make decisions for the person with Alzheimer's disease when he or she can no longer do so. Discuss driving safety and when to stop driving. The person's doctor can help with this. If the  person lives alone, make sure he or she is safe. Some people need extra help at home. Other people need more care at a nursing home or care center. How to recognize changes in the person's condition With this disease, memory problems and confusion slowly get worse. In time, the person may not know his or her friends and family members. The disease  can also cause changes in behavior and mood, such as anxiety or anger. The person may see, hear, taste, smell, or feel things that are not real (hallucinate). These changes can come on all of a sudden. They may happen in response to something such as: Pain. An infection. Changes in temperature or noise. Too much stimulation. Feeling lost or scared. Medicines. Where to find support Find out about services that can provide short-term care (respite care). These can allow you to take a break when you need it. Join a support group near you. These groups can help you: Learn ways to manage stress. Share experiences with others. Get emotional comfort and support. Learn about caregiving as the disease gets worse. Know what community resources are available. Where to find more information Alzheimer's Association: [employer] Contact a doctor if: The person has a fever. The person has a sudden behavior change that does not get better with calming strategies. The person is not able to take care of himself or herself at home. You are no longer able to care for the person. Get help right away if: The person has a sudden increase in confusion or new hallucinations. The person threatens you or anyone else, including himself or herself. Get help right away if you feel like your loved one may hurt himself or herself or others, or has thoughts about taking his or her own life. Go to your nearest emergency room or: Call your local emergency services (911 in the [place] Call the [facility] at 214-431-4794 or 988 in the [place] This is open 24 hours a day. Text the Crisis Text Line at 305-735-6014. Summary Alzheimer's disease causes a person to forget things. A person who has this condition may have trouble doing simple tasks. Take steps to keep the person from getting hurt. Plan for future care. You can find support by joining a support group near you. This information is not intended  to replace advice given to you by your health care provider. Make sure you discuss any questions you have with your health care provider. Document Revised: 08/22/2020 Document Reviewed: 05/16/2019 Elsevier Patient Education  2022 [place] next appointment is by telephone on 04/12/21 at 10 AM  Please call the care guide team at [phone] if you need to cancel or reschedule your appointment.   If you are experiencing a Mental Health or [place] or need someone to talk to, please call the Suicide and Crisis Lifeline: 988 call the [place] National Suicide Prevention Lifeline: 843-513-8520 or TTY: 208-258-6943 TTY 701-477-2964) to talk to a trained counselor call 1-800-273-TALK (toll free, 24 hour hotline) go to [facility] [address] [place] [phone]) call 911   The patient verbalized understanding of instructions, educational materials, and care plan provided today and agreed to receive a mailed copy of patient instructions, educational materials, and care plan.  Peter Garter RN, Jackquline Denmark, CDE Care Management Coordinator [facility] [phone]

## 2021-03-15 NOTE — Chronic Care Management (AMB) (Signed)
Chronic Care Management   CCM RN Visit Note  03/15/2021 Name: Rhonda Weaver MRN: [id number] DOB: 1930/07/25  Subjective: Rhonda Weaver is a 88 y.o. year old female who is a primary care patient of Koberlein, Steele Berg, MD. The care management team was consulted for assistance with disease management and care coordination needs.    Engaged with patient by telephone for follow up visit in response to provider referral for case management and/or care coordination services.   Consent to Services:  The patient was given information about Chronic Care Management services, agreed to services, and gave verbal consent prior to initiation of services.  Please see initial visit note for detailed documentation.   Patient agreed to services and verbal consent obtained.   Assessment: Review of patient past medical history, allergies, medications, health status, including review of consultants reports, laboratory and other test data, was performed as part of comprehensive evaluation and provision of chronic care management services.   SDOH (Social Determinants of Health) assessments and interventions performed:    CCM Care Plan  Allergies  Allergen Reactions   Fenofibrate     REACTION: pt states intolerant   Gemfibrozil     REACTION: pt states intolerant   Niacin     REACTION: pt states intolerant   Penicillins     REACTION: Hx of arm swelling \T\ itching after shot   Sulfamethoxazole-Trimethoprim     REACTION: "deathly sick"  w/  N\T\V   Sulfonamide Derivatives     REACTION: nausea and vomiting    Outpatient Encounter Medications as of 03/15/2021  Medication Sig   acetaminophen (TYLENOL) 500 MG tablet Take 500 mg by mouth every 6 (six) hours as needed.   amLODipine (NORVASC) 5 MG tablet Take 1 tablet (5 mg total) by mouth daily.   atenolol (TENORMIN) 50 MG tablet Take 1 tablet (50 mg total) by mouth daily.   atorvastatin (LIPITOR) 20 MG tablet Take 1 tablet by mouth once daily    donepezil (ARICEPT) 5 MG tablet Take 1 tablet (5 mg total) by mouth at bedtime.   famotidine (PEPCID) 20 MG tablet Take 1 tablet (20 mg total) by mouth 2 (two) times daily.   fluticasone (FLONASE) 50 MCG/ACT nasal spray USE TWO SPRAY(S) IN EACH NOSTRIL ONCE DAILY   fluticasone (FLOVENT HFA) 44 MCG/ACT inhaler Inhale 2 puffs into the lungs as needed.   losartan-hydrochlorothiazide (HYZAAR) 100-12.5 MG tablet Take 1 tablet by mouth once daily   meclizine (ANTIVERT) 25 MG tablet Take 1 tablet (25 mg total) by mouth 3 (three) times daily as needed for dizziness.   nitrofurantoin, macrocrystal-monohydrate, (MACROBID) 100 MG capsule Take 1 capsule (100 mg total) by mouth 2 (two) times daily.   potassium chloride (KLOR-CON M) 10 MEQ tablet Take 1 tablet (10 mEq total) by mouth daily.   No facility-administered encounter medications on file as of 03/15/2021.    Patient Active Problem List   Diagnosis Date Noted   Allergic rhinitis 09/28/2007   Osteoarthritis 09/28/2007   HYPERLIPIDEMIA, MIXED 10/15/2006   Essential hypertension 10/15/2006   GERD 10/15/2006    Conditions to be addressed/monitored:HTN, HLD, and Dementia  Care Plan : RN Care Manager Plan of Care  Updates made by Dimitri Ped, RN since 03/15/2021 12:00 AM     Problem: Chronic Disease Management and Care Coordination Needs (Dementia,HTN, osteoarthritis and HLD)   Priority: High     Long-Range Goal: Establish Plan of Care for Chronic Disease Management Needs (Dementia,HTN, osteoarthritis and HLD)  Start Date: 01/25/2021  Expected End Date: 07/24/2021  Priority: High  Note:   Current Barriers:  Knowledge Deficits related to plan of care for management of CAD, HLD, Dementia, and Osteoarthritis  Care Coordination needs related to Level of care concerns, ADL IADL limitations, Memory Deficits, Inability to perform ADL's independently, Inability to perform IADL's independently, and Lacks knowledge of community resource: personal  care services Chronic Disease Management support and education needs related to HTN, HLD, Dementia, and Osteoarthritis  Spoke with daughter [name] pt gives consent to speak with her.  States pt saw her doctor this morning and pt is to see neurology on 03/18/21. States pt has had a UTI but will only take her antibiotic in the morning.  States she is giving pt her medications and making sure she is eating and drinking enough fluids. States pt does not like to take any new medication especially later in the day. States she has not been taking the Citrucel pill. States she is not sure if pt is having regular bowel movements. States that pt still confused in the afternoon and night.   States she is not sure pt is bathing but she dresses herself.  States pt does not know her later in the day and she is restless and roams around the house.  States she has alarms on the doors so she knows if she tries to go outside  . States she has not checked pts B/P as she has been seen at the doctors and it has been better there [place]):  Patient will verbalize understanding of plan for management of HTN, HLD, Dementia, and Osteoarthritis as evidenced by voiced adherence to plan of care verbalize basic understanding of  HTN, HLD, Dementia, and Osteoarthritis disease process and self health management plan as evidenced by voiced understanding and teach back take all medications exactly as prescribed and will call provider for medication related questions as evidenced by dispense report and pt verbalization attend all scheduled medical appointments: Neurotology 03/18/21, CCM LCSW 03/22/21,  ,CCM PharmD 06/04/21 as evidenced by medical records demonstrate Improved adherence to prescribed treatment plan for HTN, HLD, Dementia, and Osteoarthritis as evidenced by readings within limits, voiced adherence to plan of care continue to work with RN Care Manager to address care management and care coordination needs related  to  HTN, HLD, Dementia, and Osteoarthritis as evidenced by adherence to CM Team Scheduled appointments work with Education officer, museum to address  related to the management of Level of care concerns, ADL IADL limitations, Memory Deficits, Inability to perform ADL's independently, Inability to perform IADL's independently, and Lacks knowledge of community resource: caregiver stress related to the management of HTN, HLD, Dementia, and Osteoarthritis as evidenced by review of EMR and patient or Education officer, museum report work with Data processing manager care guide to address needs related to  ADL IADL limitations, Memory Deficits, Inability to perform ADL's independently, Inability to perform IADL's independently, and Lacks knowledge of community resource: personal care services as evidenced by patient and/or community resource care guide support through collaboration with Consulting civil engineer, provider, and care team.   Interventions: 1:1 collaboration with primary care provider regarding development and update of comprehensive plan of care as evidenced by provider attestation and co-signature Inter-disciplinary care team collaboration (see longitudinal plan of care) Evaluation of current treatment plan related to  self management and patient's adherence to plan as established by provider   Hyperlipidemia Interventions:  (Status:  Condition stable.  Not addressed this visit.) Long  Term Goal Medication review performed; medication list updated in electronic medical record.  Provider established cholesterol goals reviewed Counseled on importance of regular laboratory monitoring as prescribed Reviewed role and benefits of statin for ASCVD risk reduction  Hypertension Interventions:  (Status:  Goal on track:  Yes.) Long Term Goal Last practice recorded BP readings:  BP Readings from Last 3 Encounters:  01/23/21 140/80  01/12/21 (!) 194/84  08/17/19 140/69  Most recent eGFR/CrCl: No results found for: EGFR  No components  found for: CRCL  Evaluation of current treatment plan related to hypertension self management and patient's adherence to plan as established by provider Provided education to patient re: stroke prevention, s/s of heart attack and stroke Reviewed medications with patient and discussed importance of compliance Discussed plans with patient for ongoing care management follow up and provided patient with direct contact information for care management team Advised patient, providing education and rationale, to monitor blood pressure daily and record, calling PCP for findings outside established parameters Provided education on prescribed diet low sodium Reinforced goals for B/P and to call provider if pt has lower readings with symptoms   Dementia:  (Status:  Goal on track:  Yes.)  Long Term Goal Evaluation of current treatment plan related to misuse of: with behavioral disturbance Reviewed medications including Aricept , Emotional Support Provided to patient/caregiver, Sleep hygiene recommendations and education provided, Consideration of in-home help encouraged , Referred to LCSW for assistance with Guardianship discussion, Discussed importance of attendance to all provider appointments, and  Reviewed to get the Citrucel powder for the pt to drink or Bene fiber to mix in her other fluids.  Encouraged to try to redirect pt when she tries to leave.  Mailed EMMI education Tips for caregivers of people with Alzheimer's disease. Reinforced importance of pt drinking adequate amounts of fluids and taking medications as directed, Discussed with caregiver ways to handle stress and care of pt  Patient Goals/Self-Care Activities: Take all medications as prescribed Attend all scheduled provider appointments Call provider office for new concerns or questions  check blood pressure 3 times per week choose a place to take my blood pressure (home, clinic or office, retail store) take blood pressure log to all doctor  appointments eat more whole grains, fruits and vegetables, lean meats and healthy fats limit salt intake to 2365m/day call for medicine refill 2 or 3 days before it runs out take all medications exactly as prescribed call doctor with any symptoms you believe are related to your medicine  Follow Up Plan:  Telephone follow up appointment with care management team member scheduled for:  04/12/21 The patient has been provided with contact information for the care management team and has been advised to call with any health related questions or concerns.       Plan:Telephone follow up appointment with care management team member scheduled for:  04/12/21 The patient has been provided with contact information for the care management team and has been advised to call with any health related questions or concerns.  MPeter GarterRN, BJackquline Denmark CDE Care Management Coordinator [facility] ([phone]

## 2021-03-15 NOTE — Patient Instructions (Signed)
Some options for agitation to start/sleep - we could consider medication like citalopram to help with mood/agitation. This can be taken in the day. We could consider medication to help with sleep at night (like trazodone).

## 2021-03-15 NOTE — Progress Notes (Signed)
DANNYA PITKIN DOB: 1930/05/07 Encounter date: 03/15/2021  This is a 88 y.o. female who presents with Chief Complaint  Patient presents with   Follow-up    Patient's daughter stated she is concerned as the patient was "agitated last week and stated she wanted to go home"    History of present illness: Last week daughter was having hard time with mom - wanting to leave, getting agitated, wanted to go home.   Hasn't been compliant with abx for urine. Did ok first day, but hard to be taking things twice daily. Sleepy during the day because she is up all night.   Up since 3am this morning. Usually she starts having a hard time right around daughter's bedtime at 10. During rest of the day mood varies. Can be good, but she is forgetful with eating, taking medications. Just a little more confused during day.   Citrucel seemed to make her more constipated. States that last bm was yesterday, but daughter uncertain. Hasn't had pepto.   Patient did call operator last week; she was thinking she was by herself. Daughter talked with police and they did make note on their number so that if calls occur again in future, they are aware of Ms Senna's memory status.   Allergies  Allergen Reactions   Fenofibrate     REACTION: pt states intolerant   Gemfibrozil     REACTION: pt states intolerant   Niacin     REACTION: pt states intolerant   Penicillins     REACTION: Hx of arm swelling \\T \ itching after shot   Sulfamethoxazole-Trimethoprim     REACTION: "deathly sick"  w/  N\T\V   Sulfonamide Derivatives     REACTION: nausea and vomiting   Current Meds  Medication Sig   acetaminophen (TYLENOL) 500 MG tablet Take 500 mg by mouth every 6 (six) hours as needed.   amLODipine (NORVASC) 5 MG tablet Take 1 tablet (5 mg total) by mouth daily.   atenolol (TENORMIN) 50 MG tablet Take 1 tablet (50 mg total) by mouth daily.   atorvastatin (LIPITOR) 20 MG tablet Take 1 tablet by mouth once daily   donepezil  (ARICEPT) 5 MG tablet Take 1 tablet (5 mg total) by mouth at bedtime.   famotidine (PEPCID) 20 MG tablet Take 1 tablet (20 mg total) by mouth 2 (two) times daily.   fluticasone (FLONASE) 50 MCG/ACT nasal spray USE TWO SPRAY(S) IN EACH NOSTRIL ONCE DAILY   fluticasone (FLOVENT HFA) 44 MCG/ACT inhaler Inhale 2 puffs into the lungs as needed.   losartan-hydrochlorothiazide (HYZAAR) 100-12.5 MG tablet Take 1 tablet by mouth once daily   meclizine (ANTIVERT) 25 MG tablet Take 1 tablet (25 mg total) by mouth 3 (three) times daily as needed for dizziness.   nitrofurantoin, macrocrystal-monohydrate, (MACROBID) 100 MG capsule Take 1 capsule (100 mg total) by mouth 2 (two) times daily.   potassium chloride (KLOR-CON M) 10 MEQ tablet Take 1 tablet (10 mEq total) by mouth daily.    Review of Systems  Constitutional:  Negative for chills, fatigue and fever.  Respiratory:  Negative for cough, chest tightness, shortness of breath and wheezing.   Cardiovascular:  Negative for chest pain, palpitations and leg swelling.   Objective:  BP 130/62 (BP Location: Left Arm, Patient Position: Sitting, Cuff Size: Normal)    Pulse 67    Temp 98.4 F (36.9 C) (Oral)    Ht 5\' 1"  (1.549 m)    Wt 117 lb 3.2 oz (53.2 kg)  SpO2 96%    BMI 22.14 kg/m   Weight: 117 lb 3.2 oz (53.2 kg)   BP Readings from Last 3 Encounters:  03/15/21 130/62  03/08/21 (!) 142/70  01/23/21 140/80   Wt Readings from Last 3 Encounters:  03/15/21 117 lb 3.2 oz (53.2 kg)  03/08/21 118 lb 6.4 oz (53.7 kg)  02/13/21 113 lb (51.3 kg)    Physical Exam Pulmonary:     Effort: Pulmonary effort is normal.  Neurological:     Mental Status: She is alert.  Psychiatric:        Mood and Affect: Mood normal.        Behavior: Behavior is cooperative.        Cognition and Memory: Memory is impaired.     Comments: Ms.Mangels is very pleasant and cooperative for exam and questions. She is poor historian, but daughter helps with filling in details/answers  to questions.    Assessment/Plan 1. Acute cystitis without hematuria Encouraged her to complete antibiotic for UTI. She was complaining of urinary symptoms prior to treatment and culture was obtained and abnormal at last visit.  - Urine Culture; Future  2. Severe dementia with agitation, unspecified dementia type Advancing memory concerns. Increased evening agitation. Patient does not want to take additional medications or allow daughter to manage medications. She does have neurology appointment next week, so daughter is planning on discussing at that visit.  Care management is on board as well.  Daughter worries about her ability to be able to continue to care for her mother, especially in the evening when agitation is worse.  We will reach out to care management to make sure that daughter is aware of available options for her in this case.    Return in about 1 month (around 04/12/2021) for Chronic condition visit.     Micheline Rough, MD

## 2021-03-18 ENCOUNTER — Other Ambulatory Visit: Payer: Self-pay

## 2021-03-18 ENCOUNTER — Ambulatory Visit (INDEPENDENT_AMBULATORY_CARE_PROVIDER_SITE_OTHER): Payer: PPO | Admitting: Physician Assistant

## 2021-03-18 ENCOUNTER — Other Ambulatory Visit (INDEPENDENT_AMBULATORY_CARE_PROVIDER_SITE_OTHER): Payer: PPO

## 2021-03-18 ENCOUNTER — Encounter: Payer: Self-pay | Admitting: Physician Assistant

## 2021-03-18 VITALS — BP 129/61 | HR 73 | Resp 18 | Ht 61.0 in | Wt 116.0 lb

## 2021-03-18 DIAGNOSIS — R413 Other amnesia: Secondary | ICD-10-CM | POA: Diagnosis not present

## 2021-03-18 DIAGNOSIS — F0150 Vascular dementia without behavioral disturbance: Secondary | ICD-10-CM

## 2021-03-18 LAB — VITAMIN B12: Vitamin B-12: 408 pg/mL (ref 200–1100)

## 2021-03-18 LAB — TSH: TSH: 1.37 mIU/L (ref 0.40–4.50)

## 2021-03-18 NOTE — Progress Notes (Signed)
Assessment/Plan:   Rhonda Weaver is a very pleasant 88 y.o. year old RH female with  a history of hypertension, hyperlipidemia, anxiety, history of thyroid nodules and lung nodules (being worked up by PCP), depression seen today for evaluation of memory loss. MMSE today 18/30, with delayed recall 0/3, orientation 2/5.  She was unable to spell backwards, or repeat phrases.  She is on donepezil 5 mg daily.  MRI of the brain remarkable for chronic small vessel ischemia and generalized volume loss without clear lobar predilection.    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  Continue 24/7 care Discussed the importance of regular daily schedule to maintain brain function.  Continue to monitor mood with PCP.  Stay active at least 30 minutes at least 3 times a week.  Naps should be scheduled and should be no longer than 60 minutes and should not occur after 2 PM.  Increase donepezil to 10 mg p.o. nightly Recommend strong control cardiovascular risk factors  Mediterranean diet is recommended  Folllow up in 3 months  Subjective:    The patient is seen in neurologic consultation at the request of Caren Macadam, MD for the evaluation of memory.  The patient is accompanied by her daughter who supplements the history. This is a 88 y.o. year old RH  female who has had memory issues for about several years but after her recent fall in December 2022 "things changed".  In review, she sustained a mechanical fall requiring evaluation at the ED.  Since then, she was brought to her daughter's home, who noted significant cognitive declining her mother.  Until that point, they had seen each other often, but living with her brought "everything out ".  It is unclear how many years she could have had the symptoms.  She then began to notice that her mother was repeating herself, was disoriented when walking  into her room, and when confronted, she was in denial of any cognitive deficiencies.  She also noticed that about her medications, her only "go-to" meds were Dramamine and Pepto-Bismol.  After December, she was placed on donepezil 5 mg daily.  She denies leaving objects in unusual places, but cannot find things that she left behind.  She denies any head injuries, when she fell she reduced her back.  She no longer drives.  Her mood is worse in the evening, with intermittent periods of agitation.  She has some depression since her friend [employer], and at times, she hallucinates and sees him.  She denies any paranoia.  She sleeps well, but she has vivid dreams, denies sleepwalking or REM behavior.  There are significant hygiene concerns, she refuses to take a shower, she does not like to change her clothes.  Her daughter is in charge of the finances.  Her appetite is good, denies trouble swallowing.  She does not cook.  She denies any headaches, double vision, dizziness, focal numbness or tingling, unilateral weakness, tremors or anosmia. No history of seizures. Denies urine incontinence, retention, constipation or diarrhea.  Denies OSA, ETOH or Tobacco. Family History remarkable for father with Alzheimer's disease.  She is retired Chartered certified accountant in [place] (she reports that continues to work but she has not done so for the last 10 years)   MRI of the brain 01/12/2021 without contrast was negative for acute intracranial abnormalities, but with findings of chronic small vessel ischemia  and generalized volume loss without clear lobar predilection.   Allergies  Allergen Reactions   Fenofibrate     REACTION: pt states intolerant   Gemfibrozil     REACTION: pt states intolerant   Niacin     REACTION: pt states intolerant   Penicillins     REACTION: Hx of arm swelling \\T \ itching after shot   Sulfamethoxazole-Trimethoprim     REACTION: "deathly sick"  w/  N\T\V   Sulfonamide Derivatives     REACTION: nausea  and vomiting    Current Outpatient Medications  Medication Instructions   acetaminophen (TYLENOL) 500 mg, Every 6 hours PRN   amLODipine (NORVASC) 5 mg, Oral, Daily   atenolol (TENORMIN) 50 mg, Oral, Daily   atorvastatin (LIPITOR) 20 MG tablet Take 1 tablet by mouth once daily   donepezil (ARICEPT) 5 mg, Oral, Daily at bedtime   famotidine (PEPCID) 20 mg, Oral, 2 times daily   fluticasone (FLONASE) 50 MCG/ACT nasal spray USE TWO SPRAY(S) IN EACH NOSTRIL ONCE DAILY   fluticasone (FLOVENT HFA) 44 MCG/ACT inhaler 2 puffs, Inhalation, As needed   losartan-hydrochlorothiazide (HYZAAR) 100-12.5 MG tablet Take 1 tablet by mouth once daily   meclizine (ANTIVERT) 25 mg, Oral, 3 times daily PRN   nitrofurantoin (macrocrystal-monohydrate) (MACROBID) 100 mg, Oral, 2 times daily   potassium chloride (KLOR-CON M) 10 MEQ tablet 10 mEq, Oral, Daily     VITALS:   Vitals:   03/18/21 1436  BP: 129/61  Pulse: 73  Resp: 18  SpO2: 97%  Weight: 116 lb (52.6 kg)  Height: 5\' 1"  (1.549 m)    PHYSICAL EXAM   HEENT:  Normocephalic, atraumatic. The mucous membranes are moist. The superficial temporal arteries are without ropiness or tenderness. Cardiovascular: Regular rate and rhythm. Lungs: Clear to auscultation bilaterally. Neck: There are no carotid bruits noted bilaterally.  NEUROLOGICAL: [place] Cognitive Assessment  03/17/2021  Visuospatial/ Executive (0/5) 1  Naming (0/3) 3  Attention: Read list of digits (0/2) 1  Attention: Read list of letters (0/1) 0  Attention: Serial 7 subtraction starting at 100 (0/3) 0  Language: Repeat phrase (0/2) 2  Language : Fluency (0/1) 0  Abstraction (0/2) 0  Delayed Recall (0/5) 0  Orientation (0/6) 2  Total 9  Adjusted Score (based on education) 10   MMSE - [place] Exam 03/19/2021 04/07/2017  Orientation to time 2 1  Orientation to Place 5 5  Registration 3 3  Attention/ Calculation 1 1  Recall 0 0  Language- name 2 objects 2 2  Language-  repeat 0 1  Language- follow 3 step command 3 3  Language- read & follow direction 1 1  Write a sentence 1 1  Copy design 0 1  Total score 18 19    No flowsheet data found.   Orientation:  Alert and oriented to person, place and time. No aphasia or dysarthria. Fund of knowledge is appropriate. Recent memory impaired and remote memory intact.  Attention and concentration are normal.  Able to name objects and repeat phrases. Delayed recall   Cranial nerves: There is good facial symmetry. Extraocular muscles are intact and visual fields are full to confrontational testing. Speech is fluent and clear. Soft palate rises symmetrically and there is no tongue deviation. Hearing is intact to conversational tone. Tone: Tone is good throughout. Sensation: Sensation is intact to light touch and pinprick throughout. Vibration is intact at the bilateral big toe.There is no extinction with double simultaneous stimulation. There is no sensory  dermatomal level identified. Coordination: The patient has no difficulty with RAM's or FNF bilaterally. Normal finger to nose  Motor: Strength is 5/5 in the bilateral upper and lower extremities. There is no pronator drift. There are no fasciculations noted. DTR's: Deep tendon reflexes are 2/4 at the bilateral biceps, triceps, brachioradialis, patella and achilles.  Plantar responses are downgoing bilaterally. Gait and Station: The patient is able to ambulate without difficulty.The patient is able to heel toe walk without any difficulty.The patient is able to ambulate in a tandem fashion. The patient is able to stand in the Romberg position.     Thank you for allowing [place] the opportunity to participate in the care of this nice patient. Please do not hesitate to contact us for any questions or concerns.   Total time spent on today's visit was 60 minutes, including both face-to-face time and nonface-to-face time.  Time included that spent on review of records (prior notes  available to me/labs/imaging if pertinent), discussing treatment and goals, answering patient's questions and coordinating care.  Cc:  Caren Macadam, MD  Sharene Butters 03/19/2021 8:13 AM

## 2021-03-18 NOTE — Patient Instructions (Addendum)
It was a pleasure to see you today at our office.   Recommendations:  Meds: Follow up in  3 months Check labs today  Increase Donepezil to 10 mg daily  No driving    RECOMMENDATIONS FOR ALL PATIENTS WITH MEMORY PROBLEMS: 1. Continue to exercise (Recommend 30 minutes of walking everyday, or 3 hours every week) 2. Increase social interactions - continue going to [place] and enjoy social gatherings with friends and family 3. Eat healthy, avoid fried foods and eat more fruits and vegetables 4. Maintain adequate blood pressure, blood sugar, and blood cholesterol level. Reducing the risk of stroke and cardiovascular disease also helps promoting better memory. 5. Avoid stressful situations. Live a simple life and avoid aggravations. Organize your time and prepare for the next day in anticipation. 6. Sleep well, avoid any interruptions of sleep and avoid any distractions in the bedroom that may interfere with adequate sleep quality 7. Avoid sugar, avoid sweets as there is a strong link between excessive sugar intake, diabetes, and cognitive impairment We discussed the Mediterranean diet, which has been shown to help patients reduce the risk of progressive memory disorders and reduces cardiovascular risk. This includes eating fish, eat fruits and green leafy vegetables, nuts like almonds and hazelnuts, walnuts, and also use olive oil. Avoid fast foods and fried foods as much as possible. Avoid sweets and sugar as sugar use has been linked to worsening of memory function.  There is always a concern of gradual progression of memory problems. If this is the case, then we may need to adjust level of care according to patient needs. Support, both to the patient and caregiver, should then be put into place.    The [employer] is here all day, every day for people facing Alzheimers disease through our free 24/7 Helpline: 312-222-6298. The Helpline provides reliable information and support to all  those who need assistance, such as individuals living with memory loss, Alzheimer's or other dementia, caregivers, health care professionals and the public.  Our highly trained and knowledgeable staff can help you with: Understanding memory loss, dementia and Alzheimer's  Medications and other treatment options  General information about aging and brain health  Skills to provide quality care and to find the best care from professionals  Legal, financial and living-arrangement decisions Our Helpline also features: Confidential care consultation provided by master's level clinicians who can help with decision-making support, crisis assistance and education on issues families face every day  Help in a caller's preferred language using our translation service that features more than 200 languages and dialects  Referrals to local community programs, services and ongoing support     FALL PRECAUTIONS: Be cautious when walking. Scan the area for obstacles that may increase the risk of trips and falls. When getting up in the mornings, sit up at the edge of the bed for a few minutes before getting out of bed. Consider elevating the bed at the head end to avoid drop of blood pressure when getting up. Walk always in a well-lit room (use night lights in the walls). Avoid area rugs or power cords from appliances in the middle of the walkways. Use a walker or a cane if necessary and consider physical therapy for balance exercise. Get your eyesight checked regularly.  FINANCIAL OVERSIGHT: Supervision, especially oversight when making financial decisions or transactions is also recommended.  HOME SAFETY: Consider the safety of the kitchen when operating appliances like stoves, microwave oven, and blender. Consider having supervision and share cooking  responsibilities until no longer able to participate in those. Accidents with firearms and other hazards in the house should be identified and addressed as  well.   ABILITY TO BE LEFT ALONE: If patient is unable to contact 911 operator, consider using LifeLine, or when the need is there, arrange for someone to stay with patients. Smoking is a fire hazard, consider supervision or cessation. Risk of wandering should be assessed by caregiver and if detected at any point, supervision and safe proof recommendations should be instituted.  MEDICATION SUPERVISION: Inability to self-administer medication needs to be constantly addressed. Implement a mechanism to ensure safe administration of the medications.   DRIVING: Regarding driving, in patients with progressive memory problems, driving will be impaired. We advise to have someone else do the driving if trouble finding directions or if minor accidents are reported. Independent driving assessment is available to determine safety of driving.   If you are interested in the driving assessment, you can contact the following:  [employer] in [place]  [place] [phone] or [phone]      [place] refers to food and lifestyle choices that are based on the traditions of countries located on the [employer]. This way of eating has been shown to help prevent certain conditions and improve outcomes for people who have chronic diseases, like kidney disease and heart disease. What are tips for following this plan? Lifestyle  Cook and eat meals together with your family, when possible. Drink enough fluid to keep your urine clear or pale yellow. Be physically active every day. This includes: Aerobic exercise like running or swimming. Leisure activities like gardening, walking, or housework. Get 7-8 hours of sleep each night. If recommended by your health care provider, drink red wine in moderation. This means 1 glass a day for nonpregnant women and 2  glasses a day for men. A glass of wine equals 5 oz (150 mL). Reading food labels  Check the serving size of packaged foods. For foods such as rice and pasta, the serving size refers to the amount of cooked product, not dry. Check the total fat in packaged foods. Avoid foods that have saturated fat or trans fats. Check the ingredients list for added sugars, such as corn syrup. Shopping  At the grocery store, buy most of your food from the areas near the walls of the store. This includes: Fresh fruits and vegetables (produce). Grains, beans, nuts, and seeds. Some of these may be available in unpackaged forms or large amounts (in bulk). Fresh seafood. Poultry and eggs. Low-fat dairy products. Buy whole ingredients instead of prepackaged foods. Buy fresh fruits and vegetables in-season from local farmers markets. Buy frozen fruits and vegetables in resealable bags. If you do not have access to quality fresh seafood, buy precooked frozen shrimp or canned fish, such as tuna, salmon, or sardines. Buy small amounts of raw or cooked vegetables, salads, or olives from the deli or salad bar at your store. Stock your pantry so you always have certain foods on hand, such as olive oil, canned tuna, canned tomatoes, rice, pasta, and beans. Cooking  Cook foods with extra-virgin olive oil instead of using butter or other vegetable oils. Have meat as a side dish, and have vegetables or grains as your main dish. This means having meat in small portions or adding small amounts of meat to foods like pasta or stew. Use beans or vegetables  instead of meat in common dishes like chili or lasagna. Experiment with different cooking methods. Try roasting or broiling vegetables instead of steaming or sauteing them. Add frozen vegetables to soups, stews, pasta, or rice. Add nuts or seeds for added healthy fat at each meal. You can add these to yogurt, salads, or vegetable dishes. Marinate fish or vegetables using olive  oil, lemon juice, garlic, and fresh herbs. Meal planning  Plan to eat 1 vegetarian meal one day each week. Try to work up to 2 vegetarian meals, if possible. Eat seafood 2 or more times a week. Have healthy snacks readily available, such as: Vegetable sticks with hummus. Greek yogurt. Fruit and nut trail mix. Eat balanced meals throughout the week. This includes: Fruit: 2-3 servings a day Vegetables: 4-5 servings a day Low-fat dairy: 2 servings a day Fish, poultry, or lean meat: 1 serving a day Beans and legumes: 2 or more servings a week Nuts and seeds: 1-2 servings a day Whole grains: 6-8 servings a day Extra-virgin olive oil: 3-4 servings a day Limit red meat and sweets to only a few servings a month What are my food choices? Mediterranean diet Recommended Grains: Whole-grain pasta. Brown rice. Bulgar wheat. Polenta. Couscous. Whole-wheat bread. [place]. Vegetables: Artichokes. Beets. Broccoli. Cabbage. Carrots. Eggplant. Green beans. Chard. Kale. Spinach. Onions. Leeks. Peas. Squash. Tomatoes. Peppers. Radishes. Fruits: Apples. Apricots. Avocado. Berries. Bananas. Cherries. Dates. Figs. Grapes. Lemons. Melon. Oranges. Peaches. Plums. Pomegranate. Meats and other protein foods: Beans. Almonds. Sunflower seeds. Pine nuts. Peanuts. [place]. Salmon. Scallops. Shrimp. [place]. Tilapia. Clams. Oysters. Eggs. Dairy: Low-fat milk. Cheese. Greek yogurt. Beverages: Water. Red wine. Herbal tea. Fats and oils: Extra virgin olive oil. Avocado oil. Grape seed oil. Sweets and desserts: [place] yogurt with honey. Baked apples. Poached pears. Trail mix. Seasoning and other foods: Basil. Cilantro. Coriander. Cumin. Mint. Parsley. Sage. Rosemary. Tarragon. Garlic. Oregano. Thyme. Pepper. Balsalmic vinegar. Tahini. Hummus. Tomato sauce. Olives. Mushrooms. Limit these Grains: Prepackaged pasta or rice dishes. Prepackaged cereal with added sugar. Vegetables: Deep fried potatoes (french fries). Fruits:  Fruit canned in syrup. Meats and other protein foods: Beef. Pork. Lamb. Poultry with skin. Hot dogs. [place] Salines. Dairy: Ice cream. Sour cream. Whole milk. Beverages: Juice. Sugar-sweetened soft drinks. Beer. Liquor and spirits. Fats and oils: Butter. Canola oil. Vegetable oil. Beef fat (tallow). Lard. Sweets and desserts: Cookies. Cakes. Pies. Candy. Seasoning and other foods: Mayonnaise. Premade sauces and marinades. The items listed may not be a complete list. Talk with your dietitian about what dietary choices are right for you. Summary The Mediterranean diet includes both food and lifestyle choices. Eat a variety of fresh fruits and vegetables, beans, nuts, seeds, and whole grains. Limit the amount of red meat and sweets that you eat. Talk with your health care provider about whether it is safe for you to drink red wine in moderation. This means 1 glass a day for nonpregnant women and 2 glasses a day for men. A glass of wine equals 5 oz (150 mL). This information is not intended to replace advice given to you by your health care provider. Make sure you discuss any questions you have with your health care provider. Document Released: 09/20/2015 Document Revised: 10/23/2015 Document Reviewed: 09/20/2015 Elsevier Interactive Patient Education  2017 [place] provider has requested that you have labwork completed today. Please go to [facility] ([id number]) on the second floor of this building before leaving the office today. You do not need to check in. If you  are not called within 15 minutes please check with the front desk.

## 2021-03-19 ENCOUNTER — Telehealth: Payer: Self-pay | Admitting: *Deleted

## 2021-03-19 NOTE — Progress Notes (Signed)
Please inform the patient that her vitamin B12 and TSH are normal.  Thank you

## 2021-03-19 NOTE — Telephone Encounter (Signed)
Spoke with the patient's daughter Mrs Unk Lightning and informed her of the message below.

## 2021-03-19 NOTE — Telephone Encounter (Signed)
-----   Message from Caren Macadam, MD sent at 03/17/2021  7:41 AM EST ----- If Ms. Kosinski is having issues with constipation; miralax every other day may be easier for her to tolerate than fiber. I was going to tell them at visit, but we started talking about other topics. If she is having regular Bms (every other day or so) then no need to add this. Can also just try some natural things - like prunes; or a little prune juice to help keep things more regular.

## 2021-03-21 ENCOUNTER — Other Ambulatory Visit: Payer: Self-pay

## 2021-03-21 DIAGNOSIS — N3000 Acute cystitis without hematuria: Secondary | ICD-10-CM | POA: Diagnosis not present

## 2021-03-22 ENCOUNTER — Ambulatory Visit: Payer: PPO | Admitting: Licensed Clinical Social Worker

## 2021-03-22 DIAGNOSIS — E782 Mixed hyperlipidemia: Secondary | ICD-10-CM

## 2021-03-22 DIAGNOSIS — M1991 Primary osteoarthritis, unspecified site: Secondary | ICD-10-CM

## 2021-03-22 DIAGNOSIS — I10 Essential (primary) hypertension: Secondary | ICD-10-CM

## 2021-03-22 LAB — URINE CULTURE
MICRO NUMBER:: 12986998
Result:: NO GROWTH
SPECIMEN QUALITY:: ADEQUATE

## 2021-03-28 MED ORDER — OMEPRAZOLE 20 MG PO CPDR: 20.0000 mg | DELAYED_RELEASE_CAPSULE | Freq: Every day | ORAL | 3 refills | Status: DC

## 2021-03-30 ENCOUNTER — Other Ambulatory Visit: Payer: Self-pay | Admitting: Family Medicine

## 2021-03-30 DIAGNOSIS — E782 Mixed hyperlipidemia: Secondary | ICD-10-CM

## 2021-03-30 DIAGNOSIS — I10 Essential (primary) hypertension: Secondary | ICD-10-CM

## 2021-04-01 ENCOUNTER — Encounter: Payer: Self-pay | Admitting: Family Medicine

## 2021-04-01 ENCOUNTER — Telehealth (INDEPENDENT_AMBULATORY_CARE_PROVIDER_SITE_OTHER): Payer: PPO | Admitting: Family Medicine

## 2021-04-01 ENCOUNTER — Telehealth: Payer: Self-pay | Admitting: Family Medicine

## 2021-04-01 ENCOUNTER — Other Ambulatory Visit: Payer: Self-pay | Admitting: Family Medicine

## 2021-04-01 VITALS — BP 116/59 | HR 62 | Temp 98.2°F

## 2021-04-01 DIAGNOSIS — I10 Essential (primary) hypertension: Secondary | ICD-10-CM

## 2021-04-01 DIAGNOSIS — E782 Mixed hyperlipidemia: Secondary | ICD-10-CM

## 2021-04-01 DIAGNOSIS — U071 COVID-19: Secondary | ICD-10-CM | POA: Diagnosis not present

## 2021-04-01 MED ORDER — MOLNUPIRAVIR EUA 200MG CAPSULE: 4.0000 | ORAL_CAPSULE | Freq: Two times a day (BID) | ORAL | 0 refills | Status: AC

## 2021-04-01 NOTE — Telephone Encounter (Signed)
Spoke with the patient's daughter and informed her a virtual visit is needed for evaluation.  Appt was scheduled with Dr Volanda Napoleon today at 11:30am.

## 2021-04-01 NOTE — Chronic Care Management (AMB) (Signed)
Chronic Care Management    Clinical Social Work Note  04/01/2021 Name: Rhonda Weaver MRN: [id number] DOB: 16-Feb-1930  Rhonda Weaver is a 88 y.o. year old female who is a primary care patient of Koberlein, Steele Berg, MD. The CCM team was consulted to assist the patient with chronic disease management and/or care coordination needs related to: Mental Health Counseling and Resources and Caregiver Stress.   Engaged with patient's daughter by telephone for follow up visit in response to provider referral for social work chronic care management and care coordination services.   Consent to Services:  The patient was given information about Chronic Care Management services, agreed to services, and gave verbal consent prior to initiation of services.  Please see initial visit note for detailed documentation.   Patient agreed to services and consent obtained.   Assessment: Review of patient past medical history, allergies, medications, and health status, including review of relevant consultants reports was performed today as part of a comprehensive evaluation and provision of chronic care management and care coordination services.     SDOH (Social Determinants of Health) assessments and interventions performed:    Advanced Directives Status: Not addressed in this encounter.  CCM Care Plan  Allergies  Allergen Reactions   Fenofibrate     REACTION: pt states intolerant   Gemfibrozil     REACTION: pt states intolerant   Niacin     REACTION: pt states intolerant   Penicillins     REACTION: Hx of arm swelling \\T \ itching after shot   Sulfamethoxazole-Trimethoprim     REACTION: "deathly sick"  w/  N\T\V   Sulfonamide Derivatives     REACTION: nausea and vomiting    Outpatient Encounter Medications as of 03/22/2021  Medication Sig   acetaminophen (TYLENOL) 500 MG tablet Take 500 mg by mouth every 6 (six) hours as needed.   atenolol (TENORMIN) 50 MG tablet Take 1 tablet (50 mg total) by  mouth daily.   donepezil (ARICEPT) 5 MG tablet Take 1 tablet (5 mg total) by mouth at bedtime. (Patient not taking: Reported on 03/18/2021)   famotidine (PEPCID) 20 MG tablet Take 1 tablet (20 mg total) by mouth 2 (two) times daily.   fluticasone (FLONASE) 50 MCG/ACT nasal spray USE TWO SPRAY(S) IN EACH NOSTRIL ONCE DAILY   fluticasone (FLOVENT HFA) 44 MCG/ACT inhaler Inhale 2 puffs into the lungs as needed.   meclizine (ANTIVERT) 25 MG tablet Take 1 tablet (25 mg total) by mouth 3 (three) times daily as needed for dizziness.   nitrofurantoin, macrocrystal-monohydrate, (MACROBID) 100 MG capsule Take 1 capsule (100 mg total) by mouth 2 (two) times daily. (Patient not taking: Reported on 03/18/2021)   potassium chloride (KLOR-CON M) 10 MEQ tablet Take 1 tablet (10 mEq total) by mouth daily.   [DISCONTINUED] amLODipine (NORVASC) 5 MG tablet Take 1 tablet (5 mg total) by mouth daily.   [DISCONTINUED] atorvastatin (LIPITOR) 20 MG tablet Take 1 tablet by mouth once daily   [DISCONTINUED] losartan-hydrochlorothiazide (HYZAAR) 100-12.5 MG tablet Take 1 tablet by mouth once daily   No facility-administered encounter medications on file as of 03/22/2021.    Patient Active Problem List   Diagnosis Date Noted   Allergic rhinitis 09/28/2007   Osteoarthritis 09/28/2007   HYPERLIPIDEMIA, MIXED 10/15/2006   Essential hypertension 10/15/2006   GERD 10/15/2006    Conditions to be addressed/monitored: HTN and Osteoarthritis; Limited social support, Memory Deficits, Inability to perform ADL's independently, and Inability to perform IADL's independently  Care Plan :  LCSW Plan of Care  Updates made by Rebekah Chesterfield, LCSW since 04/01/2021 12:00 AM     Problem: Quality of Life (General Plan of Care)      Long-Range Goal: Quality of Life Maintained   Start Date: 03/07/2021  This Visit's Progress: On track  Recent Progress: On track  Priority: High  Note:   Current Barriers:   Patient unable to  consistently perform activities of daily living and needs assistance in order to meet this unmet needs Clinical Goals: Patient will work with CCM LCSW to address needs related to management of health conditions to promote well-being  Clinical Interventions: Assessment of needs, barriers , agencies contacted, as well as how stressors are negatively impacting functioning. All hx provided by patient's daughter, [name] Patient has resided with adult daughter for approx 2 months. She has been observed to be very active at night and continues to endorse memory concerns throughout the day, often forgetting that she ate or taken her daily meds. She has brief episodes of sadness related to the recent loss of a close friend 2/10: Strategies to enhance safety at night, when patient is awake identified Patient has been observed to have balance concerns; however, she is not willing to utilize a cane to promote balance. No recent falls Daughter has received suggestions from Neurologist to assist in pt care and improving medication compliance Patient receives support from family. She is not currently open to aid services CCM LCSW discussed various options to promote safety and strengthen support system. The process regarding placement and/or obtaining in home aids discussed. LCSW will mail supportive documentation to address on file Family were provided education on Personal Care Service process from RN CM, per review of chart. Patient would need Medicaid and will be contacted by Care Guide to assist with application for PCS services 2/10: Daughter has received paperwork from [employer] and will review in the next few days Review various resources, discussed options and provided patient information Active listening / Reflection utilized  Emotional Support Provided Provided psychoeducation for mental health needs  Reviewed mental health medications and discussed importance of compliance: Patient refuses to take  Aricept at night, as prescribed. Daughter will address with PCP at upcoming PCP appt scheduled for 03/08/21 Caregiver stress acknowledged  Consideration of in-home help encouraged : options discussed Verbalization of feelings encouraged  Discussed caregiver resources and support:   1:1 collaboration with primary care provider regarding development and update of comprehensive plan of care as evidenced by provider attestation and co-signature Inter-disciplinary care team collaboration (see longitudinal plan of care) Patient Goals/Self-Care Activities: Over the next 120 days Attend scheduled appointments with providers Utilize healthy coping skills and/or supportive resources provided Contact PCP office with any questions or concerns       [name], MSW, [email or web address] Phone [phone] 7:42 AM

## 2021-04-01 NOTE — Telephone Encounter (Signed)
Pt daughter Rhonda Weaver is calling an would like joanne to return her call . Pt tested positive for covid yesterday.Pt temp is 98.2 this morning. Pt daughter decline a virtual appt would like to speak with joanne

## 2021-04-01 NOTE — Progress Notes (Signed)
Virtual Visit via Video Note  I connected with Rhonda Weaver on 04/01/21 at 11:30 AM EST by a video enabled telemedicine application 2/2 [id number] pandemic and verified that I am speaking with the correct person using two identifiers.  Location patient: home Location provider:work or home office Persons participating in the virtual visit: patient, provider, pt's daughter.  I discussed the limitations of evaluation and management by telemedicine and the availability of in person appointments. The patient expressed understanding and agreed to proceed.   HPI: Pt an 88 year old female with pmh sig for HTN, GERD, BS, OA, allergies, and HLD who was seen for acute concern.  Pt had a positive COVID test yesterday 03/31/2021. Sick contacts include pt's daughter and son in law who are recovering from [place].  Pt started getting stuffy nose, subjective fever temp 98.20F, and slight cough yesterday.  Denies ST, ear pain/pressure, HA, diarrhea, change in appetite.  Tried Tylenol.  Pt has not had any COVID vaccines.   ROS: See pertinent positives and negatives per HPI.  Past Medical History:  Diagnosis Date   Allergy    Cataract    Chronic bronchitis ([place]) 10/15/2006   Qualifier: Diagnosis of  By: Lenna Gilford MD, Deborra Medina    COLONIC POLYPS, ADENOMATOUS 10/15/2006   Qualifier: Diagnosis of  By: Lenna Gilford MD, Deborra Medina    DEGENERATIVE JOINT DISEASE 09/28/2007   Qualifier: Diagnosis of  By: Lenna Gilford MD, Deborra Medina    DIVERTICULOSIS OF COLON 09/28/2007   Qualifier: Diagnosis of  By: Lenna Gilford MD, Deborra Medina    GERD (gastroesophageal reflux disease)    HEMORRHOIDS, INTERNAL 10/15/2006   Qualifier: Diagnosis of  By: Lenna Gilford MD, Deborra Medina    Hyperlipidemia    Hypertension    Irritable bowel syndrome 10/15/2006   Qualifier: Diagnosis of  By: Lenna Gilford MD, Deborra Medina     Past Surgical History:  Procedure Laterality Date   COLONOSCOPY     DENTAL SURGERY     POLYPECTOMY     VAGINAL HYSTERECTOMY      Family History  Problem Relation Age of Onset    Heart disease Mother    Kidney disease Father      Current Outpatient Medications:    acetaminophen (TYLENOL) 500 MG tablet, Take 500 mg by mouth every 6 (six) hours as needed., Disp: , Rfl:    amLODipine (NORVASC) 5 MG tablet, Take 1 tablet by mouth once daily, Disp: 90 tablet, Rfl: 1   atenolol (TENORMIN) 50 MG tablet, Take 1 tablet (50 mg total) by mouth daily., Disp: 90 tablet, Rfl: 0   atorvastatin (LIPITOR) 20 MG tablet, Take 1 tablet (20 mg total) by mouth daily., Disp: 90 tablet, Rfl: 1   donepezil (ARICEPT) 5 MG tablet, Take 1 tablet (5 mg total) by mouth at bedtime., Disp: 90 tablet, Rfl: 1   famotidine (PEPCID) 20 MG tablet, Take 1 tablet (20 mg total) by mouth 2 (two) times daily., Disp: 60 tablet, Rfl: 0   fluticasone (FLONASE) 50 MCG/ACT nasal spray, USE TWO SPRAY(S) IN EACH NOSTRIL ONCE DAILY, Disp: 16 g, Rfl: 3   fluticasone (FLOVENT HFA) 44 MCG/ACT inhaler, Inhale 2 puffs into the lungs as needed., Disp: 1 Inhaler, Rfl: 2   losartan-hydrochlorothiazide (HYZAAR) 100-12.5 MG tablet, Take 1 tablet by mouth once daily, Disp: 90 tablet, Rfl: 1   meclizine (ANTIVERT) 25 MG tablet, Take 1 tablet (25 mg total) by mouth 3 (three) times daily as needed for dizziness., Disp: 30 tablet, Rfl: 0   molnupiravir EUA (LAGEVRIO)  200 mg CAPS capsule, Take 4 capsules (800 mg total) by mouth 2 (two) times daily for 5 days., Disp: 40 capsule, Rfl: 0   nitrofurantoin, macrocrystal-monohydrate, (MACROBID) 100 MG capsule, Take 1 capsule (100 mg total) by mouth 2 (two) times daily., Disp: 10 capsule, Rfl: 0   omeprazole (PRILOSEC) 20 MG capsule, Take 1 capsule (20 mg total) by mouth daily., Disp: 30 capsule, Rfl: 3   potassium chloride (KLOR-CON M) 10 MEQ tablet, Take 1 tablet (10 mEq total) by mouth daily., Disp: 90 tablet, Rfl: 1  EXAM:  VITALS per patient if applicable: RR between 56-70 bpm  GENERAL: alert, oriented, appears well and in no acute distress  HEENT: atraumatic, conjunctiva clear, no  obvious abnormalities on inspection of external nose and ears  NECK: normal movements of the head and neck  LUNGS: on inspection no signs of respiratory distress, breathing rate appears normal, no obvious gross SOB, gasping or wheezing  CV: no obvious cyanosis  MS: moves all visible extremities without noticeable abnormality  PSYCH/NEURO: pleasant and cooperative, no obvious depression or anxiety, speech and thought processing grossly intact  ASSESSMENT AND PLAN:  Discussed the following assessment and plan:  COVID-19 virus infection -Symptoms starting 03/31/21 with at home COVID test the same day positive test -Though symptoms appear relatively mild at this time, given risk factors discussed r/b/a to antiviral medications. -Patient wishes to start molnupiravir. -Continue treatment of symptoms with OTC cough/cold medications, gargling with warm salt water Chloraseptic spray, steam shower, antihistamines, hydration, rest, Cetera -Given strict precautions  - Plan: molnupiravir EUA (LAGEVRIO) 200 mg CAPS capsule  Follow-up as needed  I discussed the assessment and treatment plan with the patient. The patient was provided an opportunity to ask questions and all were answered. The patient agreed with the plan and demonstrated an understanding of the instructions.   The patient was advised to call back or seek an in-person evaluation if the symptoms worsen or if the condition fails to improve as anticipated.   Billie Ruddy, MD

## 2021-04-01 NOTE — Patient Instructions (Signed)
Visit Information  Thank you for taking time to visit with me today. Please don't hesitate to contact me if I can be of assistance to you before our next scheduled telephone appointment.  Following are the goals we discussed today:  Patient Goals/Self-Care Activities: Over the next 120 days Attend scheduled appointments with providers Utilize healthy coping skills and/or supportive resources provided Contact PCP office with any questions or concerns  Our next appointment is by telephone on 04/05/21 at 10:00 AM  Please call the care guide team at [phone] if you need to cancel or reschedule your appointment.   If you are experiencing a Mental Health or [place] or need someone to talk to, please call the [place] National Suicide Prevention Lifeline: 956-057-3758 or TTY: 973-309-6228 TTY (267) 809-8937) to talk to a trained counselor call 911   The patient verbalized understanding of instructions, educational materials, and care plan provided today and declined offer to receive copy of patient instructions, educational materials, and care plan.   [name], MSW, [email or web address] Phone [phone] 7:44 AM

## 2021-04-03 ENCOUNTER — Telehealth: Payer: Self-pay | Admitting: Pharmacist

## 2021-04-03 NOTE — Chronic Care Management (AMB) (Signed)
° ° °  Chronic Care Management Pharmacy Assistant   Name: ALLYNA PITTSLEY  MRN: [id number] DOB: Jul 09, 1930  Reason for Encounter: Reschedule appointment with Jeni Salles. Appointment rescheduled with patients daughter [name] to 07/17/2021.   [place] Pharmacist Assistant [phone]

## 2021-04-05 ENCOUNTER — Ambulatory Visit: Payer: PPO | Admitting: Licensed Clinical Social Worker

## 2021-04-05 DIAGNOSIS — M1991 Primary osteoarthritis, unspecified site: Secondary | ICD-10-CM

## 2021-04-05 DIAGNOSIS — I10 Essential (primary) hypertension: Secondary | ICD-10-CM

## 2021-04-05 NOTE — Chronic Care Management (AMB) (Signed)
° °   Clinical Social Work  Care Management   Phone Outreach    04/05/2021 Name: Rhonda Weaver MRN: [id number] DOB: [birth year]  Rhonda Weaver is a 88 y.o. year old female who is a primary care patient of Koberlein, Steele Berg, MD .   Reason for referral: Mental Health Counseling and Resources and Caregiver Stress.    Patient's daugher unable to keep phone appointment today and requested to reschedule.  Plan:Appointment was rescheduled with CCM LCSW  Review of patient status, including review of consultants reports, relevant laboratory and other test results, and collaboration with appropriate care team members and the patient's provider was performed as part of comprehensive patient evaluation and provision of care management services.    Rhonda Weaver, MSW, [email or web address] Phone [phone] 10:28 AM

## 2021-04-09 DIAGNOSIS — E782 Mixed hyperlipidemia: Secondary | ICD-10-CM

## 2021-04-09 DIAGNOSIS — M1991 Primary osteoarthritis, unspecified site: Secondary | ICD-10-CM

## 2021-04-09 DIAGNOSIS — I10 Essential (primary) hypertension: Secondary | ICD-10-CM

## 2021-04-12 ENCOUNTER — Ambulatory Visit (INDEPENDENT_AMBULATORY_CARE_PROVIDER_SITE_OTHER): Payer: PPO

## 2021-04-12 DIAGNOSIS — E782 Mixed hyperlipidemia: Secondary | ICD-10-CM

## 2021-04-12 DIAGNOSIS — F03C11 Unspecified dementia, severe, with agitation: Secondary | ICD-10-CM

## 2021-04-12 DIAGNOSIS — R413 Other amnesia: Secondary | ICD-10-CM

## 2021-04-12 DIAGNOSIS — I10 Essential (primary) hypertension: Secondary | ICD-10-CM

## 2021-04-12 NOTE — Chronic Care Management (AMB) (Signed)
Chronic Care Management   CCM RN Visit Note  04/12/2021 Name: Rhonda Weaver MRN: [id number] DOB: [birth year]  Subjective: Rhonda Weaver is a 88 y.o. year old female who is a primary care patient of Koberlein, Steele Berg, MD. The care management team was consulted for assistance with disease management and care coordination needs.    Engaged with patient by telephone for follow up visit in response to provider referral for case management and/or care coordination services.   Consent to Services:  The patient was given information about Chronic Care Management services, agreed to services, and gave verbal consent prior to initiation of services.  Please see initial visit note for detailed documentation.   Patient agreed to services and verbal consent obtained.   Assessment: Review of patient past medical history, allergies, medications, health status, including review of consultants reports, laboratory and other test data, was performed as part of comprehensive evaluation and provision of chronic care management services.   SDOH (Social Determinants of Health) assessments and interventions performed:    CCM Care Plan  Allergies  Allergen Reactions   Fenofibrate     REACTION: pt states intolerant   Gemfibrozil     REACTION: pt states intolerant   Niacin     REACTION: pt states intolerant   Penicillins     REACTION: Hx of arm swelling$RemoveBefore' \\T'[id number]$ \ itching after shot   Sulfamethoxazole-Trimethoprim     REACTION: "deathly sick"  w/  N\T\V   Sulfonamide Derivatives     REACTION: nausea and vomiting    Outpatient Encounter Medications as of 04/12/2021  Medication Sig   acetaminophen (TYLENOL) 500 MG tablet Take 500 mg by mouth every 6 (six) hours as needed.   amLODipine (NORVASC) 5 MG tablet Take 1 tablet by mouth once daily   atenolol (TENORMIN) 50 MG tablet Take 1 tablet (50 mg total) by mouth daily.   atorvastatin (LIPITOR) 20 MG tablet Take 1 tablet (20 mg total) by mouth daily.    donepezil (ARICEPT) 5 MG tablet Take 1 tablet (5 mg total) by mouth at bedtime.   famotidine (PEPCID) 20 MG tablet Take 1 tablet (20 mg total) by mouth 2 (two) times daily.   fluticasone (FLONASE) 50 MCG/ACT nasal spray USE TWO SPRAY(S) IN EACH NOSTRIL ONCE DAILY   fluticasone (FLOVENT HFA) 44 MCG/ACT inhaler Inhale 2 puffs into the lungs as needed.   losartan-hydrochlorothiazide (HYZAAR) 100-12.5 MG tablet Take 1 tablet by mouth once daily   meclizine (ANTIVERT) 25 MG tablet Take 1 tablet (25 mg total) by mouth 3 (three) times daily as needed for dizziness.   nitrofurantoin, macrocrystal-monohydrate, (MACROBID) 100 MG capsule Take 1 capsule (100 mg total) by mouth 2 (two) times daily.   omeprazole (PRILOSEC) 20 MG capsule Take 1 capsule (20 mg total) by mouth daily.   potassium chloride (KLOR-CON M) 10 MEQ tablet Take 1 tablet (10 mEq total) by mouth daily.   No facility-administered encounter medications on file as of 04/12/2021.    Patient Active Problem List   Diagnosis Date Noted   Allergic rhinitis 09/28/2007   Osteoarthritis 09/28/2007   HYPERLIPIDEMIA, MIXED 10/15/2006   Essential hypertension 10/15/2006   GERD 10/15/2006    Conditions to be addressed/monitored:HTN, HLD, and Dementia  Care Plan : RN Care Manager Plan of Care  Updates made by Dimitri Ped, RN since 04/12/2021 12:00 AM     Problem: Chronic Disease Management and Care Coordination Needs (Dementia,HTN, osteoarthritis and HLD)   Priority: High  Long-Range Goal: Establish Plan of Care for Chronic Disease Management Needs (Dementia,HTN, osteoarthritis and HLD)   Start Date: 01/25/2021  Expected End Date: 07/24/2021  Priority: High  Note:   Current Barriers:  Knowledge Deficits related to plan of care for management of CAD, HLD, Dementia, and Osteoarthritis  Care Coordination needs related to Level of care concerns, ADL IADL limitations, Memory Deficits, Inability to perform ADL's independently, Inability  to perform IADL's independently, and Lacks knowledge of community resource: personal care services Chronic Disease Management support and education needs related to HTN, HLD, Dementia, and Osteoarthritis  Spoke with daughter [name] pt gives consent to speak with her.  States pt saw  neurology on 03/18/21 and they increased her Donepezil.  States she is giving pt her medications and making sure she is eating and drinking enough fluids. States she forgets she has eaten.  States she does give her Ensure sometimes. States she has not been taking the Citrucel pill but her bowels have been moving most days.  States that pt still confused in the afternoon and night. States she tries to keep her awake during the day but she falls asleep when she sits down  States she is not sure pt is bathing but she dresses herself.  States pt does not know her later in the day and she is restless and roams around the house.  States she has alarms on the doors so she knows if she tries to go outside  . States her  B/P has ranged from 116/59 to 151/75. RNCM Clinical Goal(s):  Patient will verbalize understanding of plan for management of HTN, HLD, Dementia, and Osteoarthritis as evidenced by voiced adherence to plan of care verbalize basic understanding of  HTN, HLD, Dementia, and Osteoarthritis disease process and self health management plan as evidenced by voiced understanding and teach back take all medications exactly as prescribed and will call provider for medication related questions as evidenced by dispense report and pt verbalization attend all scheduled medical appointments: Neurotology 06/20/21, CCM LCSW 04/19/21, labs 05/01/21 ,CCM PharmD 07/17/21 as evidenced by medical records demonstrate Improved adherence to prescribed treatment plan for HTN, HLD, Dementia, and Osteoarthritis as evidenced by readings within limits, voiced adherence to plan of care continue to work with RN Care Manager to address care management and  care coordination needs related to  HTN, HLD, Dementia, and Osteoarthritis as evidenced by adherence to CM Team Scheduled appointments work with Education officer, museum to address  related to the management of Level of care concerns, ADL IADL limitations, Memory Deficits, Inability to perform ADL's independently, Inability to perform IADL's independently, and Lacks knowledge of community resource: caregiver stress related to the management of HTN, HLD, Dementia, and Osteoarthritis as evidenced by review of EMR and patient or Education officer, museum report work with Data processing manager care guide to address needs related to  ADL IADL limitations, Memory Deficits, Inability to perform ADL's independently, Inability to perform IADL's independently, and Lacks knowledge of community resource: personal care services as evidenced by patient and/or community resource care guide support through collaboration with Consulting civil engineer, provider, and care team.   Interventions: 1:1 collaboration with primary care provider regarding development and update of comprehensive plan of care as evidenced by provider attestation and co-signature Inter-disciplinary care team collaboration (see longitudinal plan of care) Evaluation of current treatment plan related to  self management and patient's adherence to plan as established by provider   Hyperlipidemia Interventions:  (Status:  Condition stable.  Not addressed this  visit.) Long Term Goal Medication review performed; medication list updated in electronic medical record.  Provider established cholesterol goals reviewed Counseled on importance of regular laboratory monitoring as prescribed Reviewed role and benefits of statin for ASCVD risk reduction  Hypertension Interventions:  (Status:  Goal on track:  Yes.) Long Term Goal Last practice recorded BP readings:  BP Readings from Last 3 Encounters:  04/01/21 (!) 116/59  03/18/21 129/61  03/15/21 130/62  Most recent eGFR/CrCl: No results  found for: EGFR  No components found for: CRCL  Evaluation of current treatment plan related to hypertension self management and patient's adherence to plan as established by provider Provided education to patient re: stroke prevention, s/s of heart attack and stroke Reviewed medications with patient and discussed importance of compliance Discussed plans with patient for ongoing care management follow up and provided patient with direct contact information for care management team Advised patient, providing education and rationale, to monitor blood pressure daily and record, calling PCP for findings outside established parameters Provided education on prescribed diet low sodium Discussed complications of poorly controlled blood pressure such as heart disease, stroke, circulatory complications, vision complications, kidney impairment, sexual dysfunction Reviewed importance of keeping B/P in range to help with overall health and  dementia.  Reinforced goals for B/P and to call provider if pt has lower readings with symptoms   Dementia:  (Status:  Goal on track:  Yes.)  Long Term Goal Evaluation of current treatment plan related to misuse of: with behavioral disturbance Reviewed medications including Aricept , Emotional Support Provided to patient/caregiver, Sleep hygiene recommendations and education provided, Consideration of in-home help encouraged , Referred to LCSW for assistance with Guardianship discussion, Discussed importance of attendance to all provider appointments, and  Reinforced to get the Citrucel powder for the pt to drink or Bene fiber to mix in her other fluids if pt will not drink the Citrucel. Reviewed to find things for pt to do during the day to keep her awake.  Reinforced to try to redirect pt when she tries to leave. Reinforced importance of pt drinking adequate amounts of fluids and taking medications as directed, Discussed with caregiver ways to handle stress and care of  pt  Patient Goals/Self-Care Activities: Take all medications as prescribed Attend all scheduled provider appointments Call provider office for new concerns or questions  check blood pressure 3 times per week choose a place to take my blood pressure (home, clinic or office, retail store) take blood pressure log to all doctor appointments begin an exercise program eat more whole grains, fruits and vegetables, lean meats and healthy fats limit salt intake to $RemoveB'2300mg'nfpitGQk$ /day call for medicine refill 2 or 3 days before it runs out take all medications exactly as prescribed call doctor with any symptoms you believe are related to your medicine  Follow Up Plan:  Telephone follow up appointment with care management team member scheduled for:  05/14/21 The patient has been provided with contact information for the care management team and has been advised to call with any health related questions or concerns.       Plan:Telephone follow up appointment with care management team member scheduled for:  05/14/21 The patient has been provided with contact information for the care management team and has been advised to call with any health related questions or concerns.  Peter Garter RN, Jackquline Denmark, CDE Care Management Coordinator [facility] [phone]

## 2021-04-12 NOTE — Patient Instructions (Signed)
Visit Information ? ?Thank you for taking time to visit with me today. Please don't hesitate to contact me if I can be of assistance to you before our next scheduled telephone appointment. ? ?Following are the goals we discussed today:  ?Take all medications as prescribed ?Attend all scheduled provider appointments ?Call provider office for new concerns or questions  ?check blood pressure 3 times per week ?choose a place to take my blood pressure (home, clinic or office, retail store) ?take blood pressure log to all doctor appointments ?begin an exercise program ?eat more whole grains, fruits and vegetables, lean meats and healthy fats ?limit salt intake to 2300mg /day ?call for medicine refill 2 or 3 days before it runs out ?take all medications exactly as prescribed ?call doctor with any symptoms you believe are related to your medicine ? ?Our next appointment is by telephone on 05/14/21 at 10:45 AM ? ?Please call the care guide team at [phone] if you need to cancel or reschedule your appointment.  ? ?If you are experiencing a Mental Health or [place] or need someone to talk to, please call the Suicide and Crisis Lifeline: 988 ?call the [place] National Suicide Prevention Lifeline: 225 810 3699 or TTY: (540)574-5680 TTY (218)082-3624) to talk to a trained counselor ?call 1-800-273-TALK (toll free, 24 hour hotline) ?go to [facility] [address], [place] [phone]) ?call 911  ? ?The patient verbalized understanding of instructions, educational materials, and care plan provided today and agreed to receive a mailed copy of patient instructions, educational materials, and care plan.  ?Peter Garter RN, BSN,CCM, CDE ?Care Management Coordinator ?[facility] ?[phone]   ?

## 2021-04-19 ENCOUNTER — Ambulatory Visit: Payer: PPO | Admitting: Licensed Clinical Social Worker

## 2021-04-19 DIAGNOSIS — I10 Essential (primary) hypertension: Secondary | ICD-10-CM

## 2021-04-19 DIAGNOSIS — M1991 Primary osteoarthritis, unspecified site: Secondary | ICD-10-CM

## 2021-04-22 ENCOUNTER — Other Ambulatory Visit: Payer: Self-pay | Admitting: Family Medicine

## 2021-04-22 DIAGNOSIS — I10 Essential (primary) hypertension: Secondary | ICD-10-CM

## 2021-04-23 NOTE — Patient Instructions (Signed)
Visit Information ? ?Thank you for taking time to visit with me today. Please don't hesitate to contact me if I can be of assistance to you before our next scheduled telephone appointment. ? ?Following are the goals we discussed today:  ?Patient Goals/Self-Care Activities: Over the next 120 days ?Attend scheduled appointments with providers ?Utilize healthy coping skills and/or supportive resources provided ?Contact PCP office with any questions or concerns ? ?Our next appointment is by telephone on 05/31/21 at 10:45 AM ? ?Please call the care guide team at [phone] if you need to cancel or reschedule your appointment.  ? ?If you are experiencing a Mental Health or [place] or need someone to talk to, please call the Suicide and Crisis Lifeline: 988 ?call 911  ? ?The patient verbalized understanding of instructions, educational materials, and care plan provided today and declined offer to receive copy of patient instructions, educational materials, and care plan.  ? ?[name], MSW, LCSW ?[place] [facility] ?[facility] ?[email or web address] ?Phone [phone] ?9:01 AM ?  ?

## 2021-04-24 NOTE — Chronic Care Management (AMB) (Signed)
?Chronic Care Management  ? ? Clinical Social Work Note ? ?04/24/2021 ?Name: Rhonda Weaver MRN: [id number] DOB: 08-Apr-1930 ? ?Rhonda Weaver is a 88 y.o. year old female who is a primary care patient of Koberlein, Steele Berg, MD. The CCM team was consulted to assist the patient with chronic disease management and/or care coordination needs related to: Caregiver Stress.  ? ?Engaged with patient's daughter by telephone for follow up visit in response to provider referral for social work chronic care management and care coordination services.  ? ?Consent to Services:  ?The patient was given information about Chronic Care Management services, agreed to services, and gave verbal consent prior to initiation of services.  Please see initial visit note for detailed documentation.  ? ?Patient agreed to services and consent obtained.  ? ?Assessment: Review of patient past medical history, allergies, medications, and health status, including review of relevant consultants reports was performed today as part of a comprehensive evaluation and provision of chronic care management and care coordination services.    ? ?SDOH (Social Determinants of Health) assessments and interventions performed:   ? ?Advanced Directives Status: Not addressed in this encounter. ? ?CCM Care Plan ? ?Allergies  ?Allergen Reactions  ? Fenofibrate   ?  REACTION: pt states intolerant  ? Gemfibrozil   ?  REACTION: pt states intolerant  ? Niacin   ?  REACTION: pt states intolerant  ? Penicillins   ?  REACTION: Hx of arm swelling' \\T'$ \ itching after shot  ? Sulfamethoxazole-Trimethoprim   ?  REACTION: "deathly sick"  w/  N\T\V  ? Sulfonamide Derivatives   ?  REACTION: nausea and vomiting  ? ? ?Outpatient Encounter Medications as of 04/19/2021  ?Medication Sig  ? acetaminophen (TYLENOL) 500 MG tablet Take 500 mg by mouth every 6 (six) hours as needed.  ? amLODipine (NORVASC) 5 MG tablet Take 1 tablet by mouth once daily  ? atorvastatin (LIPITOR) 20 MG tablet  Take 1 tablet (20 mg total) by mouth daily.  ? donepezil (ARICEPT) 5 MG tablet Take 1 tablet (5 mg total) by mouth at bedtime.  ? famotidine (PEPCID) 20 MG tablet Take 1 tablet (20 mg total) by mouth 2 (two) times daily.  ? fluticasone (FLONASE) 50 MCG/ACT nasal spray USE TWO SPRAY(S) IN EACH NOSTRIL ONCE DAILY  ? fluticasone (FLOVENT HFA) 44 MCG/ACT inhaler Inhale 2 puffs into the lungs as needed.  ? losartan-hydrochlorothiazide (HYZAAR) 100-12.5 MG tablet Take 1 tablet by mouth once daily  ? meclizine (ANTIVERT) 25 MG tablet Take 1 tablet (25 mg total) by mouth 3 (three) times daily as needed for dizziness.  ? nitrofurantoin, macrocrystal-monohydrate, (MACROBID) 100 MG capsule Take 1 capsule (100 mg total) by mouth 2 (two) times daily.  ? omeprazole (PRILOSEC) 20 MG capsule Take 1 capsule (20 mg total) by mouth daily.  ? potassium chloride (KLOR-CON M) 10 MEQ tablet Take 1 tablet (10 mEq total) by mouth daily.  ? [DISCONTINUED] atenolol (TENORMIN) 50 MG tablet Take 1 tablet (50 mg total) by mouth daily.  ? ?No facility-administered encounter medications on file as of 04/19/2021.  ? ? ?Patient Active Problem List  ? Diagnosis Date Noted  ? Allergic rhinitis 09/28/2007  ? Osteoarthritis 09/28/2007  ? HYPERLIPIDEMIA, MIXED 10/15/2006  ? Essential hypertension 10/15/2006  ? GERD 10/15/2006  ? ? ?Conditions to be addressed/monitored: HTN and Osteoarthritis; Caregiver Stress ? ?Care Plan : LCSW Plan of Care  ?Updates made by Rebekah Chesterfield, LCSW since 04/24/2021 12:00 AM  ?  ? ?  Problem: Quality of Life (General Plan of Care)   ?  ? ?Long-Range Goal: Quality of Life Maintained   ?Start Date: 03/07/2021  ?This Visit's Progress: On track  ?Recent Progress: On track  ?Priority: High  ?Note:   ?Current Barriers:   ?Patient unable to consistently perform activities of daily living and needs assistance in order to meet this unmet needs ?Clinical Goals: Patient will work with CCM LCSW to address needs related to management of  health conditions to promote well-being  ?Clinical Interventions: ?Assessment of needs, barriers , agencies contacted, as well as how stressors are negatively impacting functioning. All hx provided by patient's daughter, [name] ?Patient has resided with adult daughter for approx 2 months. She has been observed to be very active at night and continues to endorse memory concerns throughout the day, often forgetting that she ate or taken her daily meds. She has brief episodes of sadness related to the recent loss of a close friend 2/10: Strategies to enhance safety at night, when patient is awake identified 3/10: Pt's daughter endorses a decrease in agitation from pt and shared, "things are going pretty good" States continued difficulty with bathing pt and is requesting a bath aid ?Patient has been observed to have balance concerns; however, she is not willing to utilize a cane to promote balance. No recent falls ?Pt has lost eight pounds since last PCP visit; however, BP readings have been within range ?Daughter has received suggestions from Neurologist to assist in pt care and improving medication compliance ?Patient receives support from family. She is not currently open to aid services ?CCM LCSW discussed various options to promote safety and strengthen support system. The process regarding placement and/or obtaining in home aids discussed. LCSW will mail supportive documentation to address on file ?Family were provided education on Personal Care Service process from RN CM, per review of chart. Patient would need Medicaid and will be contacted by Care Guide to assist with application for [facility] services 2/10: Daughter has received paperwork from [facility] and will review in the next few days ?Review various resources, discussed options and provided patient information ?Active listening / Reflection utilized  ?Emotional Support Provided ?Provided psychoeducation for mental health needs  ?Reviewed mental health  medications and discussed importance of compliance: Patient refuses to take Aricept at night, as prescribed. Daughter will address with PCP at upcoming PCP appt scheduled for 03/08/21 ?Caregiver stress acknowledged  ?Consideration of in-home help encouraged : options discussed ?Verbalization of feelings encouraged  ?Discussed caregiver resources and support:   ?1:1 collaboration with primary care provider regarding development and update of comprehensive plan of care as evidenced by provider attestation and co-signature ?Inter-disciplinary care team collaboration (see longitudinal plan of care) ?Patient Goals/Self-Care Activities: Over the next 120 days ?Attend scheduled appointments with providers ?Utilize healthy coping skills and/or supportive resources provided ?Contact PCP office with any questions or concerns ? ?  ?  ? ?[name], MSW, LCSW ?[facility] ?[facility] ?[email or web address] ?Phone [phone] ?8:33 AM ? ? ? ?

## 2021-05-01 ENCOUNTER — Other Ambulatory Visit (INDEPENDENT_AMBULATORY_CARE_PROVIDER_SITE_OTHER): Payer: PPO

## 2021-05-01 DIAGNOSIS — E782 Mixed hyperlipidemia: Secondary | ICD-10-CM

## 2021-05-01 LAB — LIPID PANEL
Cholesterol: 176 mg/dL (ref 0–200)
HDL: 44 mg/dL (ref >39.00)
LDL Cholesterol: 92 mg/dL (ref 0–99)
NonHDL: 131.8
Total CHOL/HDL Ratio: 4
Triglycerides: 200 mg/dL — ABNORMAL HIGH (ref 0.0–149.0)
VLDL: 40 mg/dL (ref 0.0–40.0)

## 2021-05-07 ENCOUNTER — Emergency Department (HOSPITAL_COMMUNITY): Payer: PPO

## 2021-05-07 ENCOUNTER — Emergency Department (HOSPITAL_COMMUNITY)
Admission: EM | Admit: 2021-05-07 | Discharge: 2021-05-07 | Disposition: A | Discharge status: Home / Self Care | Payer: PPO | Attending: Emergency Medicine | Admitting: Emergency Medicine

## 2021-05-07 ENCOUNTER — Encounter (HOSPITAL_COMMUNITY): Payer: Self-pay | Admitting: Pharmacy Technician

## 2021-05-07 ENCOUNTER — Other Ambulatory Visit: Payer: Self-pay

## 2021-05-07 DIAGNOSIS — R42 Dizziness and giddiness: Secondary | ICD-10-CM | POA: Diagnosis not present

## 2021-05-07 DIAGNOSIS — R262 Difficulty in walking, not elsewhere classified: Secondary | ICD-10-CM | POA: Diagnosis not present

## 2021-05-07 DIAGNOSIS — Z79899 Other long term (current) drug therapy: Secondary | ICD-10-CM | POA: Insufficient documentation

## 2021-05-07 DIAGNOSIS — G319 Degenerative disease of nervous system, unspecified: Secondary | ICD-10-CM | POA: Diagnosis not present

## 2021-05-07 DIAGNOSIS — I6782 Cerebral ischemia: Secondary | ICD-10-CM | POA: Diagnosis not present

## 2021-05-07 LAB — URINALYSIS, ROUTINE W REFLEX MICROSCOPIC
Bilirubin Urine: NEGATIVE
Glucose, UA: NEGATIVE mg/dL
Hgb urine dipstick: NEGATIVE
Ketones, ur: NEGATIVE mg/dL
Leukocytes,Ua: NEGATIVE
Nitrite: NEGATIVE
Protein, ur: NEGATIVE mg/dL
Specific Gravity, Urine: 1.019 (ref 1.005–1.030)
pH: 6 (ref 5.0–8.0)

## 2021-05-07 LAB — CBC
HCT: 42.2 % (ref 36.0–46.0)
Hemoglobin: 14.1 g/dL (ref 12.0–15.0)
MCH: 32.8 pg (ref 26.0–34.0)
MCHC: 33.4 g/dL (ref 30.0–36.0)
MCV: 98.1 fL (ref 80.0–100.0)
Platelets: 266 10*3/uL (ref 150–400)
RBC: 4.3 MIL/uL (ref 3.87–5.11)
RDW: 12.6 % (ref 11.5–15.5)
WBC: 6.7 10*3/uL (ref 4.0–10.5)
nRBC: 0 % (ref 0.0–0.2)

## 2021-05-07 LAB — BASIC METABOLIC PANEL
Anion gap: 8 (ref 5–15)
BUN: 15 mg/dL (ref 8–23)
CO2: 31 mmol/L (ref 22–32)
Calcium: 9.6 mg/dL (ref 8.9–10.3)
Chloride: 97 mmol/L — ABNORMAL LOW (ref 98–111)
Creatinine, Ser: 0.91 mg/dL (ref 0.44–1.00)
GFR, Estimated: >60 mL/min (ref >60)
Glucose, Bld: 128 mg/dL — ABNORMAL HIGH (ref 70–99)
Potassium: 4 mmol/L (ref 3.5–5.1)
Sodium: 136 mmol/L (ref 135–145)

## 2021-05-07 LAB — CBG MONITORING, ED: Glucose-Capillary: 118 mg/dL — ABNORMAL HIGH (ref 70–99)

## 2021-05-07 MED ORDER — MECLIZINE HCL 25 MG PO TABS
12.5000 mg | ORAL_TABLET | Freq: Once | ORAL | Status: AC
  Administered 2021-05-07: 12.5 mg via ORAL
  Filled 2021-05-07: qty 1

## 2021-05-07 MED ORDER — MECLIZINE HCL 12.5 MG PO TABS
12.5000 mg | ORAL_TABLET | Freq: Three times a day (TID) | ORAL | 0 refills | Status: DC | PRN
Start: 2021-05-07 — End: 2021-07-17

## 2021-05-07 MED ORDER — SODIUM CHLORIDE 0.9 % IV BOLUS
1000.0000 mL | Freq: Once | INTRAVENOUS | Status: AC
  Administered 2021-05-07: 1000 mL via INTRAVENOUS

## 2021-05-07 NOTE — ED Provider Notes (Signed)
5:16 PM ?Care assumed from Dr. Vanita Panda.  At time of transfer care, patient is waiting for results of MRI to be completed.  Patient has had unsteadiness and dizziness and MRI was ordered to rule out acute stroke.  If MRI does not show acute stroke, plan of care be to discharge with prescription for lower dose meclizine and PCP follow-up.   ? ?MRI just returned showing no evidence of acute stroke although was terminated early there was no stroke seen.  Per previous plan, will give prescription for meclizine and patient be discharged home.  ? ?5:57 PM ?Patient reassessed.  Family agrees with plan of care with discharge to follow-up with PCP in the neck several days and if symptoms were to worsen and she truly becomes unable to be safe living at home with family for falls, they understand return precautions and follow-up instructions.  Patient be given prescription meclizine and encouragement to stay hydrated. ? ?Patient discharged in good condition.  ? ? ?Clinical Impression: ?1. Dizziness   ? ? ?Disposition: Discharge ? ?Condition: Good ? ?I have discussed the results, Dx and Tx plan with the pt(& family if present). He/she/they expressed understanding and agree(s) with the plan. Discharge instructions discussed at great length. Strict return precautions discussed and pt &/or family have verbalized understanding of the instructions. No further questions at time of discharge.  ? ? ?New Prescriptions  ? MECLIZINE (ANTIVERT) 12.5 MG TABLET    Take 1 tablet (12.5 mg total) by mouth 3 (three) times daily as needed for dizziness.  ? ? ?Follow Up: ?Caren Macadam, MD ?[place] ?[place] [address] ?[phone] ? ? ? ? ?[place] ?[address] ?[phone] ?[place] ?[phone] ? ? ? ? ? ? ?  ?Kashon Kraynak, Gwenyth Allegra, MD ?05/07/21 1800 ? ?

## 2021-05-07 NOTE — ED Notes (Signed)
Unable to ambulate earlier when getting up to [facility], unsteady on feet, needed assistance, reported dizziness.  ?

## 2021-05-07 NOTE — ED Notes (Signed)
Pt to MRI

## 2021-05-07 NOTE — ED Notes (Signed)
Ambulatory with assistance, unsteady on feet, back to stretcher, IV re-positioned and flushed.  ?

## 2021-05-07 NOTE — ED Notes (Signed)
;  pt first attempt unsuccessful, pt has label specimen cup  ? ?

## 2021-05-07 NOTE — ED Notes (Signed)
Pt asked to provide urine sample. Pt made attempt but unable to urinate. Will attempt again later  ?

## 2021-05-07 NOTE — Discharge Instructions (Signed)
Your history, exam, work-up today are consistent with vertigo causing her unsteadiness and difficulty ambulation.  The imaging including with CT and MRI today did not show evidence of acute stroke or other acute intracranial abnormality.  Your lab work and urine were also reassuring overall.  We feel you are safe for discharge home at this time however please be very careful to stay hydrated and use the vertigo medicine and avoid other falls.  Please follow-up with your primary doctor in the next several days.  If any symptoms are to change or worsen, please return to the nearest Emergency Department. ?

## 2021-05-07 NOTE — ED Triage Notes (Addendum)
Pt here POV with reports of dizziness and difficulty ambulating when waking up today. Pt also had an episode of the same yesterday. No facial droop or unilateral weakness noted.  ?

## 2021-05-07 NOTE — ED Notes (Signed)
Attempted to make L AC IV more comfortable. Offered ACE wrap and ice pack. Rationale for keeping explained. Site unremarkable, and patent.  ?

## 2021-05-08 ENCOUNTER — Telehealth: Payer: Self-pay | Admitting: Family Medicine

## 2021-05-08 NOTE — Telephone Encounter (Signed)
yes

## 2021-05-08 NOTE — Telephone Encounter (Signed)
Spoke with the patient's daughter and scheduled an appt on 4/3 to arrive at 8:15am instead due to 8:30am cancellation.   ?

## 2021-05-08 NOTE — Telephone Encounter (Signed)
Pt daughter [name] is calling and her mother went to [place] yesterday and needs post er fup. Dr Ethlyn Gallery next opening is may. Please advise ?

## 2021-05-10 DIAGNOSIS — M1991 Primary osteoarthritis, unspecified site: Secondary | ICD-10-CM | POA: Diagnosis not present

## 2021-05-10 DIAGNOSIS — E782 Mixed hyperlipidemia: Secondary | ICD-10-CM | POA: Diagnosis not present

## 2021-05-10 DIAGNOSIS — F03C11 Unspecified dementia, severe, with agitation: Secondary | ICD-10-CM | POA: Diagnosis not present

## 2021-05-10 DIAGNOSIS — I10 Essential (primary) hypertension: Secondary | ICD-10-CM

## 2021-05-13 ENCOUNTER — Encounter: Payer: Self-pay | Admitting: Family Medicine

## 2021-05-13 ENCOUNTER — Ambulatory Visit (INDEPENDENT_AMBULATORY_CARE_PROVIDER_SITE_OTHER): Payer: HMO | Admitting: Family Medicine

## 2021-05-13 VITALS — BP 140/80 | HR 53 | Temp 97.7°F | Ht 61.0 in | Wt 112.6 lb

## 2021-05-13 DIAGNOSIS — G301 Alzheimer's disease with late onset: Secondary | ICD-10-CM

## 2021-05-13 DIAGNOSIS — F0390 Unspecified dementia without behavioral disturbance: Secondary | ICD-10-CM | POA: Insufficient documentation

## 2021-05-13 DIAGNOSIS — I951 Orthostatic hypotension: Secondary | ICD-10-CM

## 2021-05-13 DIAGNOSIS — F02B0 Dementia in other diseases classified elsewhere, moderate, without behavioral disturbance, psychotic disturbance, mood disturbance, and anxiety: Secondary | ICD-10-CM

## 2021-05-13 DIAGNOSIS — R42 Dizziness and giddiness: Secondary | ICD-10-CM

## 2021-05-13 DIAGNOSIS — I10 Essential (primary) hypertension: Secondary | ICD-10-CM

## 2021-05-13 MED ORDER — ATENOLOL 50 MG PO TABS: 25.0000 mg | ORAL_TABLET | Freq: Every day | ORAL | 0 refills | Status: DC

## 2021-05-13 NOTE — Patient Instructions (Addendum)
Decrease atenolol to 1/2 tablet ('25mg'$ ) daily. Continue to monitor at home. Let me know if diastolic pressure (bottom number) is still regularly below 65 when you check.  ? ? ?Ok to continue meclizine 2-3 times daily as needed. You could use a little more regularly in the next couple of weeks, esp if starting therapy.  ?

## 2021-05-13 NOTE — Progress Notes (Signed)
?Rhonda Weaver ?DOB: [birth year] ?Encounter date: 05/13/2021 ? ?This is a 88 y.o. female who presents with ?Chief Complaint  ?Patient presents with  ? Follow-up  ?  Patient complains of recurrent dizziness last night, taking Meclizine with relief  ? ? ?History of present illness: ?Patient was seen in the emergency room on 05/07/2021.  She presented with dizziness and difficulty ambulating.  She had a CT head with no acute changes followed by an MRI of the brain with no acute changes (still demonstrating moderately advanced chronic small vessel ischemic disease).  Patient was treated with meclizine. ? ?Patient last visited with neurology on 03/18/2021.  Increase donepezil to 10 mg nightly at that time.  Recommended strong control of cardiovascular risk factors. ? ?Had spell last night - noted when laying down (per daughter). Increased confusion last night about time - was around 6pm, but thought it was bedtime.  ? ?Dizziness comes with laying down, sitting up. SIL caught her in hall - couldn't walk or sit with the severity of dizziness. Couldn't lay down because it was making things worse. Meclizine does help. Did take it last night but then started with dizziness after taking this. Spells can last for hours when they occur. Doesn't report changes in vision. Per daughter did complain of headache with one of spells, but not all spells. (Hx of personal treatment with dramamine for dizziness) ? ?She did have fall at home- was following duaghter and was going up steps and usually hold on to wall - but just turned over and hit concrete - left side of head, small bruise on arm. This was on 3/16. Also fell in living room - was trying to get heating pad unplugged and she was leaning on recliner that was rocking.  ? ?Talking to self (or someone - according to daughter) but patient denies seeing anyone that is not there. Happens a lot during day, some at night.  ? ?Has appointment with pulmonary on 13th. Daugther states that  breathing has been doing ok.  ? ?Bp at home has been in low 100's and sometimes over 50's, HR in 60's.  ? ? ? ?Allergies  ?Allergen Reactions  ? Fenofibrate   ?  REACTION: pt states intolerant  ? Gemfibrozil   ?  REACTION: pt states intolerant  ? Niacin   ?  REACTION: pt states intolerant  ? Penicillins   ?  REACTION: Hx of arm swelling' \\T'$ \ itching after shot  ? Sulfamethoxazole-Trimethoprim   ?  REACTION: "deathly sick"  w/  N\T\V  ? Sulfonamide Derivatives   ?  REACTION: nausea and vomiting  ? ?Current Meds  ?Medication Sig  ? acetaminophen (TYLENOL) 500 MG tablet Take 500 mg by mouth every 6 (six) hours as needed for mild pain or headache.  ? amLODipine (NORVASC) 5 MG tablet Take 1 tablet by mouth once daily (Patient taking differently: Take 5 mg by mouth daily.)  ? atorvastatin (LIPITOR) 20 MG tablet Take 1 tablet (20 mg total) by mouth daily.  ? donepezil (ARICEPT) 5 MG tablet Take 1 tablet (5 mg total) by mouth at bedtime. (Patient taking differently: Take 10 mg by mouth daily.)  ? Ensure (ENSURE) Take 237 mLs by mouth 2 (two) times daily between meals.  ? fluticasone (FLONASE) 50 MCG/ACT nasal spray USE TWO SPRAY(S) IN EACH NOSTRIL ONCE DAILY (Patient taking differently: Place 2 sprays into both nostrils daily as needed for allergies.)  ? fluticasone (FLOVENT HFA) 44 MCG/ACT inhaler Inhale 2 puffs into the  lungs as needed. (Patient taking differently: Inhale 2 puffs into the lungs daily as needed (shortness of breath).)  ? losartan-hydrochlorothiazide (HYZAAR) 100-12.5 MG tablet Take 1 tablet by mouth once daily  ? meclizine (ANTIVERT) 12.5 MG tablet Take 1 tablet (12.5 mg total) by mouth 3 (three) times daily as needed for dizziness.  ? meclizine (ANTIVERT) 25 MG tablet Take 1 tablet (25 mg total) by mouth 3 (three) times daily as needed for dizziness.  ? omeprazole (PRILOSEC) 20 MG capsule Take 1 capsule (20 mg total) by mouth daily.  ? potassium chloride (KLOR-CON M) 10 MEQ tablet Take 1 tablet (10 mEq  total) by mouth daily.  ? Propylene Glycol (SYSTANE BALANCE) 0.6 % SOLN Place 1 drop into both eyes daily as needed (dry eyes).  ? psyllium (REGULOID) 0.52 g capsule Take 0.52 g by mouth 3 (three) times a week.  ? [DISCONTINUED] atenolol (TENORMIN) 50 MG tablet Take 1 tablet by mouth once daily (Patient taking differently: Take 50 mg by mouth daily.)  ? ? ?Review of Systems  ?Constitutional:  Negative for chills, fatigue and fever.  ?Respiratory:  Negative for cough, chest tightness, shortness of breath and wheezing.   ?Cardiovascular:  Negative for chest pain, palpitations and leg swelling.  ?Skin:   ?     Sore on left side scalp that occurred with prior fall; patient continues to pick at scab  ?Neurological:  Positive for dizziness. Headaches: occasional. ?Psychiatric/Behavioral:  Positive for confusion, decreased concentration and sleep disturbance (sleep schedule is disrupted). The patient is not nervous/anxious.   ? ?Objective: ? ?BP 140/80 (BP Location: Left Arm, Patient Position: Sitting, Cuff Size: Normal)   Pulse (!) 53   Temp 97.7 ?F (36.5 ?C) (Oral)   Ht '5\' 1"'$  (1.549 m)   Wt 112 lb 9.6 oz (51.1 kg)   SpO2 98%   BMI 21.28 kg/m?   Weight: 112 lb 9.6 oz (51.1 kg)  ? ?BP Readings from Last 3 Encounters:  ?05/13/21 140/80  ?05/07/21 (!) 151/124  ?04/01/21 (!) 116/59  ? ?Wt Readings from Last 3 Encounters:  ?05/13/21 112 lb 9.6 oz (51.1 kg)  ?03/18/21 116 lb (52.6 kg)  ?03/15/21 117 lb 3.2 oz (53.2 kg)  ? ? ?Physical Exam ?Constitutional:   ?   General: She is not in acute distress. ?   Appearance: She is well-developed.  ?Cardiovascular:  ?   Rate and Rhythm: Normal rate and regular rhythm.  ?   Heart sounds: Murmur heard.  ?Systolic murmur is present with a grade of 2/6.  ?  No friction rub.  ?Pulmonary:  ?   Effort: Pulmonary effort is normal. No respiratory distress.  ?   Breath sounds: Normal breath sounds. No wheezing or rales.  ?Musculoskeletal:  ?   Right lower leg: No edema.  ?   Left lower leg:  No edema.  ?Neurological:  ?   Mental Status: She is alert and oriented to person, place, and time.  ?Psychiatric:     ?   Mood and Affect: Mood is anxious (little anxious today; wanting to leave office).     ?   Behavior: Behavior normal.     ?   Cognition and Memory: Memory is impaired.  ? ? ?Assessment/Plan ? ?1. Essential hypertension ?Home pressures are quite low (see hpi). I feel risk of low pressure is greater than higher pressure due to potential falls, esp with vertigo. Decreasing atenolol to '25mg'$  daily. Daughter will continue to check pressures at home and  she has visit in1 mo with me.  ?- atenolol (TENORMIN) 50 MG tablet; Take 0.5 tablets (25 mg total) by mouth daily.  Dispense: 90 tablet; Refill: 0 ? ?2. Moderate late onset Alzheimer's dementia, unspecified whether behavioral, psychotic, or mood disturbance or anxiety ([place]) ?Following with neuro. On '10mg'$  donepezil. No significant improvement. ? ?3. Orthostatic hypotension ?See above. Taking time with position changes. I will decrease anti-hypertensives as possible.  ?- Ambulatory referral to Home Health ? ?4. Vertigo ?Ok to continue with meclizine as directed. Therapy at home. Let us know if worsening. ?- Ambulatory referral to Home Health ? ?has appointment in1 mo with me ?Daughter is looking into living facilities; will let me know if paperwork required. Due to 24 hour care, daughter is having difficult time fully managing all behaviors and declining condition of Ms. Lyster. ? ?50 minutes spent in chart review, time with patient, charting. ? ? ?Micheline Rough, MD ?

## 2021-05-14 ENCOUNTER — Ambulatory Visit (INDEPENDENT_AMBULATORY_CARE_PROVIDER_SITE_OTHER): Payer: HMO

## 2021-05-14 DIAGNOSIS — I10 Essential (primary) hypertension: Secondary | ICD-10-CM

## 2021-05-14 DIAGNOSIS — F03C11 Unspecified dementia, severe, with agitation: Secondary | ICD-10-CM

## 2021-05-14 DIAGNOSIS — E782 Mixed hyperlipidemia: Secondary | ICD-10-CM

## 2021-05-14 DIAGNOSIS — R42 Dizziness and giddiness: Secondary | ICD-10-CM

## 2021-05-14 NOTE — Patient Instructions (Signed)
Visit Information ? ?Thank you for taking time to visit with me today. Please don't hesitate to contact me if I can be of assistance to you before our next scheduled telephone appointment. ? ?Following are the goals we discussed today:  ?Take all medications as prescribed ?Attend all scheduled provider appointments ?Call provider office for new concerns or questions  ?check blood pressure 3 times per week ?choose a place to take my blood pressure (home, clinic or office, retail store) ?take blood pressure log to all doctor appointments ?begin an exercise program ?eat more whole grains, fruits and vegetables, lean meats and healthy fats ?limit salt intake to '2300mg'$ /day ?call for medicine refill 2 or 3 days before it runs out ?take all medications exactly as prescribed ?call doctor with any symptoms you believe are related to your medicine ? ?Our next appointment is by telephone on 06/27/21 at 11:30 AM ? ?Please call the care guide team at [phone] if you need to cancel or reschedule your appointment.  ? ?If you are experiencing a Mental Health or [place] or need someone to talk to, please call the Suicide and Crisis Lifeline: 988 ?call the [place] National Suicide Prevention Lifeline: 289-317-4397 or TTY: 2204006398 TTY 912-772-2194) to talk to a trained counselor ?call 1-800-273-TALK (toll free, 24 hour hotline) ?call 911  ? ?The patient verbalized understanding of instructions, educational materials, and care plan provided today and agreed to receive a mailed copy of patient instructions, educational materials, and care plan.  ? ?Peter Garter RN, BSN,CCM, CDE ?Care Management Coordinator ?[facility] ?[phone]   ?

## 2021-05-14 NOTE — ED Provider Notes (Signed)
?[facility] ?Provider Note ? ? ?Late Chart Entry ? ? ?CSN: [id number] ?Arrival date & time: 05/07/21  0747 ? ?  ? ?History ? ?Chief Complaint  ?Patient presents with  ? Dizziness  ? ? ?Rhonda Weaver is a 88 y.o. female. ? ?HPI ?She presents with concern of dizziness, difficulty ambulating.  This happened twice, both times after waking up, currently she has no complaints.  She is here with a family member who assists with the history.  No recent change in medication, diet, activity. ?  ? ?Home Medications ?Prior to Admission medications   ?Medication Sig Start Date End Date Taking? Authorizing Provider  ?acetaminophen (TYLENOL) 500 MG tablet Take 500 mg by mouth every 6 (six) hours as needed for mild pain or headache.   Yes [provider]  ?amLODipine (NORVASC) 5 MG tablet Take 1 tablet by mouth once daily ?Patient taking differently: Take 5 mg by mouth daily. 03/31/21  Yes Koberlein, Steele Berg, MD  ?atorvastatin (LIPITOR) 20 MG tablet Take 1 tablet (20 mg total) by mouth daily. 03/31/21  Yes Koberlein, Junell C, MD  ?donepezil (ARICEPT) 5 MG tablet Take 1 tablet (5 mg total) by mouth at bedtime. ?Patient taking differently: Take 10 mg by mouth daily. 01/23/21  Yes Koberlein, Steele Berg, MD  ?Ensure (ENSURE) Take 237 mLs by mouth 2 (two) times daily between meals.   Yes [provider]  ?fluticasone (FLONASE) 50 MCG/ACT nasal spray USE TWO SPRAY(S) IN EACH NOSTRIL ONCE DAILY ?Patient taking differently: Place 2 sprays into both nostrils daily as needed for allergies. 07/21/17  Yes Lucretia Kern, DO  ?fluticasone (FLOVENT HFA) 44 MCG/ACT inhaler Inhale 2 puffs into the lungs as needed. ?Patient taking differently: Inhale 2 puffs into the lungs daily as needed (shortness of breath). 01/12/19  Yes Caren Macadam, MD  ?losartan-hydrochlorothiazide (HYZAAR) 100-12.5 MG tablet Take 1 tablet by mouth once daily 03/31/21  Yes Koberlein, Steele Berg, MD  ?meclizine  (ANTIVERT) 12.5 MG tablet Take 1 tablet (12.5 mg total) by mouth 3 (three) times daily as needed for dizziness. 05/07/21  Yes Tegeler, Gwenyth Allegra, MD  ?omeprazole (PRILOSEC) 20 MG capsule Take 1 capsule (20 mg total) by mouth daily. 03/28/21  Yes Koberlein, Steele Berg, MD  ?potassium chloride (KLOR-CON M) 10 MEQ tablet Take 1 tablet (10 mEq total) by mouth daily. 01/28/21  Yes Koberlein, Steele Berg, MD  ?Propylene Glycol (SYSTANE BALANCE) 0.6 % SOLN Place 1 drop into both eyes daily as needed (dry eyes).   Yes [provider]  ?psyllium (REGULOID) 0.52 g capsule Take 0.52 g by mouth 3 (three) times a week.   Yes [provider]  ?atenolol (TENORMIN) 50 MG tablet Take 0.5 tablets (25 mg total) by mouth daily. 05/13/21   Caren Macadam, MD  ?meclizine (ANTIVERT) 25 MG tablet Take 1 tablet (25 mg total) by mouth 3 (three) times daily as needed for dizziness. 01/12/21   Charlesetta Shanks, MD  ?   ? ?Allergies    ?Fenofibrate, Gemfibrozil, Niacin, Penicillins, Sulfamethoxazole-trimethoprim, and Sulfonamide derivatives   ? ?Review of Systems   ?Review of Systems  ?Constitutional:   ?     Per HPI, otherwise negative  ?HENT:    ?     Per HPI, otherwise negative  ?Respiratory:    ?     Per HPI, otherwise negative  ?Cardiovascular:   ?     Per HPI, otherwise negative  ?Gastrointestinal:  Negative for vomiting.  ?  Endocrine:  ?     Negative aside from HPI  ?Genitourinary:   ?     Neg aside from HPI   ?Musculoskeletal:   ?     Per HPI, otherwise negative  ?Skin: Negative.   ?Neurological:  Positive for dizziness and light-headedness. Negative for syncope.  ? ?Physical Exam ?Updated Vital Signs ?BP (!) 151/124   Pulse 84   Temp 97.6 ?F (36.4 ?C) (Oral)   Resp 16   SpO2 98%  ?Physical Exam ?Vitals and nursing note reviewed.  ?Constitutional:   ?   General: She is not in acute distress. ?   Appearance: She is well-developed.  ?HENT:  ?   Head: Normocephalic and atraumatic.  ?Eyes:  ?   Conjunctiva/sclera:  Conjunctivae normal.  ?Cardiovascular:  ?   Rate and Rhythm: Normal rate and regular rhythm.  ?Pulmonary:  ?   Effort: Pulmonary effort is normal. No respiratory distress.  ?   Breath sounds: Normal breath sounds. No stridor.  ?Abdominal:  ?   General: There is no distension.  ?Skin: ?   General: Skin is warm and dry.  ?Neurological:  ?   Mental Status: She is alert and oriented to person, place, and time.  ?   Cranial Nerves: No cranial nerve deficit.  ?   Motor: Atrophy present. No tremor or abnormal muscle tone.  ?   Comments: At rest no findings, unsteady ambulation, both before and after fluids  ?Psychiatric:     ?   Mood and Affect: Mood normal.  ? ? ?ED Results / Procedures / Treatments   ?Labs ?(all labs ordered are listed, but only abnormal results are displayed) ?Labs Reviewed  ?BASIC METABOLIC PANEL - Abnormal; Notable for the following components:  ?    Result Value  ? Chloride 97 (*)   ? Glucose, Bld 128 (*)   ? All other components within normal limits  ?CBG MONITORING, ED - Abnormal; Notable for the following components:  ? Glucose-Capillary 118 (*)   ? All other components within normal limits  ?CBC  ?URINALYSIS, ROUTINE W REFLEX MICROSCOPIC  ? ? ?EKG ?EKG Interpretation ? ?Date/Time:  Tuesday May 07 2021 11:38:44 EDT ?Ventricular Rate:  66 ?PR Interval:  137 ?QRS Duration: 91 ?QT Interval:  418 ?QTC Calculation: 438 ?R Axis:   37 ?Text Interpretation: Sinus rhythm Atrial premature complex Anterior infarct, old T wave abnormality Abnormal ECG Confirmed by Carmin Muskrat [phone]) on 05/07/2021 11:53:16 AM ? ?Radiology ?No results found. ? ?Procedures ?Procedures  ? ? ?Medications Ordered in ED ?Medications  ?sodium chloride 0.9 % bolus 1,000 mL (0 mLs Intravenous Stopped 05/07/21 1539)  ?meclizine (ANTIVERT) tablet 12.5 mg (12.5 mg Oral Given 05/07/21 1632)  ? ? ?ED Course/ Medical Decision Making/ A&P ?This patient with a Hx of multiple medical issues presents to the ED for concern of gait difficulty,  weakness, this involves an extensive number of treatment options, and is a complaint that carries with it a high risk of complications and morbidity.   ? ?The differential diagnosis includes stroke, dehydration, electrolyte abnormalities, infection ? ? ?Social Determinants of Health: ? ?Age ? ?Additional history obtained: ? ?Additional history and/or information obtained from family member, notable for history of present illness ? ? ?After the initial evaluation, orders, including: Labs, MRI were initiated. ? ? ?Patient placed on Cardiac and Pulse-Oximetry Monitors. ?The patient was maintained on a cardiac monitor.  The cardiac monitored showed an rhythm of 80 sinus normal ?The patient was also  maintained on pulse oximetry. The readings were typically 100% room air normal ? ? ?On repeat evaluation of the patient stayed the same ? ?Lab Tests: ? ?I personally interpreted labs.  The pertinent results include: Reassuring labs ? ?Imaging Studies ordered: ? ? ?MRI pending ? ? ?Dispostion / Final MDM: ? ?After consideration of the diagnostic results and the patient's response to treatment, patient is awaiting MRI, additional fluids.  Though her initial labs are reassuring, and at rest her neuro exam was reassuring aside from atrophy, with persistent dizziness she is awaiting MRI, results, repeat evaluation.  Dr. Gustavus Messing is aware. ? ?Final Clinical Impression(s) / ED Diagnoses ?Final diagnoses:  ?Dizziness  ? ? ?Rx / DC Orders ?ED Discharge Orders   ? ?      Ordered  ?  meclizine (ANTIVERT) 12.5 MG tablet  3 times daily PRN       ? 05/07/21 1759  ? ?  ?  ? ?  ? ? ?  ?Carmin Muskrat, MD ?05/14/21 1713 ? ?

## 2021-05-14 NOTE — Chronic Care Management (AMB) (Signed)
?Chronic Care Management  ? ?CCM RN Visit Note ? ?05/14/2021 ?Name: JANIYAH BEERY MRN: [id number] DOB: May 10, 1930 ? ?Subjective: ?Shaune C Blatchley is a 88 y.o. year old female who is a primary care patient of Koberlein, Steele Berg, MD. The care management team was consulted for assistance with disease management and care coordination needs.   ? ?Engaged with patient by telephone for follow up visit in response to provider referral for case management and/or care coordination services.  ? ?Consent to Services:  ?The patient was given information about Chronic Care Management services, agreed to services, and gave verbal consent prior to initiation of services.  Please see initial visit note for detailed documentation.  ? ?Patient agreed to services and verbal consent obtained.  ? ?Assessment: Review of patient past medical history, allergies, medications, health status, including review of consultants reports, laboratory and other test data, was performed as part of comprehensive evaluation and provision of chronic care management services.  ? ?SDOH (Social Determinants of Health) assessments and interventions performed:   ? ?CCM Care Plan ? ?Allergies  ?Allergen Reactions  ? Fenofibrate   ?  REACTION: pt states intolerant  ? Gemfibrozil   ?  REACTION: pt states intolerant  ? Niacin   ?  REACTION: pt states intolerant  ? Penicillins   ?  REACTION: Hx of arm swelling' \\T'$ \ itching after shot  ? Sulfamethoxazole-Trimethoprim   ?  REACTION: "deathly sick"  w/  N\T\V  ? Sulfonamide Derivatives   ?  REACTION: nausea and vomiting  ? ? ?Outpatient Encounter Medications as of 05/14/2021  ?Medication Sig  ? acetaminophen (TYLENOL) 500 MG tablet Take 500 mg by mouth every 6 (six) hours as needed for mild pain or headache.  ? amLODipine (NORVASC) 5 MG tablet Take 1 tablet by mouth once daily (Patient taking differently: Take 5 mg by mouth daily.)  ? atenolol (TENORMIN) 50 MG tablet Take 0.5 tablets (25 mg total) by mouth daily.  ?  atorvastatin (LIPITOR) 20 MG tablet Take 1 tablet (20 mg total) by mouth daily.  ? donepezil (ARICEPT) 5 MG tablet Take 1 tablet (5 mg total) by mouth at bedtime. (Patient taking differently: Take 10 mg by mouth daily.)  ? Ensure (ENSURE) Take 237 mLs by mouth 2 (two) times daily between meals.  ? fluticasone (FLONASE) 50 MCG/ACT nasal spray USE TWO SPRAY(S) IN EACH NOSTRIL ONCE DAILY (Patient taking differently: Place 2 sprays into both nostrils daily as needed for allergies.)  ? fluticasone (FLOVENT HFA) 44 MCG/ACT inhaler Inhale 2 puffs into the lungs as needed. (Patient taking differently: Inhale 2 puffs into the lungs daily as needed (shortness of breath).)  ? losartan-hydrochlorothiazide (HYZAAR) 100-12.5 MG tablet Take 1 tablet by mouth once daily  ? meclizine (ANTIVERT) 12.5 MG tablet Take 1 tablet (12.5 mg total) by mouth 3 (three) times daily as needed for dizziness.  ? meclizine (ANTIVERT) 25 MG tablet Take 1 tablet (25 mg total) by mouth 3 (three) times daily as needed for dizziness.  ? omeprazole (PRILOSEC) 20 MG capsule Take 1 capsule (20 mg total) by mouth daily.  ? potassium chloride (KLOR-CON M) 10 MEQ tablet Take 1 tablet (10 mEq total) by mouth daily.  ? Propylene Glycol (SYSTANE BALANCE) 0.6 % SOLN Place 1 drop into both eyes daily as needed (dry eyes).  ? psyllium (REGULOID) 0.52 g capsule Take 0.52 g by mouth 3 (three) times a week.  ? ?No facility-administered encounter medications on file as of 05/14/2021.  ? ? ?  Patient Active Problem List  ? Diagnosis Date Noted  ? Dementia ([place]) 05/13/2021  ? Allergic rhinitis 09/28/2007  ? Osteoarthritis 09/28/2007  ? HYPERLIPIDEMIA, MIXED 10/15/2006  ? Essential hypertension 10/15/2006  ? GERD 10/15/2006  ? ? ?Conditions to be addressed/monitored:HTN, HLD, and Dementia ? ?Care Plan : RN Care Manager Plan of Care  ?Updates made by Dimitri Ped, RN since 05/14/2021 12:00 AM  ?  ? ?Problem: Chronic Disease Management and Care Coordination Needs  (Dementia,HTN, osteoarthritis and HLD)   ?Priority: High  ?  ? ?Long-Range Goal: Establish Plan of Care for Chronic Disease Management Needs (Dementia,HTN, osteoarthritis and HLD)   ?Start Date: 01/25/2021  ?Expected End Date: 11/10/2021  ?Priority: High  ?Note:   ?Current Barriers:  ?Knowledge Deficits related to plan of care for management of CAD, HLD, Dementia, and Osteoarthritis  ?Care Coordination needs related to Level of care concerns, ADL IADL limitations, Memory Deficits, Inability to perform ADL's independently, Inability to perform IADL's independently, and Lacks knowledge of community resource: personal care services ?Chronic Disease Management support and education needs related to HTN, HLD, Dementia, and Osteoarthritis  ?Spoke with daughter [name] pt gives consent to speak with her. States she has had dizziness that she went to the ED and saw Dr. Ethlyn Gallery yesterday.  States the meclizine has helped with the dizziness  States she is giving pt her medications and making sure she is eating and drinking enough fluids. States she forgets she has eaten.  States she does give her Ensure sometimes. States she has  been taking the Citrucel pill and her bowels have been moving most days.  States that pt still confused in the afternoon and night but seems to be a little less restless at night.   States she is not sure pt is bathing but she dresses herself.  States pt does not know her later in the day and she is restless and roams around the house.  States she has alarms on the doors so she knows if she tries to go outside and she will lock the door to keep her from going outside without her.  States she has not checked her  B/P today since her atenolol was decreased.  ?RNCM Clinical Goal(s):  ?Patient will verbalize understanding of plan for management of HTN, HLD, Dementia, and Osteoarthritis as evidenced by voiced adherence to plan of care ?verbalize basic understanding of  HTN, HLD, Dementia, and  Osteoarthritis disease process and self health management plan as evidenced by voiced understanding and teach back ?take all medications exactly as prescribed and will call provider for medication related questions as evidenced by dispense report and pt verbalization ?attend all scheduled medical appointments: Neurology 05/23/21 06/20/21, CCM LCSW 421/23, Dr. Ethlyn Gallery 06/14/21 ,CCM PharmD 07/17/21 as evidenced by medical records ?demonstrate Improved adherence to prescribed treatment plan for HTN, HLD, Dementia, and Osteoarthritis as evidenced by readings within limits, voiced adherence to plan of care ?continue to work with RN Care Manager to address care management and care coordination needs related to  HTN, HLD, Dementia, and Osteoarthritis as evidenced by adherence to CM Team Scheduled appointments ?work with Education officer, museum to address  related to the management of Level of care concerns, ADL IADL limitations, Memory Deficits, Inability to perform ADL's independently, Inability to perform IADL's independently, and Lacks knowledge of community resource: caregiver stress related to the management of HTN, HLD, Dementia, and Osteoarthritis as evidenced by review of EMR and patient or social worker report ?work with [employer]  care guide to address needs related to  ADL IADL limitations, Memory Deficits, Inability to perform ADL's independently, Inability to perform IADL's independently, and Lacks knowledge of community resource: personal care services as evidenced by patient and/or community resource care guide support through collaboration with Consulting civil engineer, provider, and care team.  ? ?Interventions: ?1:1 collaboration with primary care provider regarding development and update of comprehensive plan of care as evidenced by provider attestation and co-signature ?Inter-disciplinary care team collaboration (see longitudinal plan of care) ?Evaluation of current treatment plan related to  self management and  patient's adherence to plan as established by provider ? ? ?Hyperlipidemia Interventions:  (Status:  Condition stable.  Not addressed this visit.) Long Term Goal ?Medication review performed; medication list updated in elect

## 2021-05-16 ENCOUNTER — Telehealth: Payer: Self-pay | Admitting: Family Medicine

## 2021-05-16 NOTE — Telephone Encounter (Signed)
Pt daughter is calling with BP readings. Pt was seen on 05-13-2021 on 4-5 BP 121/60 and pulse 75 and 2 hrs later bp 124/60 pulse 69 on 05-16-2021 BP 110/44 and pulse 66 and at 12 noon 121/61 pulse 75. Pt is taking  1/2 atenolol. Please advise ?

## 2021-05-16 NOTE — Telephone Encounter (Signed)
Spoke with the patient's daughter, she stated she was only reporting the numbers as PCP requested and she was informed her of the message below.   ?

## 2021-05-17 ENCOUNTER — Other Ambulatory Visit: Payer: Self-pay | Admitting: Family Medicine

## 2021-05-17 DIAGNOSIS — K30 Functional dyspepsia: Secondary | ICD-10-CM

## 2021-05-21 ENCOUNTER — Telehealth: Payer: Self-pay | Admitting: Family Medicine

## 2021-05-21 DIAGNOSIS — F02B0 Dementia in other diseases classified elsewhere, moderate, without behavioral disturbance, psychotic disturbance, mood disturbance, and anxiety: Secondary | ICD-10-CM | POA: Diagnosis not present

## 2021-05-21 DIAGNOSIS — H8112 Benign paroxysmal vertigo, left ear: Secondary | ICD-10-CM | POA: Diagnosis not present

## 2021-05-21 DIAGNOSIS — I10 Essential (primary) hypertension: Secondary | ICD-10-CM | POA: Diagnosis not present

## 2021-05-21 DIAGNOSIS — I951 Orthostatic hypotension: Secondary | ICD-10-CM | POA: Diagnosis not present

## 2021-05-21 DIAGNOSIS — Z9181 History of falling: Secondary | ICD-10-CM | POA: Diagnosis not present

## 2021-05-21 DIAGNOSIS — S0180XD Unspecified open wound of other part of head, subsequent encounter: Secondary | ICD-10-CM | POA: Diagnosis not present

## 2021-05-21 DIAGNOSIS — G309 Alzheimer's disease, unspecified: Secondary | ICD-10-CM | POA: Diagnosis not present

## 2021-05-23 ENCOUNTER — Encounter: Payer: Self-pay | Admitting: Physician Assistant

## 2021-05-23 ENCOUNTER — Ambulatory Visit: Payer: HMO | Admitting: Physician Assistant

## 2021-05-23 DIAGNOSIS — F0150 Vascular dementia without behavioral disturbance: Secondary | ICD-10-CM | POA: Diagnosis not present

## 2021-05-23 MED ORDER — ESCITALOPRAM OXALATE 5 MG PO TABS: 5.0000 mg | ORAL_TABLET | Freq: Every day | ORAL | 11 refills | Status: AC

## 2021-05-23 MED ORDER — DONEPEZIL HCL 10 MG PO TABS: 10.0000 mg | ORAL_TABLET | Freq: Every day | ORAL | 11 refills | Status: AC

## 2021-05-23 NOTE — Patient Instructions (Addendum)
It was a pleasure to see you today at our office.  ? ?Recommendations: ? ?Meds: ?Follow up in 6 months ?Continue Donepezil to 10 mg daily  ?Start Lexapro 5 mg daily for anxiety  ?Feel free to visit Facebook page " [employer]" for tips of how to care for people with memory problems. ?Consider caregiver support group and home health nursing  ? ? ?RECOMMENDATIONS FOR ALL PATIENTS WITH MEMORY PROBLEMS: ?1. Continue to exercise (Recommend 30 minutes of walking everyday, or 3 hours every week) ?2. Increase social interactions - continue going to [place] and enjoy social gatherings with friends and family ?3. Eat healthy, avoid fried foods and eat more fruits and vegetables ?4. Maintain adequate blood pressure, blood sugar, and blood cholesterol level. Reducing the risk of stroke and cardiovascular disease also helps promoting better memory. ?5. Avoid stressful situations. Live a simple life and avoid aggravations. Organize your time and prepare for the next day in anticipation. ?6. Sleep well, avoid any interruptions of sleep and avoid any distractions in the bedroom that may interfere with adequate sleep quality ?7. Avoid sugar, avoid sweets as there is a strong link between excessive sugar intake, diabetes, and cognitive impairment ?We discussed the Mediterranean diet, which has been shown to help patients reduce the risk of progressive memory disorders and reduces cardiovascular risk. This includes eating fish, eat fruits and green leafy vegetables, nuts like almonds and hazelnuts, walnuts, and also use olive oil. Avoid fast foods and fried foods as much as possible. Avoid sweets and sugar as sugar use has been linked to worsening of memory function. ? ?There is always a concern of gradual progression of memory problems. If this is the case, then we may need to adjust level of care according to patient needs. Support, both to the patient and caregiver, should then be put into place.  ? ? ?The Alzheimer?s Association is here  all day, every day for people facing Alzheimer?s disease through our free 24/7 Helpline: [phone]. The Helpline provides reliable information and support to all those who need assistance, such as individuals living with memory loss, Alzheimer's or other dementia, caregivers, health care professionals and the public.  ?Our highly trained and knowledgeable staff can help you with: ?Understanding memory loss, dementia and Alzheimer's  ?Medications and other treatment options  ?General information about aging and brain health  ?Skills to provide quality care and to find the best care from professionals  ?Legal, financial and living-arrangement decisions ?Our Helpline also features: ?Confidential care consultation provided by master's level clinicians who can help with decision-making support, crisis assistance and education on issues families face every day  ?Help in a caller's preferred language using our translation service that features more than 200 languages and dialects  ?Referrals to local community programs, services and ongoing support ? ? ? ? ?FALL PRECAUTIONS: Be cautious when walking. Scan the area for obstacles that may increase the risk of trips and falls. When getting up in the mornings, sit up at the edge of the bed for a few minutes before getting out of bed. Consider elevating the bed at the head end to avoid drop of blood pressure when getting up. Walk always in a well-lit room (use night lights in the walls). Avoid area rugs or power cords from appliances in the middle of the walkways. Use a walker or a cane if necessary and consider physical therapy for balance exercise. Get your eyesight checked regularly. ? ?FINANCIAL OVERSIGHT: Supervision, especially oversight when making financial decisions or  transactions is also recommended. ? ?HOME SAFETY: Consider the safety of the kitchen when operating appliances like stoves, microwave oven, and blender. Consider having supervision and share cooking  responsibilities until no longer able to participate in those. Accidents with firearms and other hazards in the house should be identified and addressed as well. ? ? ?ABILITY TO BE LEFT ALONE: If patient is unable to contact 911 operator, consider using LifeLine, or when the need is there, arrange for someone to stay with patients. Smoking is a fire hazard, consider supervision or cessation. Risk of wandering should be assessed by caregiver and if detected at any point, supervision and safe proof recommendations should be instituted. ? ?MEDICATION SUPERVISION: Inability to self-administer medication needs to be constantly addressed. Implement a mechanism to ensure safe administration of the medications. ? ? ? ?  ? ? ?Mediterranean Diet ?A Mediterranean diet refers to food and lifestyle choices that are based on the traditions of countries located on the [employer]. This way of eating has been shown to help prevent certain conditions and improve outcomes for people who have chronic diseases, like kidney disease and heart disease. ?What are tips for following this plan? ?Lifestyle  ?Cook and eat meals together with your family, when possible. ?Drink enough fluid to keep your urine clear or pale yellow. ?Be physically active every day. This includes: ?Aerobic exercise like running or swimming. ?Leisure activities like gardening, walking, or housework. ?Get 7-8 hours of sleep each night. ?If recommended by your health care provider, drink red wine in moderation. This means 1 glass a day for nonpregnant women and 2 glasses a day for men. A glass of wine equals 5 oz (150 mL). ?Reading food labels  ?Check the serving size of packaged foods. For foods such as rice and pasta, the serving size refers to the amount of cooked product, not dry. ?Check the total fat in packaged foods. Avoid foods that have saturated fat or trans fats. ?Check the ingredients list for added sugars, such as corn syrup. ?Shopping  ?At the  grocery store, buy most of your food from the areas near the walls of the store. This includes: ?Fresh fruits and vegetables (produce). ?Grains, beans, nuts, and seeds. Some of these may be available in unpackaged forms or large amounts (in bulk). ?Fresh seafood. ?Poultry and eggs. ?Low-fat dairy products. ?Buy whole ingredients instead of prepackaged foods. ?Buy fresh fruits and vegetables in-season from local farmers markets. ?Buy frozen fruits and vegetables in resealable bags. ?If you do not have access to quality fresh seafood, buy precooked frozen shrimp or canned fish, such as tuna, salmon, or sardines. ?Buy small amounts of raw or cooked vegetables, salads, or olives from the deli or salad bar at your store. ?Stock your pantry so you always have certain foods on hand, such as olive oil, canned tuna, canned tomatoes, rice, pasta, and beans. ?Cooking  ?Cook foods with extra-virgin olive oil instead of using butter or other vegetable oils. ?Have meat as a side dish, and have vegetables or grains as your main dish. This means having meat in small portions or adding small amounts of meat to foods like pasta or stew. ?Use beans or vegetables instead of meat in common dishes like chili or lasagna. ?Experiment with different cooking methods. Try roasting or broiling vegetables instead of steaming or saut?eing them. ?Add frozen vegetables to soups, stews, pasta, or rice. ?Add nuts or seeds for added healthy fat at each meal. You can add these to yogurt, salads,  or vegetable dishes. ?Marinate fish or vegetables using olive oil, lemon juice, garlic, and fresh herbs. ?Meal planning  ?Plan to eat 1 vegetarian meal one day each week. Try to work up to 2 vegetarian meals, if possible. ?Eat seafood 2 or more times a week. ?Have healthy snacks readily available, such as: ?Vegetable sticks with hummus. ?[place] yogurt. ?Fruit and nut trail mix. ?Eat balanced meals throughout the week. This includes: ?Fruit: 2-3 servings a  day ?Vegetables: 4-5 servings a day ?Low-fat dairy: 2 servings a day ?Fish, poultry, or lean meat: 1 serving a day ?Beans and legumes: 2 or more servings a week ?Nuts and seeds: 1-2 servings a day ?Whole grains

## 2021-05-23 NOTE — Progress Notes (Signed)
? ? ?Assessment/Plan:  ? ?Rhonda Weaver is a very pleasant 88 y.o. year old RH female with  a history of hypertension, hyperlipidemia, anxiety, history of thyroid nodules and lung nodules (being worked up by PCP), depression seen today for evaluation of memory loss.  She is on donepezil 5 mg daily  MoCA on 03/17/21 was 10/30 . MRI of the brain remarkable for chronic small vessel ischemia and generalized volume loss without clear lobar predilection. Stable form cognitive standpoint, may have some increased irritability since her last visit.  ? ? ? Recommendations:  ? 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 ?Discussed safety both in and out of the home. Continue 24/7 care ?Discussed the importance of regular daily schedule to maintain brain function.  ?Continue to monitor mood with PCP.  ?Stay active at least 30 minutes at least 3 times a week.  ?Naps should be scheduled and should be no longer than 60 minutes and should not occur after 2 PM.  ?Increase donepezil to 10 mg p.o. nightly ?Start Lexapro 5 mg daily for mood, may increase to 10 mg daily if needed ?Recommend strong control cardiovascular risk factors  ?Mediterranean diet is recommended  ?Folllow up in 3 months ? ?Subjective:  ? ?This is a delightful 88 year old RH woman seen today on  follow-up for dementia, likely mixed vascular and Alzheimer's disease.  She was last seen on 03/18/2021.  Since her last visit, the patient was seen at the emergency department with dizziness, trouble ambulating, and what it sounds like vertigo, required in Epley maneuver and vestibular therapy with good control.  She is on meclizine as needed.  Also recently, she had a fall while she was following her daughter, tripping and hitting the left parietal area without loss of consciousness.  In today's visit, she appears to be doing well, and has a very pleasant demeanor.  Her daughter reports that her short-term memory is worse.  She may be repeating herself at  times.  At night, she "talks a lot ".  She spends most of the day in her home, and he enjoys rearranging the kitchen.  She continues to clean and make her own beds.  There is no hoarding behaviors reported.  She is independent of changing her clothes, but does not want to do it, and she has lost the desire to shower, she has to be reminded off.  Her appetite is good, she enjoys eating "a lot of bread ".  She has some behavioral changes, wanting to leave the shades open, she feels very cold at times, and she has some anger spells.  She has a history of depression since her best friend died, sometimes hallucinating that he still there.  Denies any paranoia.  She sleeps well, but has vivid dreams, denies sleepwalking or other REM behavior.  Her daughter is in charge of the finances. She denies any headaches, double vision, dizziness, focal numbness or tingling, unilateral weakness, tremors or anosmia. No history of seizures. Denies urine incontinence, retention, constipation or diarrhea.  ? ? Initial visit 03/18/21 The patient is seen in neurologic consultation at the request of Koberlein, Steele Berg, MD for the evaluation of memory.  The patient is accompanied by her daughter who supplements the history. ?This is an 88 y.o. year old RH  female who has had memory issues for about several years but after her recent fall in December 2022 "things changed".  In review, she sustained a mechanical fall requiring evaluation at the ED.  Since then, she was brought to her daughter's home, who noted significant cognitive declining her mother.  Until that point, they had seen each other often, but living with her brought "everything out ".  It is unclear how many years she could have had the symptoms.  She then began to notice that her mother was repeating herself, was disoriented when walking into her room, and when confronted, she was in denial of any cognitive deficiencies.  She also noticed that about her medications, her only  "go-to" meds were Dramamine and Pepto-Bismol.  After December, she was placed on donepezil 5 mg daily.  She denies leaving objects in unusual places, but cannot find things that she left behind.  She denies any head injuries, when she fell she reduced her back.  She no longer drives.  Her mood is worse in the evening, with intermittent periods of agitation.  She has some depression since her friend [employer], and at times, she hallucinates and sees him.  She denies any paranoia.  She sleeps well, but she has vivid dreams, denies sleepwalking or REM behavior.  There are significant hygiene concerns, she refuses to take a shower, she does not like to change her clothes.  Her daughter is in charge of the finances.  Her appetite is good, denies trouble swallowing.  She does not cook.  She denies any headaches, double vision, dizziness, focal numbness or tingling, unilateral weakness, tremors or anosmia. No history of seizures. Denies urine incontinence, retention, constipation or diarrhea.  Denies OSA, ETOH or Tobacco. Family History remarkable for father with Alzheimer's disease.  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  ?MRI of the brain 01/12/2021 without contrast was negative for acute intracranial abnormalities, but with findings of chronic small vessel ischemia and generalized volume loss without clear lobar predilection. ? ? ?Allergies  ?Allergen Reactions  ? Fenofibrate   ?  REACTION: pt states intolerant  ? Gemfibrozil   ?  REACTION: pt states intolerant  ? Niacin   ?  REACTION: pt states intolerant  ? Penicillins   ?  REACTION: Hx of arm swelling' \\T'$ \ itching after shot  ? Sulfamethoxazole-Trimethoprim   ?  REACTION: "deathly sick"  w/  N\T\V  ? Sulfonamide Derivatives   ?  REACTION: nausea and vomiting  ? ? ?Current Outpatient Medications  ?Medication Instructions  ? acetaminophen (TYLENOL) 500 mg, Oral, Every 6 hours PRN  ?  amLODipine (NORVASC) 5 MG tablet Take 1 tablet by mouth once daily  ? atenolol (TENORMIN) 25 mg, Oral, Daily  ? atorvastatin (LIPITOR) 20 mg, Oral, Daily  ? donepezil (ARICEPT) 10 mg, Oral, Daily  ? Ensure (ENSURE) 237 mLs, Oral, 2 times daily between meals  ? escitalopram (LEXAPRO) 5 mg, Oral, Daily  ? famotidine (PEPCID) 20 MG tablet Take 1 tablet by mouth twice daily  ? fluticasone (FLONASE) 50 MCG/ACT nasal spray USE TWO SPRAY(S) IN EACH NOSTRIL ONCE DAILY  ? fluticasone (FLOVENT HFA) 44 MCG/ACT inhaler 2 puffs, Inhalation, As needed  ? losartan-hydrochlorothiazide (HYZAAR) 100-12.5 MG tablet Take 1 tablet by mouth once daily  ? meclizine (ANTIVERT) 25 mg, Oral, 3 times daily PRN  ? meclizine (ANTIVERT) 12.5 mg, Oral, 3 times daily PRN  ? omeprazole (PRILOSEC) 20 mg, Oral, Daily  ? potassium chloride (KLOR-CON M) 10 MEQ tablet 10 mEq, Oral, Daily  ? Propylene Glycol (SYSTANE BALANCE) 0.6 % SOLN 1 drop,  Both Eyes, Daily PRN  ? psyllium (REGULOID) 0.52 g, Oral, 3 times weekly  ? ? ? ?VITALS:   ?There were no vitals filed for this visit. ? ? ?PHYSICAL EXAM  ? ?HEENT:  Normocephalic, atraumatic. Flat affect. The mucous membranes are moist. The superficial temporal arteries are without ropiness or tenderness. ?Cardiovascular: Regular rate and rhythm. ?Lungs: Clear to auscultation bilaterally. ?Neck: There are no carotid bruits noted bilaterally. ? ?NEUROLOGICAL: ? ?  03/17/2021  ?  7:42 AM  ?Montreal Cognitive Assessment   ?Visuospatial/ Executive (0/5) 1  ?Naming (0/3) 3  ?Attention: Read list of digits (0/2) 1  ?Attention: Read list of letters (0/1) 0  ?Attention: Serial 7 subtraction starting at 100 (0/3) 0  ?Language: Repeat phrase (0/2) 2  ?Language : Fluency (0/1) 0  ?Abstraction (0/2) 0  ?Delayed Recall (0/5) 0  ?Orientation (0/6) 2  ?Total 9  ?Adjusted Score (based on education) 10  ? ? ?  03/19/2021  ?  8:00 AM 04/07/2017  ?  2:32 PM  ?MMSE - Mini Mental State Exam  ?Orientation to time 2 1  ?Orientation to Place  5 5  ?Registration 3 3  ?Attention/ Calculation 1 1  ?Recall 0 0  ?Language- name 2 objects 2 2  ?Language- repeat 0 1  ?Language- follow 3 step command 3 3  ?Language- read & follow direction 1 1  ?Write a s

## 2021-05-24 DIAGNOSIS — G309 Alzheimer's disease, unspecified: Secondary | ICD-10-CM | POA: Diagnosis not present

## 2021-05-24 DIAGNOSIS — Z9181 History of falling: Secondary | ICD-10-CM | POA: Diagnosis not present

## 2021-05-24 DIAGNOSIS — S0180XD Unspecified open wound of other part of head, subsequent encounter: Secondary | ICD-10-CM | POA: Diagnosis not present

## 2021-05-24 DIAGNOSIS — H8112 Benign paroxysmal vertigo, left ear: Secondary | ICD-10-CM | POA: Diagnosis not present

## 2021-05-24 DIAGNOSIS — I951 Orthostatic hypotension: Secondary | ICD-10-CM | POA: Diagnosis not present

## 2021-05-24 DIAGNOSIS — F02B0 Dementia in other diseases classified elsewhere, moderate, without behavioral disturbance, psychotic disturbance, mood disturbance, and anxiety: Secondary | ICD-10-CM | POA: Diagnosis not present

## 2021-05-24 DIAGNOSIS — I10 Essential (primary) hypertension: Secondary | ICD-10-CM | POA: Diagnosis not present

## 2021-05-27 NOTE — Telephone Encounter (Signed)
[place] for home visits. We will continue to monitor potassium levels while on meclizine, but she needs to continue both. Meclizine use hopefully will not be long term and should just be used as needed, but we can continue to monitor need. ?

## 2021-05-28 ENCOUNTER — Telehealth: Payer: Self-pay | Admitting: Physician Assistant

## 2021-05-28 ENCOUNTER — Telehealth: Payer: Self-pay | Admitting: Family Medicine

## 2021-05-28 NOTE — Telephone Encounter (Signed)
Left a detailed message on Rhonda Weaver's voicemail with the approval and information per PCP as below. ?

## 2021-05-28 NOTE — Telephone Encounter (Signed)
Calling regarding a medicine to calm her down. Rhonda Weaver spoke about this at appt.  ?

## 2021-05-28 NOTE — Telephone Encounter (Signed)
Pt daughter call and stated pt need a refill on meclizine (ANTIVERT) 25 MG tablet sent to  ?[id number] [place] Phone:  [phone] ?Fax:  [phone] ?  ?Pt daughter she is out and need it . ?

## 2021-05-29 ENCOUNTER — Other Ambulatory Visit: Payer: Self-pay | Admitting: Family Medicine

## 2021-05-29 MED ORDER — MECLIZINE HCL 25 MG PO TABS: 25.0000 mg | ORAL_TABLET | Freq: Three times a day (TID) | ORAL | 0 refills | Status: DC | PRN

## 2021-05-30 NOTE — Telephone Encounter (Signed)
Patients daughter would like a call to know if her mother is able to take any meds. She said she has not received a call  ?

## 2021-05-31 ENCOUNTER — Telehealth: Payer: PPO

## 2021-06-04 ENCOUNTER — Telehealth: Payer: PPO

## 2021-06-07 ENCOUNTER — Telehealth: Payer: Self-pay | Admitting: Family Medicine

## 2021-06-07 NOTE — Telephone Encounter (Signed)
Hold amlodipine if still running low. Please check daily or BID and report back next week. ?

## 2021-06-07 NOTE — Telephone Encounter (Signed)
Left a detailed message on [name]'s voicemail with the orders as below.  ?

## 2021-06-07 NOTE — Telephone Encounter (Signed)
Reporting lowered blood pressure diastolic in 43Y for the last week, resulting in a fall Tuesday.  ?

## 2021-06-09 DIAGNOSIS — I10 Essential (primary) hypertension: Secondary | ICD-10-CM

## 2021-06-09 DIAGNOSIS — F03C11 Unspecified dementia, severe, with agitation: Secondary | ICD-10-CM

## 2021-06-09 DIAGNOSIS — E782 Mixed hyperlipidemia: Secondary | ICD-10-CM

## 2021-06-11 DIAGNOSIS — H8112 Benign paroxysmal vertigo, left ear: Secondary | ICD-10-CM | POA: Diagnosis not present

## 2021-06-11 DIAGNOSIS — S0180XD Unspecified open wound of other part of head, subsequent encounter: Secondary | ICD-10-CM | POA: Diagnosis not present

## 2021-06-11 DIAGNOSIS — Z9181 History of falling: Secondary | ICD-10-CM | POA: Diagnosis not present

## 2021-06-11 DIAGNOSIS — G309 Alzheimer's disease, unspecified: Secondary | ICD-10-CM | POA: Diagnosis not present

## 2021-06-11 DIAGNOSIS — I10 Essential (primary) hypertension: Secondary | ICD-10-CM | POA: Diagnosis not present

## 2021-06-11 DIAGNOSIS — F02B0 Dementia in other diseases classified elsewhere, moderate, without behavioral disturbance, psychotic disturbance, mood disturbance, and anxiety: Secondary | ICD-10-CM | POA: Diagnosis not present

## 2021-06-11 DIAGNOSIS — I951 Orthostatic hypotension: Secondary | ICD-10-CM | POA: Diagnosis not present

## 2021-06-14 ENCOUNTER — Encounter: Payer: Self-pay | Admitting: Family Medicine

## 2021-06-14 ENCOUNTER — Ambulatory Visit (INDEPENDENT_AMBULATORY_CARE_PROVIDER_SITE_OTHER): Payer: HMO | Admitting: Family Medicine

## 2021-06-14 VITALS — BP 136/60 | HR 67 | Temp 98.3°F | Ht 61.0 in | Wt 113.3 lb

## 2021-06-14 DIAGNOSIS — F02B0 Dementia in other diseases classified elsewhere, moderate, without behavioral disturbance, psychotic disturbance, mood disturbance, and anxiety: Secondary | ICD-10-CM

## 2021-06-14 DIAGNOSIS — I10 Essential (primary) hypertension: Secondary | ICD-10-CM

## 2021-06-14 DIAGNOSIS — G301 Alzheimer's disease with late onset: Secondary | ICD-10-CM

## 2021-06-14 DIAGNOSIS — E782 Mixed hyperlipidemia: Secondary | ICD-10-CM

## 2021-06-14 NOTE — Patient Instructions (Addendum)
Let me know if pressures are still regularly less than 65 diastolic (bottom number) in a week - if they are, then we can have you stop the atenolol. Make sure home pressure kit is reading close to nursing kit.  ?

## 2021-06-14 NOTE — Progress Notes (Signed)
?Rhonda Weaver ?DOB: 1930-11-30 ?Encounter date: 06/14/2021 ? ?This is a 88 y.o. female who presents with ?Chief Complaint  ?Patient presents with  ? Follow-up  ? ? ?History of present illness: ?Last visit with me was 05/13/2021.  We received a phone call that blood pressures were running low at home.  Patient/caregiver advised to hold amlodipine if pressures are remaining low. ? ?We had cut back atenolol to half of dose at last visit.  ? ?Therapist has been coming and checking for dizziness. She is working with her doing walking, regular exercise. There has been another therapist doing exercises for her with vertigo and walks regularly with her.  ? ?She is less agitated since starting the lexapro. Has done well with this. ? ? ?HPI  ? ?Allergies  ?Allergen Reactions  ? Fenofibrate   ?  REACTION: pt states intolerant  ? Gemfibrozil   ?  REACTION: pt states intolerant  ? Niacin   ?  REACTION: pt states intolerant  ? Penicillins   ?  REACTION: Hx of arm swelling' \\T'$ \ itching after shot  ? Sulfamethoxazole-Trimethoprim   ?  REACTION: "deathly sick"  w/  N\T\V  ? Sulfonamide Derivatives   ?  REACTION: nausea and vomiting  ? ?No outpatient medications have been marked as taking for the 06/14/21 encounter (Office Visit) with Caren Macadam, MD.  ? ? ?Review of Systems ? ?Objective: ? ?BP 136/60 Comment: repeat by Mykal--jaf  Pulse 67   Temp 98.3 ?F (36.8 ?C) (Oral)   Ht '5\' 1"'$  (1.549 m)   Wt 113 lb 4.8 oz (51.4 kg)   SpO2 96%   BMI 21.41 kg/m?   Weight: 113 lb 4.8 oz (51.4 kg)  ? ?BP Readings from Last 3 Encounters:  ?06/14/21 136/60  ?05/13/21 140/80  ?05/07/21 (!) 151/124  ? ?Wt Readings from Last 3 Encounters:  ?06/14/21 113 lb 4.8 oz (51.4 kg)  ?05/13/21 112 lb 9.6 oz (51.1 kg)  ?03/18/21 116 lb (52.6 kg)  ? ? ?Physical Exam ?Constitutional:   ?   General: She is not in acute distress. ?   Appearance: She is well-developed.  ?Cardiovascular:  ?   Rate and Rhythm: Normal rate and regular rhythm.  ?   Heart sounds:  Murmur heard.  ?Crescendo systolic murmur is present with a grade of 3/6.  ?  No friction rub.  ?Pulmonary:  ?   Effort: Pulmonary effort is normal. No respiratory distress.  ?   Breath sounds: Normal breath sounds. No wheezing or rales.  ?Musculoskeletal:  ?   Right lower leg: No edema.  ?   Left lower leg: No edema.  ?Neurological:  ?   Mental Status: She is alert and oriented to person, place, and time.  ?Psychiatric:     ?   Behavior: Behavior normal.  ? ? ?Assessment/Plan ?1. Moderate late onset Alzheimer's dementia, unspecified whether behavioral, psychotic, or mood disturbance or anxiety ([place]) ?Patient is calmer, less agitated on current regimen. Daughter is looking into living facilities for patient as she is also aging and it is hard to care for mother in addition to her own/her husbands medical needs.she has not yet selected a living facility but has call with social work next week. I have asked herto keep me posted asshe will need FL2 completed and sent to facility. ? ?3. Essential hypertension ?Blood pressure has been running lower. Amlodipine was stopped less than a week ago, atenolol was decreased at last visit. We are going to have  them monitor at home. If systolic regularly less than 65 will have them stop the atenolol. Will continue to cut back as needed to maintain blood pressure. Discussed risks of hypotension are more of concern for ms. Lahey at this time.  ? ? ? ?Return in about 3 months (around 09/14/2021) for establish care with Dr. Legrand Como. ? ? ? ? ?Micheline Rough, MD ?

## 2021-06-17 NOTE — Progress Notes (Signed)
Scheduled for follow up with Jasmine  ? ?Laverda Sorenson  ?Care Guide, Embedded Care Coordination ?[facility]  ?Direct Dial: [phone] ? ?

## 2021-06-20 ENCOUNTER — Ambulatory Visit: Payer: PPO | Admitting: Physician Assistant

## 2021-06-21 ENCOUNTER — Ambulatory Visit (INDEPENDENT_AMBULATORY_CARE_PROVIDER_SITE_OTHER): Payer: HMO | Admitting: Licensed Clinical Social Worker

## 2021-06-21 DIAGNOSIS — F02B0 Dementia in other diseases classified elsewhere, moderate, without behavioral disturbance, psychotic disturbance, mood disturbance, and anxiety: Secondary | ICD-10-CM

## 2021-06-21 DIAGNOSIS — M1991 Primary osteoarthritis, unspecified site: Secondary | ICD-10-CM

## 2021-06-23 ENCOUNTER — Encounter: Payer: Self-pay | Admitting: Family Medicine

## 2021-06-27 ENCOUNTER — Ambulatory Visit: Payer: HMO

## 2021-06-27 DIAGNOSIS — E782 Mixed hyperlipidemia: Secondary | ICD-10-CM

## 2021-06-27 DIAGNOSIS — F02B0 Dementia in other diseases classified elsewhere, moderate, without behavioral disturbance, psychotic disturbance, mood disturbance, and anxiety: Secondary | ICD-10-CM

## 2021-06-27 DIAGNOSIS — I10 Essential (primary) hypertension: Secondary | ICD-10-CM

## 2021-06-27 NOTE — Patient Instructions (Signed)
Visit Information  Thank you for taking time to visit with me today. Please don't hesitate to contact me if I can be of assistance to you before our next scheduled telephone appointment.  Following are the goals we discussed today:  Take all medications as prescribed Attend all scheduled provider appointments Call provider office for new concerns or questions  check blood pressure 3 times per week choose a place to take my blood pressure (home, clinic or office, retail store) take blood pressure log to all doctor appointments begin an exercise program eat more whole grains, fruits and vegetables, lean meats and healthy fats limit salt intake to '2300mg'$ /day call for medicine refill 2 or 3 days before it runs out take all medications exactly as prescribed call doctor with any symptoms you believe are related to your medicine  Our next appointment is by telephone on 07/30/21 at 11:30 AM  Please call the care guide team at [phone] if you need to cancel or reschedule your appointment.   If you are experiencing a Mental Health or [place] or need someone to talk to, please call the Suicide and Crisis Lifeline: 988 call the [place] National Suicide Prevention Lifeline: (256)027-5520 or TTY: (281)789-2767 TTY (765)717-7009) to talk to a trained counselor call 1-800-273-TALK (toll free, 24 hour hotline) go to [facility] [address], [place] [phone]) call 911   Patient verbalizes understanding of instructions and care plan provided today and agrees to view in [place]. Active MyChart status and patient understanding of how to access instructions and care plan via MyChart confirmed with patient.     Peter Garter RN, Jackquline Denmark, CDE Care Management Coordinator [facility] [phone]

## 2021-06-27 NOTE — Chronic Care Management (AMB) (Signed)
Chronic Care Management    Clinical Social Work Note  06/27/2021 Name: SHIANA RAPPLEYE MRN: [id number] DOB: 24-May-1930  [name] is a 88 y.o. year old female who is a primary care patient of Koberlein, Steele Berg, MD. The CCM team was consulted to assist the patient with chronic disease management and/or care coordination needs related to: Caregiver Stress.   Engaged with patient's daughter by telephone for follow up visit in response to provider referral for social work chronic care management and care coordination services.   Consent to Services:  The patient was given information about Chronic Care Management services, agreed to services, and gave verbal consent prior to initiation of services.  Please see initial visit note for detailed documentation.   Patient agreed to services and consent obtained.   Assessment: Review of patient past medical history, allergies, medications, and health status, including review of relevant consultants reports was performed today as part of a comprehensive evaluation and provision of chronic care management and care coordination services.     SDOH (Social Determinants of Health) assessments and interventions performed:    Advanced Directives Status: Not addressed in this encounter.  CCM Care Plan  Allergies  Allergen Reactions   Fenofibrate     REACTION: pt states intolerant   Gemfibrozil     REACTION: pt states intolerant   Niacin     REACTION: pt states intolerant   Penicillins     REACTION: Hx of arm swelling' \\T'$ \ itching after shot   Sulfamethoxazole-Trimethoprim     REACTION: "deathly sick"  w/  N\T\V   Sulfonamide Derivatives     REACTION: nausea and vomiting    Outpatient Encounter Medications as of 06/21/2021  Medication Sig   acetaminophen (TYLENOL) 500 MG tablet Take 500 mg by mouth every 6 (six) hours as needed for mild pain or headache.   atenolol (TENORMIN) 50 MG tablet Take 0.5 tablets (25 mg total) by mouth daily.    atorvastatin (LIPITOR) 20 MG tablet Take 1 tablet (20 mg total) by mouth daily.   donepezil (ARICEPT) 10 MG tablet Take 1 tablet (10 mg total) by mouth daily.   Ensure (ENSURE) Take 237 mLs by mouth 2 (two) times daily between meals.   escitalopram (LEXAPRO) 5 MG tablet Take 1 tablet (5 mg total) by mouth daily.   famotidine (PEPCID) 20 MG tablet Take 1 tablet by mouth twice daily   fluticasone (FLONASE) 50 MCG/ACT nasal spray USE TWO SPRAY(S) IN EACH NOSTRIL ONCE DAILY (Patient taking differently: Place 2 sprays into both nostrils daily as needed for allergies.)   fluticasone (FLOVENT HFA) 44 MCG/ACT inhaler Inhale 2 puffs into the lungs as needed. (Patient taking differently: Inhale 2 puffs into the lungs daily as needed (shortness of breath).)   losartan-hydrochlorothiazide (HYZAAR) 100-12.5 MG tablet Take 1 tablet by mouth once daily   meclizine (ANTIVERT) 12.5 MG tablet Take 1 tablet (12.5 mg total) by mouth 3 (three) times daily as needed for dizziness.   meclizine (ANTIVERT) 25 MG tablet Take 1 tablet (25 mg total) by mouth 3 (three) times daily as needed for dizziness.   omeprazole (PRILOSEC) 20 MG capsule Take 1 capsule (20 mg total) by mouth daily.   potassium chloride (KLOR-CON M) 10 MEQ tablet Take 1 tablet (10 mEq total) by mouth daily.   Propylene Glycol (SYSTANE BALANCE) 0.6 % SOLN Place 1 drop into both eyes daily as needed (dry eyes).   psyllium (REGULOID) 0.52 g capsule Take 0.52 g by mouth 3 (  three) times a week.   No facility-administered encounter medications on file as of 06/21/2021.    Patient Active Problem List   Diagnosis Date Noted   Dementia ([place]) 05/13/2021   Allergic rhinitis 09/28/2007   Osteoarthritis 09/28/2007   HYPERLIPIDEMIA, MIXED 10/15/2006   Essential hypertension 10/15/2006   GERD 10/15/2006    Conditions to be addressed/monitored: Dementia and Osteoarthritis; Caregiver Stress  Care Plan : LCSW Plan of Care  Updates made by Rebekah Chesterfield, LCSW  since 06/27/2021 12:00 AM     Problem: Quality of Life (General Plan of Care)      Long-Range Goal: Quality of Life Maintained   Start Date: 03/07/2021  Expected End Date: 10/10/2021  This Visit's Progress: On track  Recent Progress: On track  Priority: High  Note:   Current Barriers:   Patient unable to consistently perform activities of daily living and needs assistance in order to meet this unmet needs Clinical Goals: Patient will work with CCM LCSW to address needs related to management of health conditions to promote well-being  Clinical Interventions: Assessment of needs, barriers , agencies contacted, as well as how stressors are negatively impacting functioning. All hx provided by patient's daughter, [name] Pt continues to monitor BP readings twice daily and agreed to keep PCP updated LCSW assisted pt's daughter with establishing MyChart Daughter reports occasions where pt is observed as restless and wanting to return home, to where she was living independently. Pt's memory continues to decline with episodes of irritability Identified triggers to outbursts and strategies to assist with de-escalation discussed  LCSW assisted family in processing mixed emotions about placement  CCM LCSW discussed various options to promote safety and strengthen support system. The process regarding placement and/or obtaining in home aids discussed. LCSW will mail supportive documentation to address on file Active listening / Reflection utilized  Emotional Support Provided Provided psychoeducation for mental health needs  Reviewed mental health medications and discussed importance of compliance Caregiver stress acknowledged  Consideration of in-home help encouraged : options discussed Verbalization of feelings encouraged  Discussed caregiver resources and support:  1:1 collaboration with primary care provider regarding development and update of comprehensive plan of care as evidenced by  provider attestation and co-signature Inter-disciplinary care team collaboration (see longitudinal plan of care) Patient Goals/Self-Care Activities: Over the next 120 days Attend scheduled appointments with providers Utilize healthy coping skills and/or supportive resources provided Contact PCP office with any questions or concerns       Follow Up Plan: Appointment scheduled for SW follow up with client by phone on: 07/12/21  [name], MSW, [facility] [place] [facility] [email or web address] Phone [phone] 5:44 AM

## 2021-06-27 NOTE — Patient Instructions (Signed)
Visit Information  Thank you for taking time to visit with me today. Please don't hesitate to contact me if I can be of assistance to you before our next scheduled telephone appointment.  Following are the goals we discussed today:  Patient Goals/Self-Care Activities: Over the next 120 days Attend scheduled appointments with providers Utilize healthy coping skills and/or supportive resources provided Contact PCP office with any questions or concerns  Our next appointment is by telephone on 07/12/21 at 10:00 AM  Please call the care guide team at [phone] if you need to cancel or reschedule your appointment.   If you are experiencing a Mental Health or [place] or need someone to talk to, please call [phone]   Patient verbalizes understanding of instructions and care plan provided today and agrees to view in [facility]. Active MyChart status and patient understanding of how to access instructions and care plan via MyChart confirmed with patient.     [name], MSW, [facility] [email or web address] Phone [phone] 5:45 AM

## 2021-06-27 NOTE — Chronic Care Management (AMB) (Signed)
Chronic Care Management   CCM RN Visit Note  06/27/2021 Name: Rhonda Weaver MRN: [id number] DOB: November 16, 1930  Subjective: Rhonda Weaver is a 88 y.o. year old female who is a primary care patient of Koberlein, Steele Berg, MD. The care management team was consulted for assistance with disease management and care coordination needs.    Engaged with patient by telephone for follow up visit in response to provider referral for case management and/or care coordination services.   Consent to Services:  The patient was given information about Chronic Care Management services, agreed to services, and gave verbal consent prior to initiation of services.  Please see initial visit note for detailed documentation.   Patient agreed to services and verbal consent obtained.   Assessment: Review of patient past medical history, allergies, medications, health status, including review of consultants reports, laboratory and other test data, was performed as part of comprehensive evaluation and provision of chronic care management services.   SDOH (Social Determinants of Health) assessments and interventions performed:    CCM Care Plan  Allergies  Allergen Reactions   Fenofibrate     REACTION: pt states intolerant   Gemfibrozil     REACTION: pt states intolerant   Niacin     REACTION: pt states intolerant   Penicillins     REACTION: Hx of arm swelling \T\ itching after shot   Sulfamethoxazole-Trimethoprim     REACTION: "deathly sick"  w/  N\T\V   Sulfonamide Derivatives     REACTION: nausea and vomiting    Outpatient Encounter Medications as of 06/27/2021  Medication Sig   acetaminophen (TYLENOL) 500 MG tablet Take 500 mg by mouth every 6 (six) hours as needed for mild pain or headache.   atenolol (TENORMIN) 50 MG tablet Take 0.5 tablets (25 mg total) by mouth daily.   atorvastatin (LIPITOR) 20 MG tablet Take 1 tablet (20 mg total) by mouth daily.   donepezil (ARICEPT) 10 MG tablet Take 1  tablet (10 mg total) by mouth daily.   Ensure (ENSURE) Take 237 mLs by mouth 2 (two) times daily between meals.   escitalopram (LEXAPRO) 5 MG tablet Take 1 tablet (5 mg total) by mouth daily.   famotidine (PEPCID) 20 MG tablet Take 1 tablet by mouth twice daily   fluticasone (FLONASE) 50 MCG/ACT nasal spray USE TWO SPRAY(S) IN EACH NOSTRIL ONCE DAILY (Patient taking differently: Place 2 sprays into both nostrils daily as needed for allergies.)   fluticasone (FLOVENT HFA) 44 MCG/ACT inhaler Inhale 2 puffs into the lungs as needed. (Patient taking differently: Inhale 2 puffs into the lungs daily as needed (shortness of breath).)   losartan-hydrochlorothiazide (HYZAAR) 100-12.5 MG tablet Take 1 tablet by mouth once daily   meclizine (ANTIVERT) 12.5 MG tablet Take 1 tablet (12.5 mg total) by mouth 3 (three) times daily as needed for dizziness.   meclizine (ANTIVERT) 25 MG tablet Take 1 tablet (25 mg total) by mouth 3 (three) times daily as needed for dizziness.   omeprazole (PRILOSEC) 20 MG capsule Take 1 capsule (20 mg total) by mouth daily.   potassium chloride (KLOR-CON M) 10 MEQ tablet Take 1 tablet (10 mEq total) by mouth daily.   Propylene Glycol (SYSTANE BALANCE) 0.6 % SOLN Place 1 drop into both eyes daily as needed (dry eyes).   psyllium (REGULOID) 0.52 g capsule Take 0.52 g by mouth 3 (three) times a week.   No facility-administered encounter medications on file as of 06/27/2021.    Patient Active Problem  List   Diagnosis Date Noted   Dementia ([place]) 05/13/2021   Allergic rhinitis 09/28/2007   Osteoarthritis 09/28/2007   HYPERLIPIDEMIA, MIXED 10/15/2006   Essential hypertension 10/15/2006   GERD 10/15/2006    Conditions to be addressed/monitored:HTN, HLD, and Dementia  Care Plan : RN Care Manager Plan of Care  Updates made by Dimitri Ped, RN since 06/27/2021 12:00 AM     Problem: Chronic Disease Management and Care Coordination Needs (Dementia,HTN, osteoarthritis and HLD)    Priority: High     Long-Range Goal: Establish Plan of Care for Chronic Disease Management Needs (Dementia,HTN, osteoarthritis and HLD)   Start Date: 01/25/2021  Expected End Date: 11/10/2021  Priority: High  Note:   Current Barriers:  Knowledge Deficits related to plan of care for management of CAD, HLD, Dementia, and Osteoarthritis  Care Coordination needs related to Level of care concerns, ADL IADL limitations, Memory Deficits, Inability to perform ADL's independently, Inability to perform IADL's independently, and Lacks knowledge of community resource: personal care services Chronic Disease Management support and education needs related to HTN, HLD, Dementia, and Osteoarthritis  Spoke with daughter Rhonda Weaver pt gives consent to speak with her. States she her dizziness is better after the therapist did the maneuver on her. States the meclizine has helped with the dizziness. States her B/P has been ranging around 119/57 to 136/67. States she is trying to decide if and when to move pt to memory care.  States she is giving pt her medications and making sure she is eating and drinking enough fluids. States she forgets she has eaten.  States she does give her Ensure sometimes.  States that pt still confused in the afternoon and night but seems to be a little less restless at night.   States she is not sure pt is bathing but she dresses herself.  States pt does not know her later in the day and she is restless and roams around the house.  States she has alarms on the doors so she knows if she tries to go outside and she will lock the door to keep her from going outside without her.   RNCM Clinical Goal(s):  Patient will verbalize understanding of plan for management of HTN, HLD, Dementia, and Osteoarthritis as evidenced by voiced adherence to plan of care verbalize basic understanding of  HTN, HLD, Dementia, and Osteoarthritis disease process and self health management plan as evidenced by voiced  understanding and teach back take all medications exactly as prescribed and will call provider for medication related questions as evidenced by dispense report and pt verbalization attend all scheduled medical appointments: Neurology 11/10/21, CCM LCSW 07/12/21 ,CCM PharmD 07/17/21 as evidenced by medical records demonstrate Improved adherence to prescribed treatment plan for HTN, HLD, Dementia, and Osteoarthritis as evidenced by readings within limits, voiced adherence to plan of care continue to work with RN Care Manager to address care management and care coordination needs related to  HTN, HLD, Dementia, and Osteoarthritis as evidenced by adherence to CM Team Scheduled appointments work with Education officer, museum to address  related to the management of Level of care concerns, ADL IADL limitations, Memory Deficits, Inability to perform ADL's independently, Inability to perform IADL's independently, and Lacks knowledge of community resource: caregiver stress related to the management of HTN, HLD, Dementia, and Osteoarthritis as evidenced by review of EMR and patient or social worker report work with [employer] care guide to address needs related to  ADL IADL limitations, Memory Deficits, Inability to perform  ADL's independently, Inability to perform IADL's independently, and Lacks knowledge of community resource: personal care services as evidenced by patient and/or community resource care guide support through collaboration with Consulting civil engineer, provider, and care team.   Interventions: 1:1 collaboration with primary care provider regarding development and update of comprehensive plan of care as evidenced by provider attestation and co-signature Inter-disciplinary care team collaboration (see longitudinal plan of care) Evaluation of current treatment plan related to  self management and patient's adherence to plan as established by provider   Hyperlipidemia Interventions:  (Status:  Condition stable.   Not addressed this visit.) Long Term Goal Medication review performed; medication list updated in electronic medical record.  Provider established cholesterol goals reviewed Counseled on importance of regular laboratory monitoring as prescribed Reviewed role and benefits of statin for ASCVD risk reduction  Hypertension Interventions:  (Status:  Goal on track:  Yes.) Long Term Goal Last practice recorded BP readings:  BP Readings from Last 3 Encounters:  06/14/21 136/60  05/13/21 140/80  05/07/21 (!) 151/124  Most recent eGFR/CrCl: No results found for: EGFR  No components found for: CRCL  Evaluation of current treatment plan related to hypertension self management and patient's adherence to plan as established by provider Provided education to patient re: stroke prevention, s/s of heart attack and stroke Reviewed medications with patient and discussed importance of compliance Discussed plans with patient for ongoing care management follow up and provided patient with direct contact information for care management team Advised patient, providing education and rationale, to monitor blood pressure daily and record, calling PCP for findings outside established parameters Provided education on prescribed diet low sodium Discussed complications of poorly controlled blood pressure such as heart disease, stroke, circulatory complications, vision complications, kidney impairment, sexual dysfunction Reinforced importance of keeping B/P in range to help with overall health and  dementia.  Reinforced goals for B/P and to call provider if pt has lower readings with symptoms   Dementia:  (Status:  Goal on track:  Yes.)  Long Term Goal Evaluation of current treatment plan related to misuse of: with behavioral disturbance Reviewed medications including Aricept , Emotional Support Provided to patient/caregiver, Sleep hygiene recommendations and education provided, Consideration of in-home help encouraged ,  Referred to LCSW for assistance with Guardianship discussion, Discussed importance of attendance to all provider appointments, Advised to contact provider for new or worsening symptoms, and  Allowed caregiver to express her feeling and frustrations with caring for pt.  Encouraged to start looking at facilities.  Reviewed to ways to help her assist pt with bathing and toileting. Reinforced to try to redirect pt when she tries to leave. Reinforced importance of pt drinking adequate amounts of fluids and taking medications as directed, Reviewed with caregiver ways to handle stress and care of pt  Patient Goals/Self-Care Activities: Take all medications as prescribed Attend all scheduled provider appointments Call provider office for new concerns or questions  check blood pressure 3 times per week choose a place to take my blood pressure (home, clinic or office, retail store) take blood pressure log to all doctor appointments begin an exercise program eat more whole grains, fruits and vegetables, lean meats and healthy fats limit salt intake to 2350m/day call for medicine refill 2 or 3 days before it runs out take all medications exactly as prescribed call doctor with any symptoms you believe are related to your medicine  Follow Up Plan:  Telephone follow up appointment with care management team member scheduled for:  07/30/21  The patient has been provided with contact information for the care management team and has been advised to call with any health related questions or concerns.       Plan:Telephone follow up appointment with care management team member scheduled for:  07/30/21 The patient has been provided with contact information for the care management team and has been advised to call with any health related questions or concerns.  Peter Garter RN, Jackquline Denmark, CDE Care Management Coordinator [facility] [phone]

## 2021-07-04 ENCOUNTER — Encounter: Payer: Self-pay | Admitting: Family Medicine

## 2021-07-04 ENCOUNTER — Other Ambulatory Visit: Payer: Self-pay | Admitting: *Deleted

## 2021-07-04 ENCOUNTER — Ambulatory Visit: Payer: HMO | Admitting: Licensed Clinical Social Worker

## 2021-07-04 DIAGNOSIS — M1991 Primary osteoarthritis, unspecified site: Secondary | ICD-10-CM

## 2021-07-04 DIAGNOSIS — I10 Essential (primary) hypertension: Secondary | ICD-10-CM

## 2021-07-04 DIAGNOSIS — G301 Alzheimer's disease with late onset: Secondary | ICD-10-CM

## 2021-07-04 MED ORDER — OMEPRAZOLE 20 MG PO CPDR: 20.0000 mg | DELAYED_RELEASE_CAPSULE | Freq: Every day | ORAL | 0 refills | Status: DC

## 2021-07-04 NOTE — Telephone Encounter (Signed)
Rx done. 

## 2021-07-05 ENCOUNTER — Other Ambulatory Visit: Payer: Self-pay | Admitting: Family Medicine

## 2021-07-05 MED ORDER — LOSARTAN POTASSIUM 100 MG PO TABS: 100.0000 mg | ORAL_TABLET | Freq: Every day | ORAL | 1 refills | Status: DC

## 2021-07-05 NOTE — Chronic Care Management (AMB) (Signed)
Chronic Care Management    Clinical Social Work Note  07/05/2021 Name: PARA COSSEY MRN: [id number] DOB: 09-May-1930  [name] is a 88 y.o. year old female who is a primary care patient of Koberlein, Steele Berg, MD. The CCM team was consulted to assist the patient with chronic disease management and/or care coordination needs related to: Caregiver Stress.   Engaged with patient's daughter by telephone for follow up visit in response to provider referral for social work chronic care management and care coordination services.   Consent to Services:  The patient was given information about Chronic Care Management services, agreed to services, and gave verbal consent prior to initiation of services.  Please see initial visit note for detailed documentation.   Patient agreed to services and consent obtained.   Assessment: Review of patient past medical history, allergies, medications, and health status, including review of relevant consultants reports was performed today as part of a comprehensive evaluation and provision of chronic care management and care coordination services.     SDOH (Social Determinants of Health) assessments and interventions performed:    Advanced Directives Status: Not addressed in this encounter.  CCM Care Plan  Allergies  Allergen Reactions   Fenofibrate     REACTION: pt states intolerant   Gemfibrozil     REACTION: pt states intolerant   Niacin     REACTION: pt states intolerant   Penicillins     REACTION: Hx of arm swelling' \\T'$ \ itching after shot   Sulfamethoxazole-Trimethoprim     REACTION: "deathly sick"  w/  N\T\V   Sulfonamide Derivatives     REACTION: nausea and vomiting    Outpatient Encounter Medications as of 07/04/2021  Medication Sig   acetaminophen (TYLENOL) 500 MG tablet Take 500 mg by mouth every 6 (six) hours as needed for mild pain or headache.   atenolol (TENORMIN) 50 MG tablet Take 0.5 tablets (25 mg total) by mouth daily.    atorvastatin (LIPITOR) 20 MG tablet Take 1 tablet (20 mg total) by mouth daily.   donepezil (ARICEPT) 10 MG tablet Take 1 tablet (10 mg total) by mouth daily.   Ensure (ENSURE) Take 237 mLs by mouth 2 (two) times daily between meals.   escitalopram (LEXAPRO) 5 MG tablet Take 1 tablet (5 mg total) by mouth daily.   famotidine (PEPCID) 20 MG tablet Take 1 tablet by mouth twice daily   fluticasone (FLONASE) 50 MCG/ACT nasal spray USE TWO SPRAY(S) IN EACH NOSTRIL ONCE DAILY (Patient taking differently: Place 2 sprays into both nostrils daily as needed for allergies.)   fluticasone (FLOVENT HFA) 44 MCG/ACT inhaler Inhale 2 puffs into the lungs as needed. (Patient taking differently: Inhale 2 puffs into the lungs daily as needed (shortness of breath).)   losartan-hydrochlorothiazide (HYZAAR) 100-12.5 MG tablet Take 1 tablet by mouth once daily   meclizine (ANTIVERT) 12.5 MG tablet Take 1 tablet (12.5 mg total) by mouth 3 (three) times daily as needed for dizziness.   meclizine (ANTIVERT) 25 MG tablet Take 1 tablet (25 mg total) by mouth 3 (three) times daily as needed for dizziness.   omeprazole (PRILOSEC) 20 MG capsule Take 1 capsule (20 mg total) by mouth daily.   potassium chloride (KLOR-CON M) 10 MEQ tablet Take 1 tablet (10 mEq total) by mouth daily.   Propylene Glycol (SYSTANE BALANCE) 0.6 % SOLN Place 1 drop into both eyes daily as needed (dry eyes).   psyllium (REGULOID) 0.52 g capsule Take 0.52 g by mouth 3 (  three) times a week.   No facility-administered encounter medications on file as of 07/04/2021.    Patient Active Problem List   Diagnosis Date Noted   Dementia ([place]) 05/13/2021   Allergic rhinitis 09/28/2007   Osteoarthritis 09/28/2007   HYPERLIPIDEMIA, MIXED 10/15/2006   Essential hypertension 10/15/2006   GERD 10/15/2006    Conditions to be addressed/monitored: Dementia and Osteoarthritis  Care Plan : LCSW Plan of Care  Updates made by Rebekah Chesterfield, LCSW since 07/05/2021  12:00 AM     Problem: Quality of Life (General Plan of Care)      Long-Range Goal: Quality of Life Maintained   Start Date: 03/07/2021  Expected End Date: 10/10/2021  This Visit's Progress: On track  Recent Progress: On track  Priority: High  Note:   Current Barriers:   Patient unable to consistently perform activities of daily living and needs assistance in order to meet this unmet needs Clinical Goals: Patient will work with CCM LCSW to address needs related to management of health conditions to promote well-being  Clinical Interventions: Assessment of needs, barriers , agencies contacted, as well as how stressors are negatively impacting functioning. All hx provided by patient's daughter, [name] Pt continues to monitor BP readings twice daily and agreed to keep PCP updated 5/25: [name] requested confirmation from LCSW that updated BP readings were uploaded in [place] for PCP's review Pt sustained an injury resulting in a long bruise and some discomfort. Pt is currently administered tylenol to manage symptoms Daughter reports occasions where pt is observed as restless and wanting to return home, to where she was living independently. Pt's memory continues to decline with episodes of irritability Identified triggers to outbursts and strategies to assist with de-escalation discussed  LCSW assisted family in processing mixed emotions about placement  CCM LCSW discussed various options to promote safety and strengthen support system. The process regarding placement and/or obtaining in home aids discussed. LCSW will mail supportive documentation to address on file Active listening / Reflection utilized  Emotional Support Provided Provided psychoeducation for mental health needs  Reviewed mental health medications and discussed importance of compliance Caregiver stress acknowledged  Consideration of in-home help encouraged : options discussed Verbalization of feelings encouraged   Discussed caregiver resources and support:  1:1 collaboration with primary care provider regarding development and update of comprehensive plan of care as evidenced by provider attestation and co-signature Inter-disciplinary care team collaboration (see longitudinal plan of care) Patient Goals/Self-Care Activities: Over the next 120 days Attend scheduled appointments with providers Utilize healthy coping skills and/or supportive resources provided Contact PCP office with any questions or concerns       Follow Up Plan: Appointment scheduled for SW follow up with client by phone on: 07/12/21  [name], MSW, [facility] [email or web address] Phone [phone] 12:48 PM

## 2021-07-05 NOTE — Patient Instructions (Signed)
Visit Information  Thank you for taking time to visit with me today. Please don't hesitate to contact me if I can be of assistance to you before our next scheduled telephone appointment.  Following are the goals we discussed today:  Patient Goals/Self-Care Activities: Over the next 120 days Attend scheduled appointments with providers Utilize healthy coping skills and/or supportive resources provided Contact PCP office with any questions or concerns  Our next appointment is by telephone on 07/12/21 at 10 AM  Please call the care guide team at [phone] if you need to cancel or reschedule your appointment.   If you are experiencing a Mental Health or [place] or need someone to talk to, please call 911   Patient verbalizes understanding of instructions and care plan provided today and agrees to view in [place]. Active MyChart status and patient understanding of how to access instructions and care plan via MyChart confirmed with patient.     [name], MSW, [email or web address] Phone [phone] 12:49 PM

## 2021-07-09 DIAGNOSIS — G309 Alzheimer's disease, unspecified: Secondary | ICD-10-CM | POA: Diagnosis not present

## 2021-07-09 DIAGNOSIS — I951 Orthostatic hypotension: Secondary | ICD-10-CM | POA: Diagnosis not present

## 2021-07-09 DIAGNOSIS — Z9181 History of falling: Secondary | ICD-10-CM | POA: Diagnosis not present

## 2021-07-09 DIAGNOSIS — I10 Essential (primary) hypertension: Secondary | ICD-10-CM | POA: Diagnosis not present

## 2021-07-09 DIAGNOSIS — S0180XD Unspecified open wound of other part of head, subsequent encounter: Secondary | ICD-10-CM | POA: Diagnosis not present

## 2021-07-09 DIAGNOSIS — F02B0 Dementia in other diseases classified elsewhere, moderate, without behavioral disturbance, psychotic disturbance, mood disturbance, and anxiety: Secondary | ICD-10-CM | POA: Diagnosis not present

## 2021-07-09 DIAGNOSIS — H8112 Benign paroxysmal vertigo, left ear: Secondary | ICD-10-CM | POA: Diagnosis not present

## 2021-07-10 DIAGNOSIS — E785 Hyperlipidemia, unspecified: Secondary | ICD-10-CM | POA: Diagnosis not present

## 2021-07-10 DIAGNOSIS — I10 Essential (primary) hypertension: Secondary | ICD-10-CM

## 2021-07-10 DIAGNOSIS — F0390 Unspecified dementia without behavioral disturbance: Secondary | ICD-10-CM

## 2021-07-12 ENCOUNTER — Ambulatory Visit (INDEPENDENT_AMBULATORY_CARE_PROVIDER_SITE_OTHER): Payer: HMO | Admitting: Licensed Clinical Social Worker

## 2021-07-12 DIAGNOSIS — G301 Alzheimer's disease with late onset: Secondary | ICD-10-CM

## 2021-07-12 DIAGNOSIS — M1991 Primary osteoarthritis, unspecified site: Secondary | ICD-10-CM

## 2021-07-12 DIAGNOSIS — I10 Essential (primary) hypertension: Secondary | ICD-10-CM

## 2021-07-12 NOTE — Patient Instructions (Signed)
Visit Information  Thank you for taking time to visit with me today. Please don't hesitate to contact me if I can be of assistance to you before our next scheduled telephone appointment.  Following are the goals we discussed today:  Patient Goals/Self-Care Activities: Over the next 120 days Attend scheduled appointments with providers Utilize healthy coping skills and/or supportive resources provided Contact PCP office with any questions or concerns  Our next appointment is by telephone on 08/23/21 at 10 AM  Please call the care guide team at [phone] if you need to cancel or reschedule your appointment.   If you are experiencing a Mental Health or [place] or need someone to talk to, please call 911   Patient verbalizes understanding of instructions and care plan provided today and agrees to view in [place]. Active MyChart status and patient understanding of how to access instructions and care plan via MyChart confirmed with patient.

## 2021-07-12 NOTE — Chronic Care Management (AMB) (Signed)
Chronic Care Management    Clinical Social Work Note  07/16/2021 Name: Rhonda Weaver MRN: [id number] DOB: August 15, 1930  Rhonda Weaver is a 88 y.o. year old female who is a primary care patient of Koberlein, Steele Berg, MD. The CCM team was consulted to assist the patient with chronic disease management and/or care coordination needs related to: Caregiver Stress.   Engaged with patient's daughter by telephone for follow up visit in response to provider referral for social work chronic care management and care coordination services.   Consent to Services:  The patient was given information about Chronic Care Management services, agreed to services, and gave verbal consent prior to initiation of services.  Please see initial visit note for detailed documentation.   Patient agreed to services and consent obtained.   Assessment: Review of patient past medical history, allergies, medications, and health status, including review of relevant consultants reports was performed today as part of a comprehensive evaluation and provision of chronic care management and care coordination services.     SDOH (Social Determinants of Health) assessments and interventions performed:    Advanced Directives Status: Not addressed in this encounter.  CCM Care Plan  Allergies  Allergen Reactions   Fenofibrate     REACTION: pt states intolerant   Gemfibrozil     REACTION: pt states intolerant   Niacin     REACTION: pt states intolerant   Penicillins     REACTION: Hx of arm swelling' \\T'$ \ itching after shot   Sulfamethoxazole-Trimethoprim     REACTION: "deathly sick"  w/  N\T\V   Sulfonamide Derivatives     REACTION: nausea and vomiting    Outpatient Encounter Medications as of 07/12/2021  Medication Sig   acetaminophen (TYLENOL) 500 MG tablet Take 500 mg by mouth every 6 (six) hours as needed for mild pain or headache.   atorvastatin (LIPITOR) 20 MG tablet Take 1 tablet (20 mg total) by mouth daily.    donepezil (ARICEPT) 10 MG tablet Take 1 tablet (10 mg total) by mouth daily.   Ensure (ENSURE) Take 237 mLs by mouth 2 (two) times daily between meals.   escitalopram (LEXAPRO) 5 MG tablet Take 1 tablet (5 mg total) by mouth daily.   famotidine (PEPCID) 20 MG tablet Take 1 tablet by mouth twice daily   fluticasone (FLONASE) 50 MCG/ACT nasal spray USE TWO SPRAY(S) IN EACH NOSTRIL ONCE DAILY (Patient taking differently: Place 2 sprays into both nostrils daily as needed for allergies.)   fluticasone (FLOVENT HFA) 44 MCG/ACT inhaler Inhale 2 puffs into the lungs as needed. (Patient taking differently: Inhale 2 puffs into the lungs daily as needed (shortness of breath).)   losartan (COZAAR) 100 MG tablet Take 1 tablet (100 mg total) by mouth daily.   meclizine (ANTIVERT) 12.5 MG tablet Take 1 tablet (12.5 mg total) by mouth 3 (three) times daily as needed for dizziness.   meclizine (ANTIVERT) 25 MG tablet Take 1 tablet (25 mg total) by mouth 3 (three) times daily as needed for dizziness.   omeprazole (PRILOSEC) 20 MG capsule Take 1 capsule (20 mg total) by mouth daily.   potassium chloride (KLOR-CON M) 10 MEQ tablet Take 1 tablet (10 mEq total) by mouth daily.   Propylene Glycol (SYSTANE BALANCE) 0.6 % SOLN Place 1 drop into both eyes daily as needed (dry eyes).   psyllium (REGULOID) 0.52 g capsule Take 0.52 g by mouth 3 (three) times a week.   No facility-administered encounter medications on file as of  07/12/2021.    Patient Active Problem List   Diagnosis Date Noted   Dementia ([place]) 05/13/2021   Allergic rhinitis 09/28/2007   Osteoarthritis 09/28/2007   HYPERLIPIDEMIA, MIXED 10/15/2006   Essential hypertension 10/15/2006   GERD 10/15/2006    Conditions to be addressed/monitored: HTN, Dementia, and Osteoarthritis  Care Plan : LCSW Plan of Care  Updates made by Rebekah Chesterfield, LCSW since 07/16/2021 12:00 AM     Problem: Quality of Life (General Plan of Care)      Long-Range Goal:  Quality of Life Maintained   Start Date: 03/07/2021  Expected End Date: 10/10/2021  This Visit's Progress: On track  Recent Progress: On track  Priority: High  Note:   Current Barriers:   Patient unable to consistently perform activities of daily living and needs assistance in order to meet this unmet needs Clinical Goals: Patient will work with CCM LCSW to address needs related to management of health conditions to promote well-being  Clinical Interventions: Assessment of needs, barriers , agencies contacted, as well as how stressors are negatively impacting functioning. All hx provided by patient's daughter, [name] Pt continues to monitor BP readings twice daily and agreed to keep PCP updated 5/25: [name] requested confirmation from LCSW that updated BP readings were uploaded in [place] for PCP's review 6/2: CCM LCSW strongly encouraged Lovey Newcomer to review PCP's recent message in [place] regarding pt's medications, which were filled Lovey Newcomer has agreed to schedule an appt to establish care with a new provider next week Pt sustained an injury resulting in a long bruise and some discomfort. Pt is currently administered tylenol to manage symptoms 6/2: Per Lovey Newcomer, pt's bruising is clearing up and she is reporting less pain and/or discomfort  Active listening / Reflection utilized  Emotional Support Provided Caregiver stress acknowledged  Verbalization of feelings encouraged  Discussed caregiver resources and support:  Inter-disciplinary care team collaboration (see longitudinal plan of care)        Follow Up Plan: Appointment scheduled for SW follow up with client by phone on: 08/23/21      [name], MSW, [facility] [email or web address] Phone [phone] 4:23 PM

## 2021-07-16 ENCOUNTER — Telehealth: Payer: Self-pay | Admitting: Pharmacist

## 2021-07-16 NOTE — Chronic Care Management (AMB) (Signed)
    Chronic Care Management Pharmacy Assistant   Name: Rhonda Weaver  MRN: [id number] DOB: 19-Sep-1930  07/17/2021 APPOINTMENT REMINDER  Called [name], No answer, left message of appointment on 07/17/2021 at 3:30 via telephone visit with Jeni Salles, Pharm D. Notified to have all medications, supplements, blood pressure and/or blood sugar logs available during appointment and to return call if need to reschedule.  Care Gaps: AWV - scheduled 02/14/2022 Last BP - 136/60 on 06/14/2021 Last A1C - 6.1 on 01/12/2019 Covid vaccine - never done Shingrix - postponed Dexa scan - postponed  Star Rating Drug: Atorvastatin 20 mg - last filled 06/24/2021 90 DS at [facility] Losartan 100 mg - last filled 07/05/2021 90 DS at [facility]  Any gaps in medications fill history? No  Gennie Alma [facility]  Catering manager [phone]

## 2021-07-17 ENCOUNTER — Ambulatory Visit: Payer: PPO | Admitting: Pharmacist

## 2021-07-17 DIAGNOSIS — I10 Essential (primary) hypertension: Secondary | ICD-10-CM

## 2021-07-17 DIAGNOSIS — F03C11 Unspecified dementia, severe, with agitation: Secondary | ICD-10-CM

## 2021-07-17 NOTE — Progress Notes (Signed)
Chronic Care Management Pharmacy Note  07/17/2021 Name:  Rhonda Weaver MRN:  [id number] DOB:  May 02, 1930  Summary: BP is still running low per recent home readings but pt denies symptoms of hypotension LDL not at goal < 100  Recommendations/Changes made from today's visit: -Recommended bringing BP cuff to office visit or pharmacy to ensure accuracy -Recommended increasing water intake and switching to Gatorade zero -Consider stopping atorvastatin for primary prevention   Plan: Follow up on BP in 2-3 weeks  Subjective: Rhonda Weaver is an 88 y.o. year old female who is a primary patient of Koberlein, Paris Lore, MD (Inactive).  The CCM team was consulted for assistance with disease management and care coordination needs.    Engaged with patient by telephone for follow up visit in response to provider referral for pharmacy case management and/or care coordination services.   Consent to Services:  The patient was given information about Chronic Care Management services, agreed to services, and gave verbal consent prior to initiation of services.  Please see initial visit note for detailed documentation.   Patient Care Team: Wynn Banker, MD (Inactive) as PCP - General (Family Medicine) Verner Chol, [facility] as Pharmacist (Pharmacist) Yetta Glassman, RN as Case Manager Bridgett Larsson, LCSW as Social Worker (Licensed Clinical Social Worker)  Recent office visits: 07/12/21 Jenel Lucks, LCSW: Patient presented for SW CCM visit.  07/04/21 Mychart message: Losartan-HCTZ changed to losartan due to lower BP readings.   06/27/21 Dudley Major, RN: Patient presented for RN CCM visit.  06/14/21 Theodis Shove, MD: Patient presented for HTN follow up and memory loss follow up.   05/13/21 Theodis Shove, MD: Patient presented for HTN follow up and memory loss follow up.  Decreased atenolol to 1/2 tablet daily. Referred to home health.  04/01/21 Abbe Amsterdam, MD: Patient  presented for video visit for COVID infection. Prescribed molnupiravir.  03/15/21 Theodis Shove, MD: Patient presented for acute cystitis. Encouraged her to complete antibiotics for UTI.  03/08/2021 Theodis Shove MD - Patient was seen for indigestion and additional issues. Started Famotidine 20 mg twice daily. Discontinued PeptoBismol and Magnesium. Follow up if symptoms worsen or fail to improve.    02/13/2021 Theresa Mulligan LPN - Medicare annual wellness exam   01/23/2021 Theodis Shove MD - Patient was seen for thyroid nodule and additional issues. Started Donepezil and discontinued Aspirin. No follow up noted.  Recent consult visits: 05/23/21 Marlowe Kays, PA-C (neurology): Patient presented for dementia follow up. Prescribed Lexapro 5 mg daily for agitation.  03/18/21 Marlowe Kays, PA-C (neurology): Patient presented for dementia initial visit.  Hospital visits: 328/23 Patient presented to the [facility] ED for dizziness. Prescribed meclizine PRN.  01/12/21 Patient presented to the [facility] ED for a fall. Prescribed meclizine PRN.  Objective:  Lab Results  Component Value Date   CREATININE 0.91 05/07/2021   BUN 15 05/07/2021   GFR 53.66 (L) 01/23/2021   GFRNONAA >60 05/07/2021   GFRAA 78 03/06/2008   NA 136 05/07/2021   K 4.0 05/07/2021   CALCIUM 9.6 05/07/2021   CO2 31 05/07/2021   GLUCOSE 128 (H) 05/07/2021    Lab Results  Component Value Date/Time   HGBA1C 6.1 01/12/2019 11:13 AM   HGBA1C 6.3 03/25/2018 09:56 AM   GFR 53.66 (L) 01/23/2021 12:15 PM   GFR 60.13 01/12/2019 11:13 AM    Last diabetic Eye exam: No results found for: HMDIABEYEEXA  Last diabetic Foot exam: No results found  for: HMDIABFOOTEX   Lab Results  Component Value Date   CHOL 176 05/01/2021   HDL 44.00 05/01/2021   LDLCALC 92 05/01/2021   LDLDIRECT 145.0 01/23/2021   TRIG 200.0 (H) 05/01/2021   CHOLHDL 4 05/01/2021       Latest Ref Rng & Units  01/23/2021   12:15 PM 01/12/2019   11:13 AM 03/11/2013    8:17 AM  Hepatic Function  Total Protein 6.0 - 8.3 g/dL 7.6   6.7   6.9    Albumin 3.5 - 5.2 g/dL 4.1   4.0   3.5    AST 0 - 37 U/L $Remo'23   25   18    'KrhtP$ ALT 0 - 35 U/L $Remo'16   26   16    'MdlXZ$ Alk Phosphatase 39 - 117 U/L 52   46   44    Total Bilirubin 0.2 - 1.2 mg/dL 0.8   0.7   0.8    Bilirubin, Direct 0.0 - 0.3 mg/dL   0.0      Lab Results  Component Value Date/Time   TSH 1.37 03/18/2021 03:48 PM   TSH 0.95 01/23/2021 12:15 PM       Latest Ref Rng & Units 05/07/2021    8:05 AM 01/23/2021   12:15 PM 01/12/2021   12:29 PM  CBC  WBC 4.0 - 10.5 K/uL 6.7   8.7   7.0    Hemoglobin 12.0 - 15.0 g/dL 14.1   14.4   14.1    Hematocrit 36.0 - 46.0 % 42.2   43.1   41.5    Platelets 150 - 400 K/uL 266   252.0   251      Lab Results  Component Value Date/Time   VD25OH 31.46 01/23/2021 12:15 PM   VD25OH 31 12/31/2011 08:52 AM    Clinical ASCVD: No  The ASCVD Risk score (Arnett DK, et al., 2019) failed to calculate for the following reasons:   The 2019 ASCVD risk score is only valid for ages 67 to 48       05/13/2021    9:43 AM 03/08/2021    3:52 PM 02/13/2021   11:22 AM  Depression screen PHQ 2/9  Decreased Interest 3 3 0  Down, Depressed, Hopeless 2 2 0  PHQ - 2 Score 5 5 0  Altered sleeping 3 3 0  Tired, decreased energy 3 3 0  Change in appetite 2 1 0  Feeling bad or failure about yourself  2 0 0  Trouble concentrating 2 0 0  Moving slowly or fidgety/restless 3 3 0  Suicidal thoughts 0 0 0  PHQ-9 Score 20 15 0      Social History   Tobacco Use  Smoking Status Never  Smokeless Tobacco Never   BP Readings from Last 3 Encounters:  06/14/21 136/60  05/13/21 140/80  05/07/21 (!) 151/124   Pulse Readings from Last 3 Encounters:  06/14/21 67  05/13/21 (!) 53  05/07/21 84   Wt Readings from Last 3 Encounters:  06/14/21 113 lb 4.8 oz (51.4 kg)  05/13/21 112 lb 9.6 oz (51.1 kg)  03/18/21 116 lb (52.6 kg)   BMI Readings  from Last 3 Encounters:  06/14/21 21.41 kg/m  05/13/21 21.28 kg/m  03/18/21 21.92 kg/m    Assessment/Interventions: Review of patient past medical history, allergies, medications, health status, including review of consultants reports, laboratory and other test data, was performed as part of comprehensive evaluation and provision of chronic care management services.  SDOH:  (Social Determinants of Health) assessments and interventions performed: No  SDOH Screenings   Alcohol Screen: Not on file  Depression (PHQ2-9): Medium Risk   PHQ-2 Score: 20  Financial Resource Strain: Low Risk    Difficulty of Paying Living Expenses: Not hard at all  Food Insecurity: No Food Insecurity   Worried About Charity fundraiser in the Last Year: Never true   Ran Out of Food in the Last Year: Never true  Housing: Low Risk    Last Housing Risk Score: 0  Physical Activity: Sufficiently Active   Days of Exercise per Week: 5 days   Minutes of Exercise per Session: 30 min  Social Connections: Socially Isolated   Frequency of Communication with Friends and Family: More than three times a week   Frequency of Social Gatherings with Friends and Family: More than three times a week   Attends Religious Services: Never   Marine scientist or Organizations: No   Attends Archivist Meetings: Never   Marital Status: Widowed  Stress: No Stress Concern Present   Feeling of Stress : Not at all  Tobacco Use: Low Risk    Smoking Tobacco Use: Never   Smokeless Tobacco Use: Never   Passive Exposure: Not on file  Transportation Needs: No Transportation Needs   Lack of Transportation (Medical): No   Lack of Transportation (Non-Medical): No    CCM Care Plan  Allergies  Allergen Reactions   Fenofibrate     REACTION: pt states intolerant   Gemfibrozil     REACTION: pt states intolerant   Niacin     REACTION: pt states intolerant   Penicillins     REACTION: Hx of arm swelling$RemoveBefore' \\T'[id number]$ \ itching  after shot   Sulfamethoxazole-Trimethoprim     REACTION: "deathly sick"  w/  N\T\V   Sulfonamide Derivatives     REACTION: nausea and vomiting    Medications Reviewed Today     Reviewed by Viona Gilmore, [facility] (Pharmacist) on 07/17/21 at 1531  Med List Status: <None>   Medication Order Taking? Sig Documenting Provider Last Dose Status Informant  acetaminophen (TYLENOL) 500 MG tablet [id number] No Take 500 mg by mouth every 6 (six) hours as needed for mild pain or headache. [provider] Taking Active Child  atorvastatin (LIPITOR) 20 MG tablet [id number] No Take 1 tablet (20 mg total) by mouth daily. Caren Macadam, MD Taking Active Child  donepezil (ARICEPT) 10 MG tablet [id number]  Take 1 tablet (10 mg total) by mouth daily. Rondel Jumbo, PA-C  Active   [facility]) [id number] No Take 237 mLs by mouth 2 (two) times daily between meals. [provider] Taking Active   escitalopram (LEXAPRO) 5 MG tablet [id number]  Take 1 tablet (5 mg total) by mouth daily. Rondel Jumbo, PA-C  Active   famotidine (PEPCID) 20 MG tablet [id number] No Take 1 tablet by mouth twice daily Koberlein, Junell C, MD Taking Active   fluticasone (FLONASE) 50 MCG/ACT nasal spray [id number] No USE TWO SPRAY(S) IN EACH NOSTRIL ONCE DAILY  Patient taking differently: Place 2 sprays into both nostrils daily as needed for allergies.   Lucretia Kern, DO Taking Active Child  fluticasone (FLOVENT HFA) 44 MCG/ACT inhaler [id number] No Inhale 2 puffs into the lungs as needed.  Patient taking differently: Inhale 2 puffs into the lungs daily as needed (shortness of breath).   Caren Macadam, MD Taking Active Child  losartan (COZAAR) 100 MG tablet  [id number]  Take 1 tablet (100 mg total) by mouth daily. Caren Macadam, MD  Active   meclizine (ANTIVERT) 12.5 MG tablet [id number] No Take 1 tablet (12.5 mg total) by mouth 3 (three) times daily as needed for dizziness. Tegeler, Gwenyth Allegra, MD Taking  Active   meclizine (ANTIVERT) 25 MG tablet [id number]  Take 1 tablet (25 mg total) by mouth 3 (three) times daily as needed for dizziness. Caren Macadam, MD  Active   omeprazole (PRILOSEC) 20 MG capsule [id number]  Take 1 capsule (20 mg total) by mouth daily. Caren Macadam, MD  Active   potassium chloride (KLOR-CON M) 10 MEQ tablet [id number] No Take 1 tablet (10 mEq total) by mouth daily. Caren Macadam, MD Taking Active Child  Propylene Glycol (SYSTANE BALANCE) 0.6 % SOLN [id number] No Place 1 drop into both eyes daily as needed (dry eyes). [provider] Taking Active   psyllium (REGULOID) 0.52 g capsule [id number] No Take 0.52 g by mouth 3 (three) times a week. [provider] Taking Active             Patient Active Problem List   Diagnosis Date Noted   Dementia ([place]) 05/13/2021   Allergic rhinitis 09/28/2007   Osteoarthritis 09/28/2007   HYPERLIPIDEMIA, MIXED 10/15/2006   Essential hypertension 10/15/2006   GERD 10/15/2006    Immunization History  Administered Date(s) Administered   Influenza Split 11/11/2010, 11/21/2011, 10/11/2012   Influenza Whole 11/03/2007, 11/17/2008, 11/13/2009   Influenza, High Dose Seasonal PF 11/07/2018   Influenza-Unspecified 11/05/2013, 11/10/2016, 10/11/2017   PNEUMOCOCCAL CONJUGATE-20 01/23/2021   Pneumococcal Conjugate-13 12/06/2013   Pneumococcal Polysaccharide-23 11/10/2016   Tdap 07/01/2013   Patient's daughter reports PT has stopped. They were bringing her for walks and checking her blood pressure but insurance isn't covering it anymore. Patient had her crystals realigned when they came out and she improved significantly. She has not complained of any dizziness since but her blood pressure remains low.  Patient does not drink water and does not exercise or move around much. She mostly drinks gatorade or sodas right now.   Conditions to be addressed/monitored:  Hypertension, Hyperlipidemia, GERD,  Osteoarthritis and Constipation  Conditions addressed this visit: Hypertension, hyperlipidemia  Care Plan : CCM Pharmacy Care Plan  Updates made by Viona Gilmore, [place] since 07/17/2021 12:00 AM     Problem: Problem: Hypertension, Hyperlipidemia, GERD, Osteoarthritis and Constipation      Long-Range Goal: Patient-Specific Goal   Start Date: 06/19/2020  Expected End Date: 06/19/2021  Recent Progress: On track  Priority: High  Note:   Current Barriers:  Unable to achieve control of blood pressure and cholesterol  Unable to self administer medications as prescribed  Pharmacist Clinical Goal(s):  Patient will achieve adherence to monitoring guidelines and medication adherence to achieve therapeutic efficacy achieve control of blood pressure as evidenced by home and office blood pressure readings achieve ability to self administer medications as prescribed through use of pillbox as evidenced by patient report through collaboration with PharmD and provider.   Interventions: 1:1 collaboration with Caren Macadam, MD regarding development and update of comprehensive plan of care as evidenced by provider attestation and co-signature Inter-disciplinary care team collaboration (see longitudinal plan of care) Comprehensive medication review performed; medication list updated in electronic medical record  Hypertension (BP goal <140/90) -Controlled -Current treatment: losartan 100 mg, 1 tablet daily - in AM - Appropriate, Effective, Query Safe, Accessible -Medications previously tried: valsartan, amlodipine, atenolol -Current home readings: 119/53 HR  87, 137/80 HR 68, 126/73 HR 76, 117/51, 115/67 HR 80 - arm cuff (checking an hour after meds and before bedtime) -Current dietary habits: did not discuss -Current exercise habits: walking some -Denies hypotensive/hypertensive symptoms -Educated on BP goals and benefits of medications for prevention of heart attack, stroke and kidney  damage; Importance of home blood pressure monitoring; Proper BP monitoring technique; -Counseled to monitor BP at home twice weekly, document, and provide log at future appointments -Counseled on diet and exercise extensively Recommended to continue current medication Recommended bringing BP cuff to next in office appointment.  Hyperlipidemia: (LDL goal < 100) -Uncontrolled -Current treatment: atorvastatin 20mg , 1 tablet daily - Appropriate, Query effective, Safe, Accessible -Medications previously tried: fenofibrate, gemfibrozil, niacin -Current dietary patterns: did not discuss -Current exercise habits: walking some -Educated on Cholesterol goals;  Benefits of statin for ASCVD risk reduction; Importance of limiting foods high in cholesterol; -Counseled on diet and exercise extensively Recommended to continue current medication  GERD (Goal: minimize symptoms) -Controlled -Current treatment  Pepto-Bismol: uses for upset stomach - not currently using Tums as needed for heartburn - Appropriate, Effective, Safe, Accessible -Medications previously tried: n/a  -Recommended avoiding use of Pepto-Bismol (interaction with aspirin use- both are salicylates and may increase incidence of bleeding.)   Congestion (Goal: minimize symptoms) -Controlled -Current treatment  Flovent HFA inhaler, 2 puffs as needed (patient uses as needed for severe bad congestion- last time.she used it was about 1 month ago).  fluticasone nasal spray- uses as needed -Medications previously tried: none  -Recommended to continue current medication  Osteoarthritis (Goal: minimize pain) -Controlled -Current treatment  Tylenol 500mg  every six hours as needed - Appropriate, Effective, Safe, Accessible -Medications previously tried: n/a  -Recommended to continue current medication  Constipation (Goal: regular bowel movements) -Controlled -Current treatment  milk of magnesia (patient uses every 2 to 3 weeks as  needed) - Appropriate, Effective, Safe, Accessible -Medications previously tried: none  -Recommended to continue current medication  Health Maintenance -Vaccine gaps: COVID (declines), shingrix -Current therapy:  None -Educated on Cost vs benefit of each product must be carefully weighed by individual consumer -Patient is satisfied with current therapy and denies issues -Recommended to continue as is  Patient Goals/Self-Care Activities Patient will:  - take medications as prescribed focus on medication adherence by using a pillbox check blood pressure weekly, document, and provide at future appointments  Follow Up Plan: The care management team will reach out to the patient again over the next 30 days.         Medication Assistance: None required.  Patient affirms current coverage meets needs.  Compliance/Adherence/Medication fill history: Care Gaps: Shingrix, DEXA, COVID booster Last BP - 136/60 on 06/14/2021 Last A1C - 6.1 on 01/12/2019  Star-Rating Drugs: Atorvastatin 20 mg - last filled 06/24/2021 90 DS at [facility] Losartan 100 mg - last filled [facility]/05/2021 [facility]  Patient's preferred pharmacy is:  [id number] - [place], [address] [place] [address] Phone: [phone] Fax: [phone]  [place] [address] [facility], [place] [address] Phone: [phone] Fax: [phone]   Uses pill box? Yes - weekly - AM/PM Pt endorses 100% compliance - never misses a dose (daughter reports she does miss doses as she checks behind her in the pillbox)  We discussed: Benefits of medication synchronization, packaging and delivery as well as enhanced pharmacist oversight with [employer]. Patient decided to: Continue current medication management  strategy  Care Plan and Follow Up Patient Decision:  Patient agrees to Care Plan and Follow-up.  Plan: The care management team will  reach out to the patient again over the next 21 days.  Jeni Salles, PharmD [facility] Clinical Pharmacist [facility] [place]

## 2021-07-29 ENCOUNTER — Other Ambulatory Visit: Payer: Self-pay | Admitting: Family Medicine

## 2021-07-29 NOTE — Telephone Encounter (Signed)
Pt need New RX for potassium chloride (KLOR-CON M) 10 MEQ tablet sent to  [id number]- [place], [place] - [address] Phone:  [phone] Fax:  [phone]

## 2021-07-30 ENCOUNTER — Ambulatory Visit: Payer: HMO

## 2021-07-30 DIAGNOSIS — F03C11 Unspecified dementia, severe, with agitation: Secondary | ICD-10-CM

## 2021-07-30 DIAGNOSIS — E782 Mixed hyperlipidemia: Secondary | ICD-10-CM

## 2021-07-30 DIAGNOSIS — I10 Essential (primary) hypertension: Secondary | ICD-10-CM

## 2021-07-30 MED ORDER — POTASSIUM CHLORIDE CRYS ER 10 MEQ PO TBCR: 10.0000 meq | EXTENDED_RELEASE_TABLET | Freq: Every day | ORAL | 1 refills | Status: AC

## 2021-07-30 NOTE — Patient Instructions (Signed)
Visit Information  Thank you for allowing me to share the care management and care coordination services that are available to you as part of your health plan and services through your primary care provider and medical home. Please reach out to me at [phone] if the care management/care coordination team may be of assistance to you in the future.   Maricel Swartzendruber RN, BSN,CCM, CDE Care Management Coordinator [facility] [phone]   

## 2021-07-30 NOTE — Chronic Care Management (AMB) (Signed)
Chronic Care Management   CCM RN Visit Note  07/30/2021 Name: Rhonda Weaver MRN: [id number] DOB: [birth year]  Subjective: Rhonda Weaver is a 88 y.o. year old female who is a primary care patient of Koberlein, Paris Lore, MD (Inactive). The care management team was consulted for assistance with disease management and care coordination needs.    Engaged with patient by telephone for follow up visit in response to provider referral for case management and/or care coordination services.   Consent to Services:  The patient was given information about Chronic Care Management services, agreed to services, and gave verbal consent prior to initiation of services.  Please see initial visit note for detailed documentation.   Patient agreed to services and verbal consent obtained.   Assessment: Review of patient past medical history, allergies, medications, health status, including review of consultants reports, laboratory and other test data, was performed as part of comprehensive evaluation and provision of chronic care management services.   SDOH (Social Determinants of Health) assessments and interventions performed:    CCM Care Plan  Allergies  Allergen Reactions   Fenofibrate     REACTION: pt states intolerant   Gemfibrozil     REACTION: pt states intolerant   Niacin     REACTION: pt states intolerant   Penicillins     REACTION: Hx of arm swelling \\T \ itching after shot   Sulfamethoxazole-Trimethoprim     REACTION: "deathly sick"  w/  N\T\V   Sulfonamide Derivatives     REACTION: nausea and vomiting    Outpatient Encounter Medications as of 07/30/2021  Medication Sig   acetaminophen (TYLENOL) 500 MG tablet Take 500 mg by mouth every 6 (six) hours as needed for mild pain or headache.   atorvastatin (LIPITOR) 20 MG tablet Take 1 tablet (20 mg total) by mouth daily.   donepezil (ARICEPT) 10 MG tablet Take 1 tablet (10 mg total) by mouth daily.   Ensure (ENSURE) Take 237 mLs by  mouth 2 (two) times daily between meals.   escitalopram (LEXAPRO) 5 MG tablet Take 1 tablet (5 mg total) by mouth daily.   famotidine (PEPCID) 20 MG tablet Take 1 tablet by mouth twice daily   fluticasone (FLONASE) 50 MCG/ACT nasal spray USE TWO SPRAY(S) IN EACH NOSTRIL ONCE DAILY (Patient taking differently: Place 2 sprays into both nostrils daily as needed for allergies.)   fluticasone (FLOVENT HFA) 44 MCG/ACT inhaler Inhale 2 puffs into the lungs as needed. (Patient taking differently: Inhale 2 puffs into the lungs daily as needed (shortness of breath).)   losartan (COZAAR) 100 MG tablet Take 1 tablet (100 mg total) by mouth daily.   meclizine (ANTIVERT) 25 MG tablet Take 1 tablet (25 mg total) by mouth 3 (three) times daily as needed for dizziness.   omeprazole (PRILOSEC) 20 MG capsule Take 1 capsule (20 mg total) by mouth daily.   potassium chloride (KLOR-CON M) 10 MEQ tablet Take 1 tablet (10 mEq total) by mouth daily.   Propylene Glycol (SYSTANE BALANCE) 0.6 % SOLN Place 1 drop into both eyes daily as needed (dry eyes).   psyllium (REGULOID) 0.52 g capsule Take 0.52 g by mouth 3 (three) times a week.   No facility-administered encounter medications on file as of 07/30/2021.    Patient Active Problem List   Diagnosis Date Noted   Dementia (HCC) 05/13/2021   Allergic rhinitis 09/28/2007   Osteoarthritis 09/28/2007   HYPERLIPIDEMIA, MIXED 10/15/2006   Essential hypertension 10/15/2006   GERD 10/15/2006  Conditions to be addressed/monitored:HTN, HLD, and Dementia  Care Plan : RN Care Manager Plan of Care  Updates made by Dimitri Ped, RN since 07/30/2021 12:00 AM  Completed 07/30/2021   Problem: Chronic Disease Management and Care Coordination Needs (Dementia,HTN, osteoarthritis and HLD) Resolved 07/30/2021  Priority: High     Long-Range Goal: Establish Plan of Care for Chronic Disease Management Needs (Dementia,HTN, osteoarthritis and HLD) Completed 07/30/2021  Start Date:  01/25/2021  Expected End Date: 11/10/2021  Priority: High  Note:   Case closed goals met Current Barriers:  Knowledge Deficits related to plan of care for management of CAD, HLD, Dementia, and Osteoarthritis  Care Coordination needs related to Level of care concerns, ADL IADL limitations, Memory Deficits, Inability to perform ADL's independently, Inability to perform IADL's independently, and Lacks knowledge of community resource: personal care services Chronic Disease Management support and education needs related to HTN, HLD, Dementia, and Osteoarthritis  Spoke with daughter [name] pt gives consent to speak with her. States she her dizziness is better after the therapist did the maneuver on her. States she did have a fall were her foot slipped out of her slippers.  States the bruising is gone. States her B/P has been ranging around 113/63 to 179/86. States she is still trying to decide if and when to move pt to memory care.  States she is giving pt her medications and making sure she is eating and drinking enough fluids. States she forgets she has eaten.  States she does give her  2 Ensures a day .  States that pt is having less confusion and is sleeping  better at night.     States she is not sure pt is bathing but she dresses herself.  States she has alarms on the doors so she knows if she tries to go outside and she will lock the door to keep her from going outside without her.   RNCM Clinical Goal(s):  Patient will verbalize understanding of plan for management of HTN, HLD, Dementia, and Osteoarthritis as evidenced by voiced adherence to plan of care verbalize basic understanding of  HTN, HLD, Dementia, and Osteoarthritis disease process and self health management plan as evidenced by voiced understanding and teach back take all medications exactly as prescribed and will call provider for medication related questions as evidenced by dispense report and pt verbalization attend all scheduled  medical appointments: Neurology 11/10/21, CCM LCSW 08/23/21 as evidenced by medical records demonstrate Improved adherence to prescribed treatment plan for HTN, HLD, Dementia, and Osteoarthritis as evidenced by readings within limits, voiced adherence to plan of care continue to work with RN Care Manager to address care management and care coordination needs related to  HTN, HLD, Dementia, and Osteoarthritis as evidenced by adherence to CM Team Scheduled appointments work with Education officer, museum to address  related to the management of Level of care concerns, ADL IADL limitations, Memory Deficits, Inability to perform ADL's independently, Inability to perform IADL's independently, and Lacks knowledge of community resource: caregiver stress related to the management of HTN, HLD, Dementia, and Osteoarthritis as evidenced by review of EMR and patient or Education officer, museum report work with Data processing manager care guide to address needs related to  ADL IADL limitations, Memory Deficits, Inability to perform ADL's independently, Inability to perform IADL's independently, and Lacks knowledge of community resource: personal care services as evidenced by patient and/or community resource care guide support through collaboration with Consulting civil engineer, provider, and care team.   Interventions: 1:1  collaboration with primary care provider regarding development and update of comprehensive plan of care as evidenced by provider attestation and co-signature Inter-disciplinary care team collaboration (see longitudinal plan of care) Evaluation of current treatment plan related to  self management and patient's adherence to plan as established by provider   Hyperlipidemia Interventions:  (Status:  Goal Met.) Long Term Goal Medication review performed; medication list updated in electronic medical record.  Provider established cholesterol goals reviewed Counseled on importance of regular laboratory monitoring as prescribed Reviewed  role and benefits of statin for ASCVD risk reduction  Hypertension Interventions:  (Status:  Goal Met.) Long Term Goal Last practice recorded BP readings:  BP Readings from Last 3 Encounters:  06/14/21 136/60  05/13/21 140/80  05/07/21 (!) 151/124  Most recent eGFR/CrCl: No results found for: EGFR  No components found for: CRCL  Evaluation of current treatment plan related to hypertension self management and patient's adherence to plan as established by provider Provided education to patient re: stroke prevention, s/s of heart attack and stroke Reviewed medications with patient and discussed importance of compliance Discussed plans with patient for ongoing care management follow up and provided patient with direct contact information for care management team Advised patient, providing education and rationale, to monitor blood pressure daily and record, calling PCP for findings outside established parameters Provided education on prescribed diet low sodium Discussed complications of poorly controlled blood pressure such as heart disease, stroke, circulatory complications, vision complications, kidney impairment, sexual dysfunction Reinforced importance of keeping B/P in range to help with overall health and  dementia.  Reinforced goals for B/P and to call provider if pt has lower readings with symptoms   Dementia:  (Status:  Goal Met.)  Long Term Goal Evaluation of current treatment plan related to misuse of: with behavioral disturbance Reviewed medications including Aricept , Emotional Support Provided to patient/caregiver, Sleep hygiene recommendations and education provided, Consideration of in-home help encouraged , Referred to LCSW for assistance with Guardianship discussion, Discussed importance of attendance to all provider appointments, Advised to contact provider for new or worsening symptoms, and  Allowed caregiver to express her feeling and frustrations with caring for pt.  Reinforced  to start looking at facilities.  Reinforced to ways to help her assist pt with bathing and toileting. Reinforced to try to redirect pt when she tries to leave. Reinforced importance of pt drinking adequate amounts of fluids and taking medications as directed, Reinforced with caregiver ways to handle stress and care of pt  Patient Goals/Self-Care Activities: Take all medications as prescribed Attend all scheduled provider appointments Call provider office for new concerns or questions  check blood pressure 3 times per week choose a place to take my blood pressure (home, clinic or office, retail store) take blood pressure log to all doctor appointments begin an exercise program eat more whole grains, fruits and vegetables, lean meats and healthy fats limit salt intake to $RemoveB'2300mg'VkVOuIzk$ /day call for medicine refill 2 or 3 days before it runs out take all medications exactly as prescribed call doctor with any symptoms you believe are related to your medicine  Follow Up Plan:  The patient has been provided with contact information for the care management team and has been advised to call with any health related questions or concerns.  No further follow up required: Case closed RNCM goals met       Plan:The patient has been provided with contact information for the care management team and has been advised to call with any  health related questions or concerns.  No further follow up required: Case closed RNCM goals met Peter Garter RN, [facility], CDE Care Management Coordinator [facility] [phone]

## 2021-08-08 ENCOUNTER — Telehealth: Payer: Self-pay | Admitting: Pharmacist

## 2021-08-08 NOTE — Chronic Care Management (AMB) (Signed)
    Chronic Care Management Pharmacy Assistant   Name: Rhonda Weaver  MRN: [id number] DOB: 05-24-1930   Rhonda Weaver Rhonda Weaver's Rhonda Weaver [name] was reminded of a phone call from Jeni Salles, [place] D tomorrow 08/09/2021, [name] will be available for this call and states 11:00 would be a good time for them.  [place] Pharmacist Assistant [phone]

## 2021-08-09 ENCOUNTER — Other Ambulatory Visit: Payer: Self-pay | Admitting: Family

## 2021-08-09 ENCOUNTER — Telehealth: Payer: Self-pay | Admitting: Pharmacist

## 2021-08-09 DIAGNOSIS — E785 Hyperlipidemia, unspecified: Secondary | ICD-10-CM

## 2021-08-09 DIAGNOSIS — F0390 Unspecified dementia without behavioral disturbance: Secondary | ICD-10-CM | POA: Diagnosis not present

## 2021-08-09 DIAGNOSIS — M1990 Unspecified osteoarthritis, unspecified site: Secondary | ICD-10-CM

## 2021-08-09 DIAGNOSIS — I10 Essential (primary) hypertension: Secondary | ICD-10-CM

## 2021-08-09 MED ORDER — HYDROCHLOROTHIAZIDE 12.5 MG PO CAPS: 12.5000 mg | ORAL_CAPSULE | Freq: Every day | ORAL | 1 refills | Status: DC

## 2021-08-09 NOTE — Telephone Encounter (Signed)
Called patient's daughter Rhonda Weaver to follow up on low BP readings from earlier this month. Patient's BP readings are as follows: 128/59 HR 86, 142/67, 188/84, 167/71 HR 85, 141/72 HR 79, 184/78, 163/83 179/86 HR 96   Patient's daughter is checking her BP half an hour after the losartan in the morning and then some evenings. She did note that readings have been much higher than previously with the change in medications and are higher in the morning and the evening. Due to patient's history of lows, could consider adding back 12.5 mg of HCTZ to be used PRN with readings > 160/70. Will route to Dutch Quint to see if this would be possible or if patient needs in office visit for further evaluation.

## 2021-08-09 NOTE — Telephone Encounter (Signed)
Called patient's daughter back to make her aware of the change. Plan for patient to take HCTZ 12.5 mg as needed for BP readings > 160/70. Patient's daughter will check BP at least 1 hour after losartan and if elevated, will have patient take HCTZ. Set up follow up appt with new PCP in 3-4 weeks.  Patient's daughter took notes and verbalized her understanding. She is aware to call back here with any questions/concerns in the meantime.  Will follow up after PCP appt.

## 2021-08-15 ENCOUNTER — Telehealth: Payer: Self-pay | Admitting: Pharmacist

## 2021-08-15 NOTE — Chronic Care Management (AMB) (Signed)
Chronic Care Management Pharmacy Assistant   Name: Rhonda Weaver  MRN: [id number] DOB: 03/13/1930  Reason for Encounter: Disease State / Hypertension Assessment Call   Conditions to be addressed/monitored: HTN  Recent office visits:  None  Recent consult visits:  None  Hospital visits:  None  Medications: Outpatient Encounter Medications as of 08/15/2021  Medication Sig   acetaminophen (TYLENOL) 500 MG tablet Take 500 mg by mouth every 6 (six) hours as needed for mild pain or headache.   atorvastatin (LIPITOR) 20 MG tablet Take 1 tablet (20 mg total) by mouth daily.   donepezil (ARICEPT) 10 MG tablet Take 1 tablet (10 mg total) by mouth daily.   Ensure (ENSURE) Take 237 mLs by mouth 2 (two) times daily between meals.   escitalopram (LEXAPRO) 5 MG tablet Take 1 tablet (5 mg total) by mouth daily.   famotidine (PEPCID) 20 MG tablet Take 1 tablet by mouth twice daily   fluticasone (FLONASE) 50 MCG/ACT nasal spray USE TWO SPRAY(S) IN EACH NOSTRIL ONCE DAILY (Patient taking differently: Place 2 sprays into both nostrils daily as needed for allergies.)   fluticasone (FLOVENT HFA) 44 MCG/ACT inhaler Inhale 2 puffs into the lungs as needed. (Patient taking differently: Inhale 2 puffs into the lungs daily as needed (shortness of breath).)   hydrochlorothiazide (MICROZIDE) 12.5 MG capsule Take 1 capsule (12.5 mg total) by mouth daily.   losartan (COZAAR) 100 MG tablet Take 1 tablet (100 mg total) by mouth daily.   meclizine (ANTIVERT) 25 MG tablet Take 1 tablet (25 mg total) by mouth 3 (three) times daily as needed for dizziness.   omeprazole (PRILOSEC) 20 MG capsule Take 1 capsule (20 mg total) by mouth daily.   potassium chloride (KLOR-CON M) 10 MEQ tablet Take 1 tablet (10 mEq total) by mouth daily.   Propylene Glycol (SYSTANE BALANCE) 0.6 % SOLN Place 1 drop into both eyes daily as needed (dry eyes).   psyllium (REGULOID) 0.52 g capsule Take 0.52 g by mouth 3 (three) times a week.    No facility-administered encounter medications on file as of 08/15/2021.  Fill History: AMLODIPINE '5MG'$  TAB 06/27/2021 90   ATORVASTATIN '20MG'$    TAB 06/24/2021 90   DONEPEZIL HCL 10 MG TABLET 07/28/2021 30   ESCITALOPRAM 5 MG TABLET 07/19/2021 30   FAMOTIDINE '20MG'$  TAB 05/17/2021 90   HYDROCHLOROTHIAZIDE 12.5 MG CP 08/09/2021 30   LOSARTAN POTASSIUM 100 MG TAB 07/05/2021 90   OMEPRAZOLE '20MG'$  CAP 07/06/2021 90   KLOR-CON M10 TABLET 07/30/2021 90   Reviewed chart prior to disease state call. Spoke with patient regarding BP  Recent Office Vitals: BP Readings from Last 3 Encounters:  06/14/21 136/60  05/13/21 140/80  05/07/21 (!) 151/124   Pulse Readings from Last 3 Encounters:  06/14/21 67  05/13/21 (!) 53  05/07/21 84    Wt Readings from Last 3 Encounters:  06/14/21 113 lb 4.8 oz (51.4 kg)  05/13/21 112 lb 9.6 oz (51.1 kg)  03/18/21 116 lb (52.6 kg)     Kidney Function Lab Results  Component Value Date/Time   CREATININE 0.91 05/07/2021 08:05 AM   CREATININE 0.95 01/23/2021 12:15 PM   GFR 53.66 (L) 01/23/2021 12:15 PM   GFRNONAA >60 05/07/2021 08:05 AM   GFRAA 78 03/06/2008 08:13 AM       Latest Ref Rng & Units 05/07/2021    8:05 AM 01/23/2021   12:15 PM 01/12/2021   12:29 PM  BMP  Glucose 70 - 99 mg/dL  128  109  91   BUN 8 - 23 mg/dL '15  17  12   '$ Creatinine 0.44 - 1.00 mg/dL 0.91  0.95  1.02   Sodium 135 - 145 mmol/L 136  139  140   Potassium 3.5 - 5.1 mmol/L 4.0  3.3  3.6   Chloride 98 - 111 mmol/L 97  97  105   CO2 22 - 32 mmol/L 31  34  26   Calcium 8.9 - 10.3 mg/dL 9.6  10.1  9.4    Current antihypertensive regimen:  HCTZ 12.5 mg daily as instructed Losartan 100 mg daily  How often are you checking your Blood Pressure? Patients daughter is checking her blood pressures 1 or 2 times daily.   Current home BP readings: 08/10/2021 - 116/52 AM and 154/78 PM                                                    08/11/2021 - 129/63 AM                                                     08/12/2021 - 141/79 AM and 131/67 PM                                                    08/14/2021 - 155/64 AM                                                    08/15/2021 - 138/67 AM  What recent interventions/DTPs have been made by any provider to improve Blood Pressure control since last CPP Visit: Patient is to take HCTZ 12.5 mg for blood pressure readings above 160/70.   Any recent hospitalizations or ED visits since last visit with CPP? No recent hospital visits  What diet changes have been made to improve Blood Pressure Control?  Patients follows no specific diet Breakfast - patient will have a bowl of cereal or an egg sandwich Lunch - patient will have a sandwich with chips or soup  Dinner - patient will have a meal with meat and vegetable or a sandwich  What exercise is being done to improve your Blood Pressure Control?  Patient gets very little exercise, occasional walking  Adherence Review: Is the patient currently on ACE/ARB medication? Yes Does the patient have >5 day gap between last estimated fill dates? No  Care Gaps: AWV - scheduled 02/14/2021 Last BP - 136/60 on 06/14/2021 Last A1C - 6.1 on 01/12/2019 Covid vaccine - never done Shingrix - postponed Dexa scan - postponed  Star Rating Drugs: Atorvastatin 20 mg - last filled 06/24/2021 90 DS at [facility] Losartan 100 mg - last filled 07/05/2021 90 DS at [place] Pharmacist Assistant [phone]

## 2021-08-23 ENCOUNTER — Ambulatory Visit: Payer: HMO | Admitting: Licensed Clinical Social Worker

## 2021-08-23 DIAGNOSIS — F03C11 Unspecified dementia, severe, with agitation: Secondary | ICD-10-CM

## 2021-08-23 DIAGNOSIS — M1991 Primary osteoarthritis, unspecified site: Secondary | ICD-10-CM

## 2021-08-23 NOTE — Chronic Care Management (AMB) (Signed)
Care Management Clinical Social Work Note  08/23/2021 Name: Rhonda Weaver MRN: [id number] DOB: 1931-02-07  Rhonda Weaver is a 88 y.o. year old female who is a primary care patient of Koberlein, Steele Berg, MD (Inactive).  The Care Management team was consulted for assistance with chronic disease management and coordination needs.  Engaged with patient's daughter by telephone for follow up visit in response to provider referral for social work chronic care management and care coordination services  Consent to Services:  [name] was given information about Care Management services today including:  Care Management services includes personalized support from designated clinical staff supervised by her physician, including individualized plan of care and coordination with other care providers 24/7 contact phone numbers for assistance for urgent and routine care needs. The patient may stop case management services at any time by phone call to the office staff.  Patient agreed to services and consent obtained.   Assessment: Review of patient past medical history, allergies, medications, and health status, including review of relevant consultants reports was performed today as part of a comprehensive evaluation and provision of chronic care management and care coordination services.  SDOH (Social Determinants of Health) assessments and interventions performed:    Advanced Directives Status: Not addressed in this encounter.  Care Plan  Allergies  Allergen Reactions   Fenofibrate     REACTION: pt states intolerant   Gemfibrozil     REACTION: pt states intolerant   Niacin     REACTION: pt states intolerant   Penicillins     REACTION: Hx of arm swelling' \\T'$ \ itching after shot   Sulfamethoxazole-Trimethoprim     REACTION: "deathly sick"  w/  N\T\V   Sulfonamide Derivatives     REACTION: nausea and vomiting    Outpatient Encounter Medications as of 08/23/2021  Medication Sig    acetaminophen (TYLENOL) 500 MG tablet Take 500 mg by mouth every 6 (six) hours as needed for mild pain or headache.   atorvastatin (LIPITOR) 20 MG tablet Take 1 tablet (20 mg total) by mouth daily.   donepezil (ARICEPT) 10 MG tablet Take 1 tablet (10 mg total) by mouth daily.   Ensure (ENSURE) Take 237 mLs by mouth 2 (two) times daily between meals.   escitalopram (LEXAPRO) 5 MG tablet Take 1 tablet (5 mg total) by mouth daily.   famotidine (PEPCID) 20 MG tablet Take 1 tablet by mouth twice daily   fluticasone (FLONASE) 50 MCG/ACT nasal spray USE TWO SPRAY(S) IN EACH NOSTRIL ONCE DAILY (Patient taking differently: Place 2 sprays into both nostrils daily as needed for allergies.)   fluticasone (FLOVENT HFA) 44 MCG/ACT inhaler Inhale 2 puffs into the lungs as needed. (Patient taking differently: Inhale 2 puffs into the lungs daily as needed (shortness of breath).)   hydrochlorothiazide (MICROZIDE) 12.5 MG capsule Take 1 capsule (12.5 mg total) by mouth daily.   losartan (COZAAR) 100 MG tablet Take 1 tablet (100 mg total) by mouth daily.   meclizine (ANTIVERT) 25 MG tablet Take 1 tablet (25 mg total) by mouth 3 (three) times daily as needed for dizziness.   omeprazole (PRILOSEC) 20 MG capsule Take 1 capsule (20 mg total) by mouth daily.   potassium chloride (KLOR-CON M) 10 MEQ tablet Take 1 tablet (10 mEq total) by mouth daily.   Propylene Glycol (SYSTANE BALANCE) 0.6 % SOLN Place 1 drop into both eyes daily as needed (dry eyes).   psyllium (REGULOID) 0.52 g capsule Take 0.52 g by mouth 3 (three)  times a week.   No facility-administered encounter medications on file as of 08/23/2021.    Patient Active Problem List   Diagnosis Date Noted   Dementia ([place]) 05/13/2021   Allergic rhinitis 09/28/2007   Osteoarthritis 09/28/2007   HYPERLIPIDEMIA, MIXED 10/15/2006   Essential hypertension 10/15/2006   GERD 10/15/2006    Conditions to be addressed/monitored: Dementia and Osteoarthritis; Caregiver  Stress, Level of Care Concerns  Care Plan : LCSW Plan of Care  Updates made by Rebekah Chesterfield, LCSW since 08/23/2021 12:00 AM     Problem: Quality of Life (General Plan of Care)      Long-Range Goal: Quality of Life Maintained Completed 08/23/2021  Start Date: 03/07/2021  Expected End Date: 10/10/2021  This Visit's Progress: On track  Recent Progress: On track  Priority: High  Note:   Current Barriers:   Patient unable to consistently perform activities of daily living and needs assistance in order to meet this unmet needs Clinical Goals: Patient will work with CCM LCSW to address needs related to management of health conditions to promote well-being  Clinical Interventions: Assessment of needs, barriers , agencies contacted, as well as how stressors are negatively impacting functioning. All hx provided by patient's daughter, [name] Patient has upcoming appt to establishing care with new PCP Family are interested in placement. Patient is on the wait-list for a preferred facility. Will make [place] aware of FL2 needs, when needed. Family are working on applying for [employer] Family is requesting additional support from Care Coordination as a new CCM referral was not completed  Active listening / Reflection utilized  Engineer, petroleum Provided Caregiver stress acknowledged  Verbalization of feelings encouraged  Discussed caregiver resources and support:  Inter-disciplinary care team collaboration (see longitudinal plan of care) Patient Goals/Self-Care Activities: Over the next 120 days Attend scheduled appointments with providers Utilize healthy coping skills and/or supportive resources provided Contact PCP office with any questions or concerns       Follow Up Plan: Follow up scheduled with [place], MSW, [facility] [email or web address] Phone [phone] 2:18 PM

## 2021-08-23 NOTE — Patient Instructions (Signed)
Visit Information  Thank you for taking time to visit with me today. Please don't hesitate to contact me if I can be of assistance to you before our next scheduled telephone appointment.  Following are the goals we discussed today:  Patient Goals/Self-Care Activities: Over the next 120 days Attend scheduled appointments with providers Utilize healthy coping skills and/or supportive resources provided Contact PCP office with any questions or concerns  Follow will be provided through Care Coordination services  If you are experiencing a Mental Health or [place] or need someone to talk to, please call the Suicide and Crisis Lifeline: 988 call 911   Patient verbalizes understanding of instructions and care plan provided today and agrees to view in [place]. Active MyChart status and patient understanding of how to access instructions and care plan via MyChart confirmed with patient.     [name], MSW, [facility] [email or web address] Phone [phone] 2:19 PM

## 2021-09-06 ENCOUNTER — Encounter: Payer: HMO | Admitting: Family Medicine

## 2021-09-13 ENCOUNTER — Encounter: Payer: Self-pay | Admitting: Family Medicine

## 2021-09-13 ENCOUNTER — Ambulatory Visit (INDEPENDENT_AMBULATORY_CARE_PROVIDER_SITE_OTHER): Payer: PPO | Admitting: Family Medicine

## 2021-09-13 VITALS — BP 146/80 | HR 85 | Temp 98.1°F | Ht 61.0 in | Wt 116.0 lb

## 2021-09-13 DIAGNOSIS — I10 Essential (primary) hypertension: Secondary | ICD-10-CM | POA: Diagnosis not present

## 2021-09-13 MED ORDER — HYDROCHLOROTHIAZIDE 12.5 MG PO CAPS: 12.5000 mg | ORAL_CAPSULE | Freq: Every day | ORAL | 1 refills | Status: DC

## 2021-09-13 NOTE — Patient Instructions (Addendum)
Start taking the hydrochlorothiazide 12.5 mg every day in the morning  OK to switch her losartan to bedtime instead of in the morning.   Check blood pressure every day, please send me her readings in the next 2-3 weeks so I can review.

## 2021-09-13 NOTE — Assessment & Plan Note (Signed)
Currently uncontrolled. I recommended that the patient take 100 mg losartan in the evening and 12.5 mg of HCTZ in the morning every day ( pt was only receiving the HCTZ as needed). Splitting up the medications might help reduce the risk of orthostatic hypotension which the patient had been complaining about previously. Patient's daughter was instructed to send me her BP readings in about 3 weeks so we can adjust her BP medication further if needed

## 2021-09-13 NOTE — Progress Notes (Signed)
Established Patient Office Visit  Subjective   Patient ID: Rhonda Weaver, female    DOB: 1930/11/10  Age: 88 y.o. MRN: [id number]  Chief Complaint  Patient presents with   Establish Care    Patient is here for HTN follow up Had been having issues with low BP initially in April 2023 and her BP meds were adjusted, now her BP is elevated consistently at home. Is currently taking 100 mg losartan daily and she has been using the HCTZ 12.5  mg as needed only for elevated pressure greater than 160.     Patient Active Problem List   Diagnosis Date Noted   Dementia ([place]) 05/13/2021   Allergic rhinitis 09/28/2007   Osteoarthritis 09/28/2007   HYPERLIPIDEMIA, MIXED 10/15/2006   Essential hypertension 10/15/2006   GERD 10/15/2006   Family History  Problem Relation Age of Onset   Heart disease Mother    Kidney disease Father       Review of Systems  All other systems reviewed and are negative.     Objective:     BP (!) 146/80 Comment: repeated by Mykal--jaf  Pulse 85   Temp 98.1 F (36.7 C) (Oral)   Ht '5\' 1"'$  (1.549 m)   Wt 116 lb (52.6 kg)   SpO2 96%   BMI 21.92 kg/m  BP Readings from Last 3 Encounters:  09/13/21 (!) 146/80  06/14/21 136/60  05/13/21 140/80      Physical Exam Vitals reviewed.  Constitutional:      Appearance: Normal appearance. She is normal weight.  Cardiovascular:     Rate and Rhythm: Normal rate and regular rhythm.     Heart sounds: Murmur (2/6 SEM heard over the RLSB) heard.  Pulmonary:     Effort: Pulmonary effort is normal.     Breath sounds: Normal breath sounds and air entry. No rhonchi or rales.  Neurological:     General: No focal deficit present.     Mental Status: She is alert. Mental status is at baseline.      No results found for any visits on 09/13/21.  Last metabolic panel Lab Results  Component Value Date   GLUCOSE 128 (H) 05/07/2021   NA 136 05/07/2021   K 4.0 05/07/2021   CL 97 (L) 05/07/2021   CO2 31  05/07/2021   BUN 15 05/07/2021   CREATININE 0.91 05/07/2021   GFRNONAA >60 05/07/2021   CALCIUM 9.6 05/07/2021   PROT 7.6 01/23/2021   ALBUMIN 4.1 01/23/2021   BILITOT 0.8 01/23/2021   ALKPHOS 52 01/23/2021   AST 23 01/23/2021   ALT 16 01/23/2021   ANIONGAP 8 05/07/2021      The ASCVD Risk score (Arnett DK, et al., 2019) failed to calculate for the following reasons:   The 2019 ASCVD risk score is only valid for ages 38 to 40    Assessment & Plan:   Problem List Items Addressed This Visit       Cardiovascular and Mediastinum   Essential hypertension - Primary    Currently uncontrolled. I recommended that the patient take 100 mg losartan in the evening and 12.5 mg of HCTZ in the morning every day ( pt was only receiving the HCTZ as needed). Splitting up the medications might help reduce the risk of orthostatic hypotension which the patient had been complaining about previously. Patient's daughter was instructed to send me her BP readings in about 3 weeks so we can adjust her BP medication further if needed  Relevant Medications   hydrochlorothiazide (MICROZIDE) 12.5 MG capsule    Return in about 3 months (around 12/14/2021) for HTN follow up.    Farrel Conners, MD

## 2021-09-23 ENCOUNTER — Encounter: Payer: Self-pay | Admitting: Family Medicine

## 2021-09-30 ENCOUNTER — Telehealth: Payer: Self-pay | Admitting: Family Medicine

## 2021-09-30 DIAGNOSIS — F03918 Unspecified dementia, unspecified severity, with other behavioral disturbance: Secondary | ICD-10-CM | POA: Diagnosis not present

## 2021-09-30 DIAGNOSIS — R011 Cardiac murmur, unspecified: Secondary | ICD-10-CM | POA: Diagnosis not present

## 2021-09-30 DIAGNOSIS — I499 Cardiac arrhythmia, unspecified: Secondary | ICD-10-CM | POA: Diagnosis not present

## 2021-09-30 DIAGNOSIS — K219 Gastro-esophageal reflux disease without esophagitis: Secondary | ICD-10-CM | POA: Diagnosis not present

## 2021-09-30 DIAGNOSIS — I8393 Asymptomatic varicose veins of bilateral lower extremities: Secondary | ICD-10-CM | POA: Diagnosis not present

## 2021-09-30 DIAGNOSIS — I10 Essential (primary) hypertension: Secondary | ICD-10-CM | POA: Diagnosis not present

## 2021-09-30 DIAGNOSIS — K5900 Constipation, unspecified: Secondary | ICD-10-CM | POA: Diagnosis not present

## 2021-09-30 DIAGNOSIS — E041 Nontoxic single thyroid nodule: Secondary | ICD-10-CM | POA: Diagnosis not present

## 2021-09-30 DIAGNOSIS — H9193 Unspecified hearing loss, bilateral: Secondary | ICD-10-CM | POA: Diagnosis not present

## 2021-09-30 DIAGNOSIS — E785 Hyperlipidemia, unspecified: Secondary | ICD-10-CM | POA: Diagnosis not present

## 2021-09-30 DIAGNOSIS — I6523 Occlusion and stenosis of bilateral carotid arteries: Secondary | ICD-10-CM | POA: Diagnosis not present

## 2021-09-30 DIAGNOSIS — E876 Hypokalemia: Secondary | ICD-10-CM | POA: Diagnosis not present

## 2021-09-30 NOTE — Telephone Encounter (Signed)
Yes just yo see her and listen to her heart

## 2021-09-30 NOTE — Telephone Encounter (Signed)
Can she be scheduled in the office for an EKG?

## 2021-09-30 NOTE — Telephone Encounter (Signed)
Spoke with the patient's daughter and informed her an appt was advised as below.  Mrs Unk Lightning stated the patient is stable and an appt was scheduled on 8/23 with Dr Elease Hashimoto as PCP does not have any openings.  Left a detailed message on [facility] voicemail with this information also.

## 2021-09-30 NOTE — Telephone Encounter (Signed)
Message sent to PCP as EKG's cannot be scheduled as a nurse's visit. Patient can be scheduled for an in office visit with provider if requested.

## 2021-09-30 NOTE — Telephone Encounter (Signed)
Saw Ms Rhonda Weaver and she had an irregularly irregular heart beat. Heatrate 68. New finding

## 2021-10-01 ENCOUNTER — Telehealth: Payer: Self-pay | Admitting: *Deleted

## 2021-10-02 ENCOUNTER — Other Ambulatory Visit: Payer: Self-pay | Admitting: *Deleted

## 2021-10-02 ENCOUNTER — Ambulatory Visit (INDEPENDENT_AMBULATORY_CARE_PROVIDER_SITE_OTHER): Payer: PPO | Admitting: Family Medicine

## 2021-10-02 ENCOUNTER — Telehealth: Payer: Self-pay | Admitting: Family Medicine

## 2021-10-02 VITALS — BP 142/62 | HR 84 | Temp 97.9°F | Wt 117.0 lb

## 2021-10-02 DIAGNOSIS — I499 Cardiac arrhythmia, unspecified: Secondary | ICD-10-CM

## 2021-10-02 DIAGNOSIS — I491 Atrial premature depolarization: Secondary | ICD-10-CM

## 2021-10-02 DIAGNOSIS — E782 Mixed hyperlipidemia: Secondary | ICD-10-CM

## 2021-10-02 DIAGNOSIS — I340 Nonrheumatic mitral (valve) insufficiency: Secondary | ICD-10-CM

## 2021-10-02 MED ORDER — ATORVASTATIN CALCIUM 20 MG PO TABS
20.0000 mg | ORAL_TABLET | Freq: Every day | ORAL | 1 refills | Status: AC
Start: 2021-10-02 — End: ?

## 2021-10-02 NOTE — Telephone Encounter (Signed)
Pt's daughter - [name] called to say she checked Dr. Berenice Bouton previous medical notes and did not find where Pt was taken off of Atorvastatin.   Please call if you need further information [phone]

## 2021-10-02 NOTE — Progress Notes (Signed)
Established Patient Office Visit  Subjective   Patient ID: Rhonda Weaver, female    DOB: 29-Jul-1930  Age: 88 y.o. MRN: [id number]  Chief Complaint  Patient presents with   Irregular Heart Beat    Nurse came to house & noted new finding of irregular heartbeat; pt does not verb symptoms    HPI   Rhonda Weaver is an 88 year old female who has history of hypertension, GERD, dementia, hyperlipidemia.  She is accompanied by her daughter today.  Apparently, a nurse from her insurance company went out and examine her and noted that she had a "irregular heartbeat "and also apparently auscultated possible carotid bruit and informed them that they need to have this "checked out ".  Patient has advanced dementia so history unreliable.  Daughter relates that she has no known history of atrial fibrillation.  No history of TIA or CVA.  No recent concerning TIA type symptoms.  Her hypertension was treated with losartan 100 mg daily and Microzide 12.5 mg daily.  Apparently was on amlodipine at 1 time but had hypotension and orthostasis with standing.  Daughter brings in log of readings and these are quite variable.  Does have some readings as high as 542 systolic but mostly 706C and 140s.  She had echocardiogram back in January with ejection fraction 55 to 60%.  Severe concentric left ventricular hypertrophy.  Grade 2 diastolic dysfunction.  Mild to moderate mitral valve regurgitation with no evidence for major structural mitral valve problem.  Moderate calcification of aortic valve with mild aortic valve regurgitation.  Past Medical History:  Diagnosis Date   Allergy    Cataract    Chronic bronchitis ([place]) 10/15/2006   Qualifier: Diagnosis of  By: Lenna Gilford MD, Deborra Medina    COLONIC POLYPS, ADENOMATOUS 10/15/2006   Qualifier: Diagnosis of  By: Lenna Gilford MD, Deborra Medina    DEGENERATIVE JOINT DISEASE 09/28/2007   Qualifier: Diagnosis of  By: Lenna Gilford MD, Deborra Medina    DIVERTICULOSIS OF COLON 09/28/2007   Qualifier: Diagnosis of   By: Lenna Gilford MD, Deborra Medina    GERD (gastroesophageal reflux disease)    HEMORRHOIDS, INTERNAL 10/15/2006   Qualifier: Diagnosis of  By: Lenna Gilford MD, Deborra Medina    Hyperlipidemia    Hypertension    Irritable bowel syndrome 10/15/2006   Qualifier: Diagnosis of  By: Lenna Gilford MD, Deborra Medina    Past Surgical History:  Procedure Laterality Date   COLONOSCOPY     DENTAL SURGERY     POLYPECTOMY     VAGINAL HYSTERECTOMY      reports that she has never smoked. She has never used smokeless tobacco. She reports that she does not drink alcohol and does not use drugs. family history includes Heart disease in her mother; Kidney disease in her father. Allergies  Allergen Reactions   Fenofibrate     REACTION: pt states intolerant   Gemfibrozil     REACTION: pt states intolerant   Niacin     REACTION: pt states intolerant   Penicillins     REACTION: Hx of arm swelling' \\T'$ \ itching after shot   Sulfamethoxazole-Trimethoprim     REACTION: "deathly sick"  w/  N\T\V   Sulfonamide Derivatives     REACTION: nausea and vomiting    ROS  Not reliably obtained from patient.  She has advanced dementia.   Objective:     BP (!) 142/62 (BP Location: Left Arm, Cuff Size: Normal)   Pulse 84   Temp 97.9 F (36.6 C) (Oral)  Wt 117 lb (53.1 kg)   SpO2 96%   BMI 22.11 kg/m  BP Readings from Last 3 Encounters:  10/02/21 (!) 142/62  09/13/21 (!) 146/80  06/14/21 136/60   Wt Readings from Last 3 Encounters:  10/02/21 117 lb (53.1 kg)  09/13/21 116 lb (52.6 kg)  06/14/21 113 lb 4.8 oz (51.4 kg)      Physical Exam Vitals reviewed.  Constitutional:      Appearance: Normal appearance.  Neck:     Comments: May have faint carotid bruit bilaterally versus radiation from heart murmur Cardiovascular:     Comments: Patient will premature beat.  She has systolic murmur heard best over the mitral valve region. Pulmonary:     Effort: Pulmonary effort is normal.     Breath sounds: Normal breath sounds. No wheezing or  rales.  Neurological:     Mental Status: She is alert.      No results found for any visits on 10/02/21.  Last CBC Lab Results  Component Value Date   WBC 6.7 05/07/2021   HGB 14.1 05/07/2021   HCT 42.2 05/07/2021   MCV 98.1 05/07/2021   MCH 32.8 05/07/2021   RDW 12.6 05/07/2021   PLT 266 70/35/0093   Last metabolic panel Lab Results  Component Value Date   GLUCOSE 128 (H) 05/07/2021   NA 136 05/07/2021   K 4.0 05/07/2021   CL 97 (L) 05/07/2021   CO2 31 05/07/2021   BUN 15 05/07/2021   CREATININE 0.91 05/07/2021   GFRNONAA >60 05/07/2021   CALCIUM 9.6 05/07/2021   PROT 7.6 01/23/2021   ALBUMIN 4.1 01/23/2021   BILITOT 0.8 01/23/2021   ALKPHOS 52 01/23/2021   AST 23 01/23/2021   ALT 16 01/23/2021   ANIONGAP 8 05/07/2021   Last lipids Lab Results  Component Value Date   CHOL 176 05/01/2021   HDL 44.00 05/01/2021   LDLCALC 92 05/01/2021   LDLDIRECT 145.0 01/23/2021   TRIG 200.0 (H) 05/01/2021   CHOLHDL 4 05/01/2021   Last thyroid functions Lab Results  Component Value Date   TSH 1.37 03/18/2021      The ASCVD Risk score (Arnett DK, et al., 2019) failed to calculate for the following reasons:   The 2019 ASCVD risk score is only valid for ages 36 to 11    Assessment & Plan:   #1 irregular heart rhythm.  EKG shows sinus rhythm with occasional PACs.  No evidence for atrial fibrillation. -Reassurance and follow-up for now.  Limit caffeine intake.  #2 possible carotid bruits vs versus radiation from mitral regurgitation.   We recommended NOT screening for carotid stenosis in this 88 year old with dementia who is asymptomatic---consistent with national guidelines  #3 hypertension.  Repeat reading 818 systolic.  Continue current medications.  Suggested they check occasional standing blood pressures to make sure she is not still has having orthostasis.  If systolic climbing consider starting back lower dose amlodipine at 2.5 mg   No follow-ups on file.     Carolann Littler, MD

## 2021-10-02 NOTE — Patient Instructions (Signed)
Monitor blood pressure and, if possible, check some standing blood pressures  Be in touch if systolic (top number) consistently > 150

## 2021-10-02 NOTE — Telephone Encounter (Signed)
Error

## 2021-10-03 NOTE — Telephone Encounter (Signed)
Spoke with the patient's daughter for clarification.  She stated she was told the Rx was not on the patient's medication list and was on hold yesterday?  I advised Mrs Unk Lightning refills were sent to the pharmacy by Dr Legrand Como on 8/23 and she agreed.

## 2021-10-07 ENCOUNTER — Ambulatory Visit: Payer: Self-pay | Admitting: Licensed Clinical Social Worker

## 2021-10-07 NOTE — Patient Instructions (Signed)
Visit Information  Thank you for taking time to visit with me today. Please don't hesitate to contact me if I can be of assistance to you.   Following are the goals we discussed today:   Goals Addressed             This Visit's Progress    Manage Caregiver Stress   On track    Care Coordination Interventions: Active listening / Reflection utilized  Emotional Support Provided Problem [place] strategies reviewed Caregiver stress acknowledged  Verbalization of feelings encouraged  Pt's daughter provided FL2 to [facility] for placement with Assisted Living. LCSW strongly encouraged family to follow up on availability noting FL2 will expire in 30 days. Family has contacted other facilities, as well Caregiver identified patterns to when pt's behavior becomes restless and/or difficult Strategies to assist with irritability discussed     Patient Stated   On track    Walk more and socialize with nice people.         Our next appointment is by telephone on 11/04/21 at 10 AM  Please call the care guide team at [phone] if you need to cancel or reschedule your appointment.   If you are experiencing a Mental Health or [place] or need someone to talk to, please call the Suicide and Crisis Lifeline: 988 call 911   Patient verbalizes understanding of instructions and care plan provided today and agrees to view in [place]. Active MyChart status and patient understanding of how to access instructions and care plan via MyChart confirmed with patient.     [name], MSW, [place].[email or web address] Phone [phone] 1:51 PM

## 2021-10-07 NOTE — Patient Outreach (Signed)
  Care Coordination   Follow Up Visit Note   10/07/2021 Name: JEANETTA ALONZO MRN: [id number] DOB: 1930/06/22  MARNETTE PERKINS is a 88 y.o. year old female who sees Farrel Conners, MD for primary care. I spoke with  Judithann Graves Steuck's daughter, [name], by phone today.  What matters to the patients health and wellness today?  Placement/Caregiver Stress    Goals Addressed             This Visit's Progress    Manage Caregiver Stress   On track    Care Coordination Interventions: Active listening / Reflection utilized  Emotional Support Provided Problem [place] strategies reviewed Caregiver stress acknowledged  Verbalization of feelings encouraged  Pt's daughter provided FL2 to [facility] for placement with Assisted Living. LCSW strongly encouraged family to follow up on availability noting FL2 will expire in 30 days. Family has contacted other facilities, as well Caregiver identified patterns to when pt's behavior becomes restless and/or difficult Strategies to assist with irritability discussed     Patient Stated   On track    Walk more and socialize with nice people.         SDOH assessments and interventions completed:  No     Care Coordination Interventions Activated:  Yes  Care Coordination Interventions:  Yes, provided   Follow up plan: Follow up call scheduled for 11/04/21    Encounter Outcome:  Pt. Visit Completed   [name], MSW, [place].[email or web address] Phone [phone] 1:50 PM

## 2021-10-16 ENCOUNTER — Encounter: Payer: Self-pay | Admitting: Family Medicine

## 2021-10-16 DIAGNOSIS — I10 Essential (primary) hypertension: Secondary | ICD-10-CM

## 2021-10-17 DIAGNOSIS — Z111 Encounter for screening for respiratory tuberculosis: Secondary | ICD-10-CM | POA: Diagnosis not present

## 2021-10-17 DIAGNOSIS — Z23 Encounter for immunization: Secondary | ICD-10-CM | POA: Diagnosis not present

## 2021-10-17 MED ORDER — HYDROCHLOROTHIAZIDE 25 MG PO TABS: 25.0000 mg | ORAL_TABLET | Freq: Every day | ORAL | 3 refills | Status: AC

## 2021-10-20 DIAGNOSIS — Z681 Body mass index (BMI) 19 or less, adult: Secondary | ICD-10-CM | POA: Diagnosis not present

## 2021-10-20 DIAGNOSIS — Z111 Encounter for screening for respiratory tuberculosis: Secondary | ICD-10-CM | POA: Diagnosis not present

## 2021-10-28 DIAGNOSIS — G301 Alzheimer's disease with late onset: Secondary | ICD-10-CM | POA: Diagnosis not present

## 2021-10-28 DIAGNOSIS — E785 Hyperlipidemia, unspecified: Secondary | ICD-10-CM | POA: Diagnosis not present

## 2021-10-28 DIAGNOSIS — F32A Depression, unspecified: Secondary | ICD-10-CM | POA: Diagnosis not present

## 2021-10-28 DIAGNOSIS — K219 Gastro-esophageal reflux disease without esophagitis: Secondary | ICD-10-CM | POA: Diagnosis not present

## 2021-10-28 DIAGNOSIS — I10 Essential (primary) hypertension: Secondary | ICD-10-CM | POA: Diagnosis not present

## 2021-11-04 ENCOUNTER — Ambulatory Visit: Payer: Self-pay | Admitting: Licensed Clinical Social Worker

## 2021-11-04 DIAGNOSIS — I10 Essential (primary) hypertension: Secondary | ICD-10-CM | POA: Diagnosis not present

## 2021-11-04 DIAGNOSIS — G301 Alzheimer's disease with late onset: Secondary | ICD-10-CM | POA: Diagnosis not present

## 2021-11-04 DIAGNOSIS — I479 Paroxysmal tachycardia, unspecified: Secondary | ICD-10-CM | POA: Diagnosis not present

## 2021-11-06 DIAGNOSIS — E785 Hyperlipidemia, unspecified: Secondary | ICD-10-CM | POA: Diagnosis not present

## 2021-11-06 DIAGNOSIS — I10 Essential (primary) hypertension: Secondary | ICD-10-CM | POA: Diagnosis not present

## 2021-11-06 DIAGNOSIS — Z79899 Other long term (current) drug therapy: Secondary | ICD-10-CM | POA: Diagnosis not present

## 2021-11-06 DIAGNOSIS — G301 Alzheimer's disease with late onset: Secondary | ICD-10-CM | POA: Diagnosis not present

## 2021-11-11 DIAGNOSIS — G301 Alzheimer's disease with late onset: Secondary | ICD-10-CM | POA: Diagnosis not present

## 2021-11-11 DIAGNOSIS — E785 Hyperlipidemia, unspecified: Secondary | ICD-10-CM | POA: Diagnosis not present

## 2021-11-11 DIAGNOSIS — I10 Essential (primary) hypertension: Secondary | ICD-10-CM | POA: Diagnosis not present

## 2021-11-11 NOTE — Patient Instructions (Signed)
Visit Information  Thank you for taking time to visit with me today. Please don't hesitate to contact me if I can be of assistance to you.   Following are the goals we discussed today:   Goals Addressed             This Visit's Progress    COMPLETED: Manage Caregiver Stress       Care Coordination Interventions: Mindfulness or Relaxation training provided Active listening / Reflection utilized  Emotional Support Provided Caregiver stress acknowledged  Verbalization of feelings encouraged  Pt has been placed at [facility] and is acclimating well Pt plans on visiting with in-house provider. Family will contact PCP office, should she establish care with new PCP at facility. Pt obtained Flu shot Sept 7, in addition, to TB test LCSW reviewed upcoming appts         If you are experiencing a Mental Health or [place] or need someone to talk to, please call the Suicide and Crisis Lifeline: 988 call 911   Patient verbalizes understanding of instructions and care plan provided today and agrees to view in [place]. Active MyChart status and patient understanding of how to access instructions and care plan via MyChart confirmed with patient.     No further follow up required:    [name], MSW, [email or web address] Phone [phone] 5:20 AM

## 2021-11-11 NOTE — Patient Outreach (Signed)
  Care Coordination   Follow Up Visit Note   11/11/2021 Name: AERIN DELANY MRN: [id number] DOB: 1930/09/09  REIS PIENTA is a 88 y.o. year old female who sees Farrel Conners, MD for primary care. I spoke with  [name] by phone today.  What matters to the patients health and wellness today?  Self-care    Goals Addressed             This Visit's Progress    COMPLETED: Manage Caregiver Stress       Care Coordination Interventions: Mindfulness or Relaxation training provided Active listening / Reflection utilized  Emotional Support Provided Caregiver stress acknowledged  Verbalization of feelings encouraged  Pt has been placed at [facility] and is acclimating well Pt plans on visiting with in-house provider. Family will contact PCP office, should she establish care with new PCP at facility. Pt obtained Flu shot Sept 7, in addition, to TB test LCSW reviewed upcoming appts         SDOH assessments and interventions completed:  Yes  SDOH Interventions Today    Flowsheet Row Most Recent Value  SDOH Interventions   Housing Interventions Intervention Not Indicated  Transportation Interventions Intervention Not Indicated        Care Coordination Interventions Activated:  Yes  Care Coordination Interventions:  Yes, provided   Follow up plan: No further intervention required.   Encounter Outcome:  Pt. Visit Completed   [name], MSW, [email or web address] Phone [phone] 5:20 AM

## 2021-11-18 ENCOUNTER — Telehealth: Payer: Self-pay | Admitting: Pharmacist

## 2021-11-18 NOTE — Chronic Care Management (AMB) (Signed)
    Chronic Care Management Pharmacy Assistant   Name: Rhonda Weaver  MRN: [id number] DOB: 07-22-1930  Reason for Encounter: 11/19/2021 Appointment reminder.  Spoke with patients daughter [name], patient is now a resident at [facility] and will no longer need our services. Her daughter was very thankful for everything we have done for her mother.   [place] Pharmacist Assistant [phone]

## 2021-11-19 ENCOUNTER — Telehealth: Payer: HMO

## 2021-11-19 DIAGNOSIS — I10 Essential (primary) hypertension: Secondary | ICD-10-CM | POA: Diagnosis not present

## 2021-11-19 DIAGNOSIS — G4701 Insomnia due to medical condition: Secondary | ICD-10-CM | POA: Diagnosis not present

## 2021-11-19 DIAGNOSIS — F0390 Unspecified dementia without behavioral disturbance: Secondary | ICD-10-CM | POA: Diagnosis not present

## 2021-11-22 ENCOUNTER — Ambulatory Visit: Payer: PPO | Admitting: Physician Assistant

## 2021-11-22 ENCOUNTER — Encounter: Payer: Self-pay | Admitting: Physician Assistant

## 2021-11-22 VITALS — BP 127/70 | HR 79 | Resp 20 | Ht 61.0 in | Wt 119.0 lb

## 2021-11-22 DIAGNOSIS — F0150 Vascular dementia without behavioral disturbance: Secondary | ICD-10-CM | POA: Diagnosis not present

## 2021-11-22 NOTE — Progress Notes (Signed)
Assessment/Plan:   Mixed Alzheimer's and vascular dementia  Rhonda Weaver is a very pleasant 88 y.o. RH female with  a history of hypertension, hyperlipidemia, anxiety, history of thyroid nodules and lung nodules (being worked up by PCP), depression, history of mixed Alzheimer's and vascular dementia  seen today in follow up for memory loss. Patient is currently on donepezil 10 mg daily.  She is also on Lexapro 5 mg daily for mood, tolerating well.  Prior MRI brain personally reviewed was remarkable for chronic small vessel ischemia and generalized volume loss without clear local predilection.  Since her last visit, the patient has moved to an ALF, and she is adjusting very well to her living situation.   Continue 24/7 care for safety at ALF  Monitor mood by PCP, she is on Lexapro 5 mg daily Continue donepezil 10 mg daily Follow-up in 6 months    Subjective:    This patient is accompanied in the office by her daughter who supplements the history.  Previous records as well as any outside records available were reviewed prior to todays visit.  She was last seen on 05/23/2021, at which time her MMSE was 10/30     Any changes in memory since last visit? Short-term memory is about the same according to daughter.,  She may forget recent conversations, some words, but overall she is doing well.  LTM is stable  repeats oneself?  Endorsed, to her daughter  Disoriented when walking into a room?  Denied  Do some activities of daily living such as cleaning and making beds.  Some hoarding behavior was reported with moon pies . Leaving objects in unusual places?   She places cups of water in the refrigerator   Ambulates  with difficulty?   Patient denies   Recent falls?  Patient denies   Any head injuries?  Patient denies   History of seizures?   Patient denies   Wandering behavior?  Patient denies   Patient drives?   Patient no longer drives  Any mood changes since last visit?  History of  depression, takes Lexapro and helps  Hallucinations?  Patient denies   Paranoia?  Patient denies   Patient reports that sleeps well without vivid dreams, REM behavior or sleepwalking   History of sleep apnea?  Patient denies   Any hygiene concerns?  As before, the patient refuses to shower, she has to be reminded of it. Independent of bathing and dressing?  Endorsed  Does the patient needs help with medications?  Denies Who is in charge of the finances?   is in charge    Any changes in appetite?  She continues to enjoy eating "a lot of bread". "She put on a few pounds " Patient have trouble swallowing? Patient denies   Does the patient cook?  Patient denies   Any headaches?  Patient denies   Double vision? Patient denies   Any focal numbness or tingling?  Patient denies   Chronic back pain Patient denies   Unilateral weakness?  Patient denies   Any tremors?  Patient denies   Any history of anosmia?  Patient denies   Any incontinence of urine? Endorsed, wears pads  Any bowel dysfunction?   Patient denies      Patient lives with: [facility] for the last 4 weeks "they like her there" She has made new friends    Initial visit 03/18/21 The patient is seen in neurologic consultation at the request of Koberlein, Junell C,  MD for the evaluation of memory.  The patient is accompanied by her daughter who supplements the history. This is a 88 y.o. year old RH  female who has had memory issues for about several years but after her recent fall in December 2022 "things changed".  In review, she sustained a mechanical fall requiring evaluation at the ED.  Since then, she was brought to her daughter's home, who noted significant cognitive declining her mother.  Until that point, they had seen each other often, but living with her brought "everything out ".  It is unclear how many years she could have had the symptoms.  She then began to notice that her mother was repeating herself, was disoriented when  walking into her room, and when confronted, she was in denial of any cognitive deficiencies.  She also noticed that about her medications, her only "go-to" meds were Dramamine and Pepto-Bismol.  After December, she was placed on donepezil 5 mg daily.  She denies leaving objects in unusual places, but cannot find things that she left behind.  She denies any head injuries, when she fell she reduced her back.  She no longer drives.  Her mood is worse in the evening, with intermittent periods of agitation.  She has some depression since her friend [employer], and at times, she hallucinates and sees him.  She denies any paranoia.  She sleeps well, but she has vivid dreams, denies sleepwalking or REM behavior.  There are significant hygiene concerns, she refuses to take a shower, she does not like to change her clothes.  Her daughter is in charge of the finances.  Her appetite is good, denies trouble swallowing.  She does not cook.  She denies any headaches, double vision, dizziness, focal numbness or tingling, unilateral weakness, tremors or anosmia. No history of seizures. Denies urine incontinence, retention, constipation or diarrhea.  Denies OSA, ETOH or Tobacco. Family History remarkable for father with Alzheimer's disease.  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 PREVIOUS MEDICATIONS:   CURRENT MEDICATIONS:  Outpatient Encounter Medications as of 11/22/2021  Medication Sig   acetaminophen (TYLENOL) 500 MG tablet Take 500 mg by mouth every 6 (six) hours as needed for mild pain or headache.   atorvastatin (LIPITOR) 20 MG tablet Take 1 tablet (20 mg total) by mouth daily.   donepezil (ARICEPT) 10 MG tablet Take 1 tablet (10  mg total) by mouth daily.   Ensure (ENSURE) Take 237 mLs by mouth 2 (two) times daily between meals.   escitalopram (LEXAPRO) 5 MG tablet Take 1 tablet (5 mg total) by mouth daily.   famotidine (PEPCID) 20 MG tablet Take 1 tablet by mouth twice daily   fluticasone (FLONASE) 50 MCG/ACT nasal spray USE TWO SPRAY(S) IN EACH NOSTRIL ONCE DAILY (Patient taking differently: Place 2 sprays into both nostrils daily as needed for allergies.)   fluticasone (FLOVENT HFA) 44 MCG/ACT inhaler Inhale 2 puffs into the lungs as needed. (Patient taking differently: Inhale 2 puffs into the lungs daily as needed (shortness of breath).)   hydrochlorothiazide (HYDRODIURIL) 25 MG tablet Take 1 tablet (25 mg total) by mouth daily.   losartan (COZAAR) 100 MG tablet Take 1 tablet (100 mg total) by mouth daily.  potassium chloride (KLOR-CON M) 10 MEQ tablet Take 1 tablet (10 mEq total) by mouth daily.   Propylene Glycol (SYSTANE BALANCE) 0.6 % SOLN Place 1 drop into both eyes daily as needed (dry eyes).   psyllium (REGULOID) 0.52 g capsule Take 0.52 g by mouth 3 (three) times a week.   No facility-administered encounter medications on file as of 11/22/2021.       03/19/2021    8:00 AM 04/07/2017    2:32 PM  MMSE - Mini Mental State Exam  Orientation to time 2 1  Orientation to Place 5 5  Registration 3 3  Attention/ Calculation 1 1  Recall 0 0  Language- name 2 objects 2 2  Language- repeat 0 1  Language- follow 3 step command 3 3  Language- read & follow direction 1 1  Write a sentence 1 1  Copy design 0 1  Total score 18 19      03/17/2021    7:42 AM  Montreal Cognitive Assessment   Visuospatial/ Executive (0/5) 1  Naming (0/3) 3  Attention: Read list of digits (0/2) 1  Attention: Read list of letters (0/1) 0  Attention: Serial 7 subtraction starting at 100 (0/3) 0  Language: Repeat phrase (0/2) 2  Language : Fluency (0/1) 0  Abstraction (0/2) 0  Delayed Recall (0/5) 0  Orientation (0/6) 2  Total 9   Adjusted Score (based on education) 10    Objective:     PHYSICAL EXAMINATION:    VITALS:   Vitals:   11/22/21 1435  BP: 127/70  Pulse: 79  Resp: 20  SpO2: 99%  Weight: 119 lb (54 kg)  Height: '5\' 1"'$  (1.549 m)    GEN:  The patient appears stated age and is in NAD. HEENT:  Normocephalic, atraumatic.   Neurological examination:  General: NAD, well-groomed, appears stated age. Orientation: The patient is alert. Oriented to person, place and date Cranial nerves: There is good facial symmetry.The speech is fluent and clear. No aphasia or dysarthria. Fund of knowledge is appropriate. Recent and remote memory are impaired. Attention and concentration are reduced.  Able to name objects and repeat phrases.  Hearing is intact to conversational tone.    Sensation: Sensation is intact to light touch throughout Motor: Strength is at least antigravity x4. Tremors: none  DTR's 2/4 in UE/LE     Movement examination: Tone: There is normal tone in the UE/LE Abnormal movements:  no tremor.  No myoclonus.  No asterixis.   Coordination:  There is no decremation with RAM's. Normal finger to nose  Gait and Station: The patient has no difficulty arising out of a deep-seated chair without the use of the hands. The patient's stride length is good.  Gait is cautious and narrow.    Thank you for allowing [place] the opportunity to participate in the care of this nice patient. Please do not hesitate to contact us for any questions or concerns.   Total time spent on today's visit was 30 minutes dedicated to this patient today, preparing to see patient, examining the patient, ordering tests and/or medications and counseling the patient, documenting clinical information in the EHR or other health record, independently interpreting results and communicating results to the patient/family, discussing treatment and goals, answering patient's questions and coordinating care.  Cc:  Farrel Conners, MD  Sharene Butters 11/22/2021 3:42 PM

## 2021-11-22 NOTE — Patient Instructions (Signed)
It was a pleasure to see you today at our office.   Recommendations:  Follow up in 6  months Continue donepezil 10 mg daily. Side effects were discussed  Continue Lexapro 5 mg daily   Whom to call:  Memory  decline, memory medications: Call our office [phone]   For psychiatric meds, mood meds: Please have your primary care physician manage these medications.   Counseling regarding caregiver distress, including caregiver depression, anxiety and issues regarding community resources, adult day care programs, adult living facilities, or memory care questions:   Feel free to contact Marcus, Social Worker at [phone]   For assessment of decision of mental capacity and competency:  Call Dr. Anthoney Harada, geriatric psychiatrist at [phone]  For guidance in geriatric dementia issues please call [facility] [phone]  For guidance regarding [facility] and if placement were needed at the facility, contact Arnell Asal, Social Worker tel: [phone]  If you have any severe symptoms of a stroke, or other severe issues such as confusion,severe chills or fever, etc call 911 or go to the ER as you may need to be evaluated further   Feel free to visit Facebook page " [email or web address]" for tips of how to care for people with memory problems.    RECOMMENDATIONS FOR ALL PATIENTS WITH MEMORY PROBLEMS: 1. Continue to exercise (Recommend 30 minutes of walking everyday, or 3 hours every week) 2. Increase social interactions - continue going to [place] and enjoy social gatherings with friends and family 3. Eat healthy, avoid fried foods and eat more fruits and vegetables 4. Maintain adequate blood pressure, blood sugar, and blood cholesterol level. Reducing the risk of stroke and cardiovascular disease also helps promoting better memory. 5. Avoid stressful situations. Live a simple life and avoid aggravations. Organize your time and prepare for the  next day in anticipation. 6. Sleep well, avoid any interruptions of sleep and avoid any distractions in the bedroom that may interfere with adequate sleep quality 7. Avoid sugar, avoid sweets as there is a strong link between excessive sugar intake, diabetes, and cognitive impairment We discussed the Mediterranean diet, which has been shown to help patients reduce the risk of progressive memory disorders and reduces cardiovascular risk. This includes eating fish, eat fruits and green leafy vegetables, nuts like almonds and hazelnuts, walnuts, and also use olive oil. Avoid fast foods and fried foods as much as possible. Avoid sweets and sugar as sugar use has been linked to worsening of memory function.  There is always a concern of gradual progression of memory problems. If this is the case, then we may need to adjust level of care according to patient needs. Support, both to the patient and caregiver, should then be put into place.    The Alzheimer's Association is here all day, every day for people facing Alzheimer's disease through our free 24/7 Helpline: [phone]. The Helpline provides reliable information and support to all those who need assistance, such as individuals living with memory loss, Alzheimer's or other dementia, caregivers, health care professionals and the public.  Our highly trained and knowledgeable staff can help you with: Understanding memory loss, dementia and Alzheimer's  Medications and other treatment options  General information about aging and brain health  Skills to provide quality care and to find the best care from professionals  Legal, financial and living-arrangement decisions Our Helpline also features: Confidential care consultation provided by master's level clinicians who can help with decision-making support, crisis assistance and  education on issues families face every day  Help in a caller's preferred language using our translation service that features  more than 200 languages and dialects  Referrals to local community programs, services and ongoing support     FALL PRECAUTIONS: Be cautious when walking. Scan the area for obstacles that may increase the risk of trips and falls. When getting up in the mornings, sit up at the edge of the bed for a few minutes before getting out of bed. Consider elevating the bed at the head end to avoid drop of blood pressure when getting up. Walk always in a well-lit room (use night lights in the walls). Avoid area rugs or power cords from appliances in the middle of the walkways. Use a walker or a cane if necessary and consider physical therapy for balance exercise. Get your eyesight checked regularly.  FINANCIAL OVERSIGHT: Supervision, especially oversight when making financial decisions or transactions is also recommended.  HOME SAFETY: Consider the safety of the kitchen when operating appliances like stoves, microwave oven, and blender. Consider having supervision and share cooking responsibilities until no longer able to participate in those. Accidents with firearms and other hazards in the house should be identified and addressed as well.   ABILITY TO BE LEFT ALONE: If patient is unable to contact 911 operator, consider using LifeLine, or when the need is there, arrange for someone to stay with patients. Smoking is a fire hazard, consider supervision or cessation. Risk of wandering should be assessed by caregiver and if detected at any point, supervision and safe proof recommendations should be instituted.  MEDICATION SUPERVISION: Inability to self-administer medication needs to be constantly addressed. Implement a mechanism to ensure safe administration of the medications.   DRIVING: Regarding driving, in patients with progressive memory problems, driving will be impaired. We advise to have someone else do the driving if trouble finding directions or if minor accidents are reported. Independent driving  assessment is available to determine safety of driving.   If you are interested in the driving assessment, you can contact the following:  [employer] in [place]  [place] [phone] or [phone]      [place] refers to food and lifestyle choices that are based on the traditions of countries located on the [employer]. This way of eating has been shown to help prevent certain conditions and improve outcomes for people who have chronic diseases, like kidney disease and heart disease. What are tips for following this plan? Lifestyle  Cook and eat meals together with your family, when possible. Drink enough fluid to keep your urine clear or pale yellow. Be physically active every day. This includes: Aerobic exercise like running or swimming. Leisure activities like gardening, walking, or housework. Get 7-8 hours of sleep each night. If recommended by your health care provider, drink red wine in moderation. This means 1 glass a day for nonpregnant women and 2 glasses a day for men. A glass of wine equals 5 oz (150 mL). Reading food labels  Check the serving size of packaged foods. For foods such as rice and pasta, the serving size refers to the amount of cooked product, not dry. Check the total fat in packaged foods. Avoid foods that have saturated fat or trans fats. Check the ingredients list for added sugars, such as corn syrup. Shopping  At the grocery store, buy most of your food from the areas  near the walls of the store. This includes: Fresh fruits and vegetables (produce). Grains, beans, nuts, and seeds. Some of these may be available in unpackaged forms or large amounts (in bulk). Fresh seafood. Poultry and eggs. Low-fat dairy products. Buy whole ingredients instead of prepackaged foods. Buy fresh fruits and  vegetables in-season from local farmers markets. Buy frozen fruits and vegetables in resealable bags. If you do not have access to quality fresh seafood, buy precooked frozen shrimp or canned fish, such as tuna, salmon, or sardines. Buy small amounts of raw or cooked vegetables, salads, or olives from the deli or salad bar at your store. Stock your pantry so you always have certain foods on hand, such as olive oil, canned tuna, canned tomatoes, rice, pasta, and beans. Cooking  Cook foods with extra-virgin olive oil instead of using butter or other vegetable oils. Have meat as a side dish, and have vegetables or grains as your main dish. This means having meat in small portions or adding small amounts of meat to foods like pasta or stew. Use beans or vegetables instead of meat in common dishes like chili or lasagna. Experiment with different cooking methods. Try roasting or broiling vegetables instead of steaming or sauteing them. Add frozen vegetables to soups, stews, pasta, or rice. Add nuts or seeds for added healthy fat at each meal. You can add these to yogurt, salads, or vegetable dishes. Marinate fish or vegetables using olive oil, lemon juice, garlic, and fresh herbs. Meal planning  Plan to eat 1 vegetarian meal one day each week. Try to work up to 2 vegetarian meals, if possible. Eat seafood 2 or more times a week. Have healthy snacks readily available, such as: Vegetable sticks with hummus. Greek yogurt. Fruit and nut trail mix. Eat balanced meals throughout the week. This includes: Fruit: 2-3 servings a day Vegetables: 4-5 servings a day Low-fat dairy: 2 servings a day Fish, poultry, or lean meat: 1 serving a day Beans and legumes: 2 or more servings a week Nuts and seeds: 1-2 servings a day Whole grains: 6-8 servings a day Extra-virgin olive oil: 3-4 servings a day Limit red meat and sweets to only a few servings a month What are my food choices? Mediterranean  diet Recommended Grains: Whole-grain pasta. Brown rice. Bulgar wheat. Polenta. Couscous. Whole-wheat bread. [place]. Vegetables: Artichokes. Beets. Broccoli. Cabbage. Carrots. Eggplant. Green beans. Chard. Kale. Spinach. Onions. Leeks. Peas. Squash. Tomatoes. Peppers. Radishes. Fruits: Apples. Apricots. Avocado. Berries. Bananas. Cherries. Dates. Figs. Grapes. Lemons. Melon. Oranges. Peaches. Plums. Pomegranate. Meats and other protein foods: Beans. Almonds. Sunflower seeds. Pine nuts. Peanuts. [place]. Salmon. Scallops. Shrimp. [place]. Tilapia. Clams. Oysters. Eggs. Dairy: Low-fat milk. Cheese. Greek yogurt. Beverages: Water. Red wine. Herbal tea. Fats and oils: Extra virgin olive oil. Avocado oil. Grape seed oil. Sweets and desserts: [place] yogurt with honey. Baked apples. Poached pears. Trail mix. Seasoning and other foods: Basil. Cilantro. Coriander. Cumin. Mint. Parsley. Sage. Rosemary. Tarragon. Garlic. Oregano. Thyme. Pepper. Balsalmic vinegar. Tahini. Hummus. Tomato sauce. Olives. Mushrooms. Limit these Grains: Prepackaged pasta or rice dishes. Prepackaged cereal with added sugar. Vegetables: Deep fried potatoes (french fries). Fruits: Fruit canned in syrup. Meats and other protein foods: Beef. Pork. Lamb. Poultry with skin. Hot dogs. [place] Salines. Dairy: Ice cream. Sour cream. Whole milk. Beverages: Juice. Sugar-sweetened soft drinks. Beer. Liquor and spirits. Fats and oils: Butter. Canola oil. Vegetable oil. Beef fat (tallow). Lard. Sweets and desserts: Cookies. Cakes. Pies. Candy. Seasoning and other foods: Mayonnaise. Premade sauces and marinades.  The items listed may not be a complete list. Talk with your dietitian about what dietary choices are right for you. Summary The Mediterranean diet includes both food and lifestyle choices. Eat a variety of fresh fruits and vegetables, beans, nuts, seeds, and whole grains. Limit the amount of red meat and sweets that you eat. Talk with your  health care provider about whether it is safe for you to drink red wine in moderation. This means 1 glass a day for nonpregnant women and 2 glasses a day for men. A glass of wine equals 5 oz (150 mL). This information is not intended to replace advice given to you by your health care provider. Make sure you discuss any questions you have with your health care provider. Document Released: 09/20/2015 Document Revised: 10/23/2015 Document Reviewed: 09/20/2015 Elsevier Interactive Patient Education  2017 [employer].

## 2021-12-09 DIAGNOSIS — G4701 Insomnia due to medical condition: Secondary | ICD-10-CM | POA: Diagnosis not present

## 2021-12-09 DIAGNOSIS — G301 Alzheimer's disease with late onset: Secondary | ICD-10-CM | POA: Diagnosis not present

## 2021-12-10 ENCOUNTER — Telehealth: Payer: Self-pay | Admitting: Licensed Clinical Social Worker

## 2021-12-12 ENCOUNTER — Telehealth: Payer: Self-pay | Admitting: Licensed Clinical Social Worker

## 2021-12-12 NOTE — Patient Instructions (Signed)
Visit Information  Thank you for taking time to visit with me today. Please don't hesitate to contact me if I can be of assistance to you.   Following are the goals we discussed today:   Goals Addressed             This Visit's Progress    Assistance with Obtaining FL2   On track    Care Coordination Interventions: Solution-Focused Strategies employed:  Active listening / Reflection utilized  Emotional Support Provided Verbalization of feelings encouraged  LCSW spoke with pt's daughter, Ainsley Spinner shared that she has applied for MA for pt; however, is concerned that pt may have to be placed at different facility due to limited MA beds. Family are currently paying out of pocket, to assist with spending down pt's savings to qualify for MA Pt will discuss with facility about completing FL2 form LCSW encouraged family to contact her with any assistance regarding facilities that may have MA availability            If you are experiencing a Mental Health or [place] or need someone to talk to, please call the Suicide and Crisis Lifeline: 988 call 911   Patient verbalizes understanding of instructions and care plan provided today and agrees to view in [place]. Active MyChart status and patient understanding of how to access instructions and care plan via MyChart confirmed with patient.     [name], MSW, [email or web address] Phone [phone] 8:57 PM

## 2021-12-12 NOTE — Patient Instructions (Signed)
Visit Information  Thank you for taking time to visit with me today. Please don't hesitate to contact me if I can be of assistance to you.   Following are the goals we discussed today:   Goals Addressed             This Visit's Progress    Assistance with Obtaining FL2   On track    Care Coordination Interventions: Solution-Focused Strategies employed:  Active listening / Reflection utilized  Emotional Support Provided Caregiver stress acknowledged  Verbalization of feelings encouraged  LCSW spoke with pt's daughter, Ainsley Spinner shared that she received paperwork from [place], requesting an updated FL2. This is due by Monday, 12/16/21 and pt's provider at [facility] is out of the office until next week [name] will follow up with facility on Monday and will update LCSW next week            Our next appointment is by telephone on 11/07 at 10 AM  Please call the care guide team at [phone] if you need to cancel or reschedule your appointment.   If you are experiencing a Mental Health or [place] or need someone to talk to, please call the Suicide and Crisis Lifeline: 988 call 911   Patient verbalizes understanding of instructions and care plan provided today and agrees to view in [place]. Active MyChart status and patient understanding of how to access instructions and care plan via MyChart confirmed with patient.     [name], MSW, [email or web address] Phone [phone] 4:50 PM

## 2021-12-12 NOTE — Patient Outreach (Signed)
  Care Coordination   Initial Visit Note   12/12/2021 Name: REIGHLYN ELMES MRN: [id number] DOB: Oct 02, 1930  JOLEIGH MINEAU is a 88 y.o. year old female who sees Farrel Conners, MD for primary care. I spoke with  [name] by phone today.  What matters to the patients health and wellness today?  Medicaid Application    Goals Addressed             This Visit's Progress    Assistance with Obtaining FL2   On track    Care Coordination Interventions: Solution-Focused Strategies employed:  Active listening / Reflection utilized  Emotional Support Provided Verbalization of feelings encouraged  LCSW spoke with pt's daughter, Ainsley Spinner shared that she has applied for MA for pt; however, is concerned that pt may have to be placed at different facility due to limited MA beds. Family are currently paying out of pocket, to assist with spending down pt's savings to qualify for MA Pt will discuss with facility about completing FL2 form LCSW encouraged family to contact her with any assistance regarding facilities that may have MA availability            SDOH assessments and interventions completed:  No     Care Coordination Interventions Activated:  Yes  Care Coordination Interventions:  Yes, provided   Follow up plan: Follow up call scheduled for 2 weeks    Encounter Outcome:  Pt. Visit Completed   [name], MSW, [email or web address] Phone [phone] 8:56 PM

## 2021-12-12 NOTE — Patient Outreach (Signed)
  Care Coordination   Follow Up Visit Note   12/12/2021 Name: Rhonda Weaver MRN: [id number] DOB: November 26, 1930  Rhonda Weaver is a 88 y.o. year old female who sees Farrel Conners, MD for primary care. I spoke with  [name] by phone today.  What matters to the patients health and wellness today?  FL2    Goals Addressed             This Visit's Progress    Assistance with Obtaining FL2   On track    Care Coordination Interventions: Solution-Focused Strategies employed:  Active listening / Reflection utilized  Emotional Support Provided Caregiver stress acknowledged  Verbalization of feelings encouraged  LCSW spoke with pt's daughter, Ainsley Spinner shared that she received paperwork from [place], requesting an updated FL2. This is due by Monday, 12/16/21 and pt's provider at [facility] is out of the office until next week [name] will follow up with facility on Monday and will update LCSW next week            SDOH assessments and interventions completed:  No     Care Coordination Interventions Activated:  Yes  Care Coordination Interventions:  Yes, provided   Follow up plan: Follow up call scheduled for 11/07    Encounter Outcome:  Pt. Visit Completed   [name], MSW, [email or web address] Phone [phone] 4:49 PM

## 2021-12-13 ENCOUNTER — Ambulatory Visit: Payer: PPO | Admitting: Family Medicine

## 2021-12-17 ENCOUNTER — Telehealth: Payer: Self-pay | Admitting: Licensed Clinical Social Worker

## 2021-12-17 ENCOUNTER — Ambulatory Visit: Payer: Self-pay | Admitting: Licensed Clinical Social Worker

## 2021-12-17 NOTE — Patient Instructions (Signed)
Visit Information  Thank you for taking time to visit with me today. Please don't hesitate to contact me if I can be of assistance to you.   Following are the goals we discussed today:   Goals Addressed             This Visit's Progress    COMPLETED: Assistance with Obtaining FL2       Care Coordination Interventions: Solution-Focused Strategies employed:  Active listening / Reflection utilized  Emotional Support Provided Verbalization of feelings encouraged  Pt's facility prioritized completion of requested FL2, which was e-mailed to DSS Pt is doing well. Daughter visited yesterday and observed pt engaging with residents and feeling secure Medicaid will notify family within 45 days about determination of eligibility LCSW reviewed upcoming appts. Pt has a scheduled f/up with PCP           If you are experiencing a Mental Health or [place] or need someone to talk to, please call the Suicide and Crisis Lifeline: 988 call 911   Patient verbalizes understanding of instructions and care plan provided today and agrees to view in [place]. Active MyChart status and patient understanding of how to access instructions and care plan via MyChart confirmed with patient.     No further follow up required:

## 2021-12-17 NOTE — Patient Outreach (Signed)
  Care Coordination   12/17/2021 Name: Rhonda Weaver MRN: [id number] DOB: 1930/09/09   Care Coordination Outreach Attempts:  An unsuccessful telephone outreach was attempted for a scheduled appointment today.  Follow Up Plan:  Additional outreach attempts will be made to offer the patient care coordination information and services.   Encounter Outcome:  No Answer  Care Coordination Interventions Activated:  No   Care Coordination Interventions:  No, not indicated    [name], MSW, [email or web address] Phone [phone] 10:05 AM

## 2021-12-17 NOTE — Patient Outreach (Signed)
  Care Coordination   Follow Up Visit Note   12/17/2021 Name: Rhonda Weaver MRN: [id number] DOB: 06-28-1930  Rhonda Weaver is a 88 y.o. year old female who sees Farrel Conners, MD for primary care. I spoke with  Drema Pry, daughter, [name], by phone today.  What matters to the patients health and wellness today?  FL2 Completion    Goals Addressed             This Visit's Progress    COMPLETED: Assistance with Obtaining FL2       Care Coordination Interventions: Solution-Focused Strategies employed:  Active listening / Reflection utilized  Emotional Support Provided Verbalization of feelings encouraged  Pt's facility prioritized completion of requested FL2, which was e-mailed to DSS Pt is doing well. Daughter visited yesterday and observed pt engaging with residents and feeling secure Medicaid will notify family within 45 days about determination of eligibility LCSW reviewed upcoming appts. Pt has a scheduled f/up with PCP            SDOH assessments and interventions completed:  No     Care Coordination Interventions Activated:  Yes  Care Coordination Interventions:  Yes, provided   Follow up plan: No further intervention required.   Encounter Outcome:  Pt. Visit Completed   [name], MSW, [email or web address] Phone [phone] 5:12 PM

## 2021-12-20 ENCOUNTER — Ambulatory Visit: Payer: PPO | Admitting: Family Medicine

## 2021-12-30 ENCOUNTER — Other Ambulatory Visit: Payer: Self-pay | Admitting: *Deleted

## 2021-12-31 MED ORDER — LOSARTAN POTASSIUM 100 MG PO TABS: 100.0000 mg | ORAL_TABLET | Freq: Every day | ORAL | 1 refills | Status: AC

## 2022-01-06 NOTE — Telephone Encounter (Signed)
Vaccine information added as below.

## 2022-01-14 ENCOUNTER — Other Ambulatory Visit: Payer: Self-pay | Admitting: Family

## 2022-01-15 DIAGNOSIS — F32A Depression, unspecified: Secondary | ICD-10-CM | POA: Diagnosis not present

## 2022-01-15 DIAGNOSIS — I479 Paroxysmal tachycardia, unspecified: Secondary | ICD-10-CM | POA: Diagnosis not present

## 2022-01-15 DIAGNOSIS — E785 Hyperlipidemia, unspecified: Secondary | ICD-10-CM | POA: Diagnosis not present

## 2022-01-15 DIAGNOSIS — I10 Essential (primary) hypertension: Secondary | ICD-10-CM | POA: Diagnosis not present

## 2022-01-15 DIAGNOSIS — K219 Gastro-esophageal reflux disease without esophagitis: Secondary | ICD-10-CM | POA: Diagnosis not present

## 2022-01-15 DIAGNOSIS — G309 Alzheimer's disease, unspecified: Secondary | ICD-10-CM | POA: Diagnosis not present

## 2022-01-20 DIAGNOSIS — R4182 Altered mental status, unspecified: Secondary | ICD-10-CM | POA: Diagnosis not present

## 2022-01-20 DIAGNOSIS — F419 Anxiety disorder, unspecified: Secondary | ICD-10-CM | POA: Diagnosis not present

## 2022-01-20 DIAGNOSIS — F03918 Unspecified dementia, unspecified severity, with other behavioral disturbance: Secondary | ICD-10-CM | POA: Diagnosis not present

## 2022-01-20 DIAGNOSIS — F4320 Adjustment disorder, unspecified: Secondary | ICD-10-CM | POA: Diagnosis not present

## 2022-01-23 DIAGNOSIS — N390 Urinary tract infection, site not specified: Secondary | ICD-10-CM | POA: Diagnosis not present

## 2022-01-24 DIAGNOSIS — E559 Vitamin D deficiency, unspecified: Secondary | ICD-10-CM | POA: Diagnosis not present

## 2022-01-24 DIAGNOSIS — E038 Other specified hypothyroidism: Secondary | ICD-10-CM | POA: Diagnosis not present

## 2022-01-24 DIAGNOSIS — E119 Type 2 diabetes mellitus without complications: Secondary | ICD-10-CM | POA: Diagnosis not present

## 2022-01-24 DIAGNOSIS — D518 Other vitamin B12 deficiency anemias: Secondary | ICD-10-CM | POA: Diagnosis not present

## 2022-01-24 DIAGNOSIS — Z79899 Other long term (current) drug therapy: Secondary | ICD-10-CM | POA: Diagnosis not present

## 2022-01-24 DIAGNOSIS — E782 Mixed hyperlipidemia: Secondary | ICD-10-CM | POA: Diagnosis not present

## 2022-01-29 DIAGNOSIS — Z111 Encounter for screening for respiratory tuberculosis: Secondary | ICD-10-CM | POA: Diagnosis not present

## 2022-02-09 DIAGNOSIS — E782 Mixed hyperlipidemia: Secondary | ICD-10-CM | POA: Diagnosis not present

## 2022-02-09 DIAGNOSIS — E038 Other specified hypothyroidism: Secondary | ICD-10-CM | POA: Diagnosis not present

## 2022-02-09 DIAGNOSIS — E559 Vitamin D deficiency, unspecified: Secondary | ICD-10-CM | POA: Diagnosis not present

## 2022-02-09 DIAGNOSIS — E119 Type 2 diabetes mellitus without complications: Secondary | ICD-10-CM | POA: Diagnosis not present

## 2022-02-09 DIAGNOSIS — D518 Other vitamin B12 deficiency anemias: Secondary | ICD-10-CM | POA: Diagnosis not present

## 2022-02-12 DIAGNOSIS — I479 Paroxysmal tachycardia, unspecified: Secondary | ICD-10-CM | POA: Diagnosis not present

## 2022-02-12 DIAGNOSIS — G309 Alzheimer's disease, unspecified: Secondary | ICD-10-CM | POA: Diagnosis not present

## 2022-02-12 DIAGNOSIS — I10 Essential (primary) hypertension: Secondary | ICD-10-CM | POA: Diagnosis not present

## 2022-02-12 DIAGNOSIS — E785 Hyperlipidemia, unspecified: Secondary | ICD-10-CM | POA: Diagnosis not present

## 2022-02-17 DIAGNOSIS — F419 Anxiety disorder, unspecified: Secondary | ICD-10-CM | POA: Diagnosis not present

## 2022-02-17 DIAGNOSIS — F03918 Unspecified dementia, unspecified severity, with other behavioral disturbance: Secondary | ICD-10-CM | POA: Diagnosis not present

## 2022-03-10 ENCOUNTER — Telehealth: Payer: Self-pay | Admitting: Family Medicine

## 2022-03-10 NOTE — Telephone Encounter (Signed)
[name] patient daughter returned call.  She stated patient is at  [facility].   She stated they are using faculty providers.  [name] also wanted to let you patient is happy there

## 2022-03-10 NOTE — Telephone Encounter (Signed)
Left message for patient to call back and schedule Medicare Annual Wellness Visit (AWV) either virtually or in office. Left  my Herbie Drape number [phone]   Last AWV 02/13/21 please schedule with Nurse Health Adviser   45 min for awv-i  in office appointments 30 min for awv-s & awv-i phone/virtual appointments

## 2022-03-12 DIAGNOSIS — E782 Mixed hyperlipidemia: Secondary | ICD-10-CM | POA: Diagnosis not present

## 2022-03-12 DIAGNOSIS — E785 Hyperlipidemia, unspecified: Secondary | ICD-10-CM | POA: Diagnosis not present

## 2022-03-12 DIAGNOSIS — G309 Alzheimer's disease, unspecified: Secondary | ICD-10-CM | POA: Diagnosis not present

## 2022-03-12 DIAGNOSIS — K219 Gastro-esophageal reflux disease without esophagitis: Secondary | ICD-10-CM | POA: Diagnosis not present

## 2022-03-12 DIAGNOSIS — I479 Paroxysmal tachycardia, unspecified: Secondary | ICD-10-CM | POA: Diagnosis not present

## 2022-03-12 DIAGNOSIS — E038 Other specified hypothyroidism: Secondary | ICD-10-CM | POA: Diagnosis not present

## 2022-03-12 DIAGNOSIS — E119 Type 2 diabetes mellitus without complications: Secondary | ICD-10-CM | POA: Diagnosis not present

## 2022-03-12 DIAGNOSIS — D518 Other vitamin B12 deficiency anemias: Secondary | ICD-10-CM | POA: Diagnosis not present

## 2022-03-12 DIAGNOSIS — E559 Vitamin D deficiency, unspecified: Secondary | ICD-10-CM | POA: Diagnosis not present

## 2022-03-12 DIAGNOSIS — I10 Essential (primary) hypertension: Secondary | ICD-10-CM | POA: Diagnosis not present

## 2022-03-14 ENCOUNTER — Ambulatory Visit: Payer: PPO | Admitting: Family Medicine

## 2022-05-26 ENCOUNTER — Ambulatory Visit: Payer: PPO | Admitting: Physician Assistant

## 2022-10-02 ENCOUNTER — Telehealth: Payer: Self-pay

## 2022-10-02 NOTE — Transitions of Care (Post Inpatient/ED Visit) (Signed)
   10/02/2022  Name: [name] MRN: [id number] DOB:   Per daughter [facility] living [facility] and is following their PCP- will no longer be seeing Dr Roma Kayser LPN [facility] Nurse Health Advisor Direct Dial [phone]

## 2023-09-29 IMAGING — CR DG CHEST 2V
2 series · 2 of 2 positions shown · non-contrast
Comparison: March 21, 2013

CLINICAL DATA: Status post fall.

EXAM:
CHEST - 2 VIEW

[chest lat]
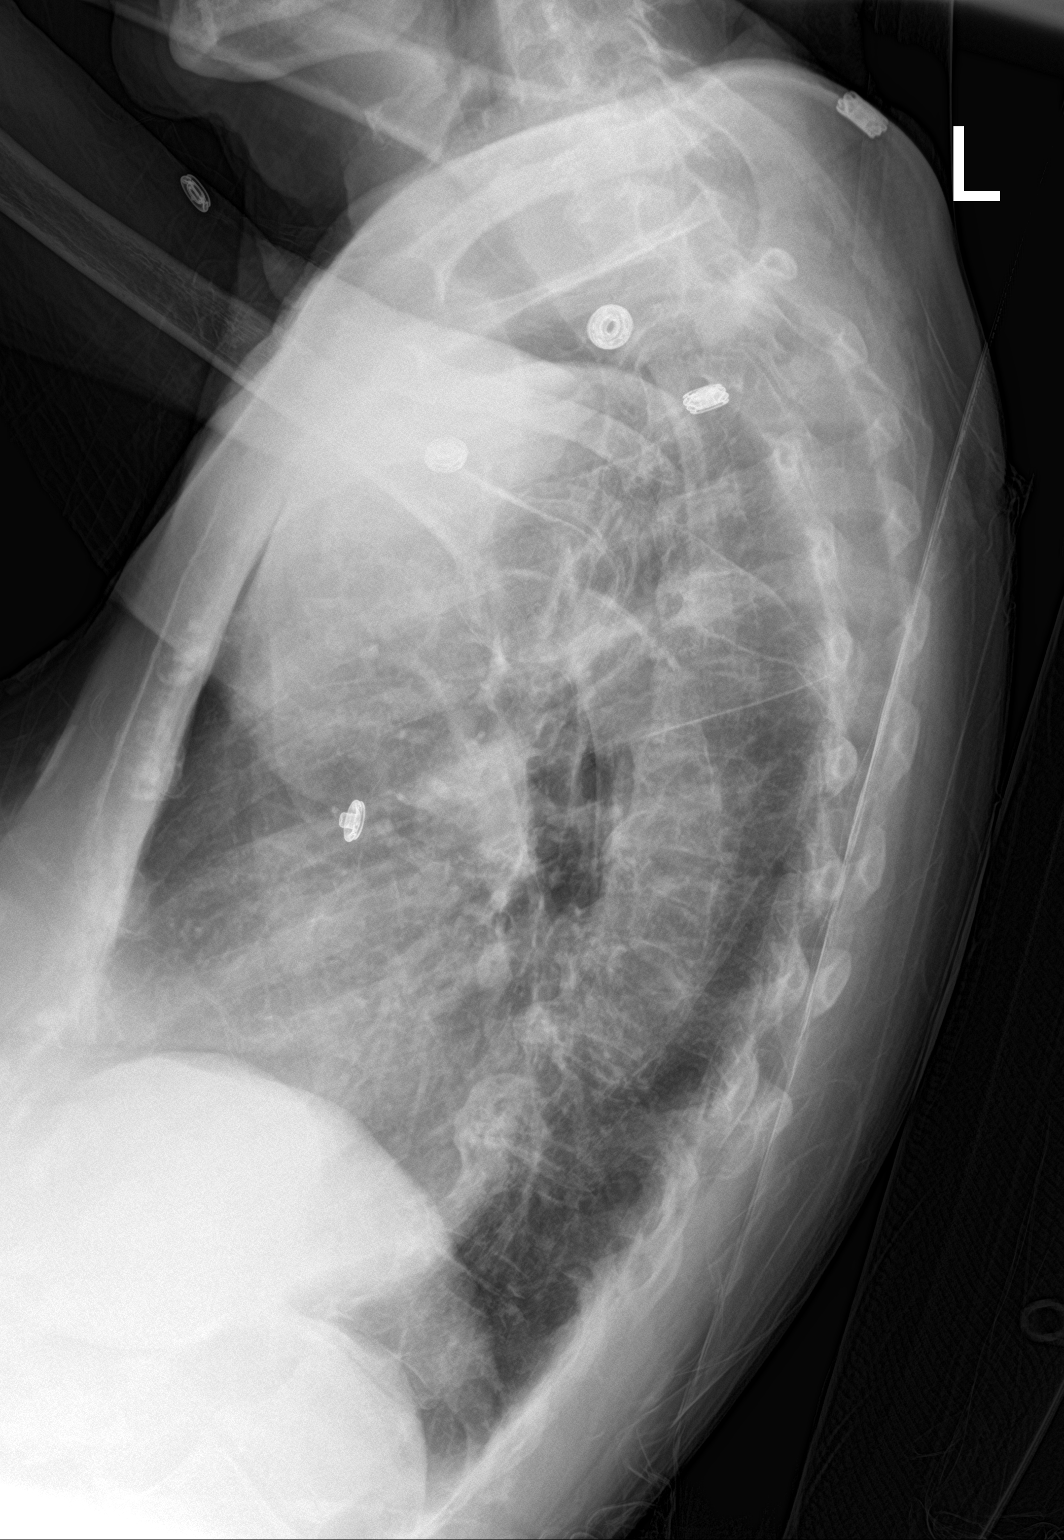

[chest ap]
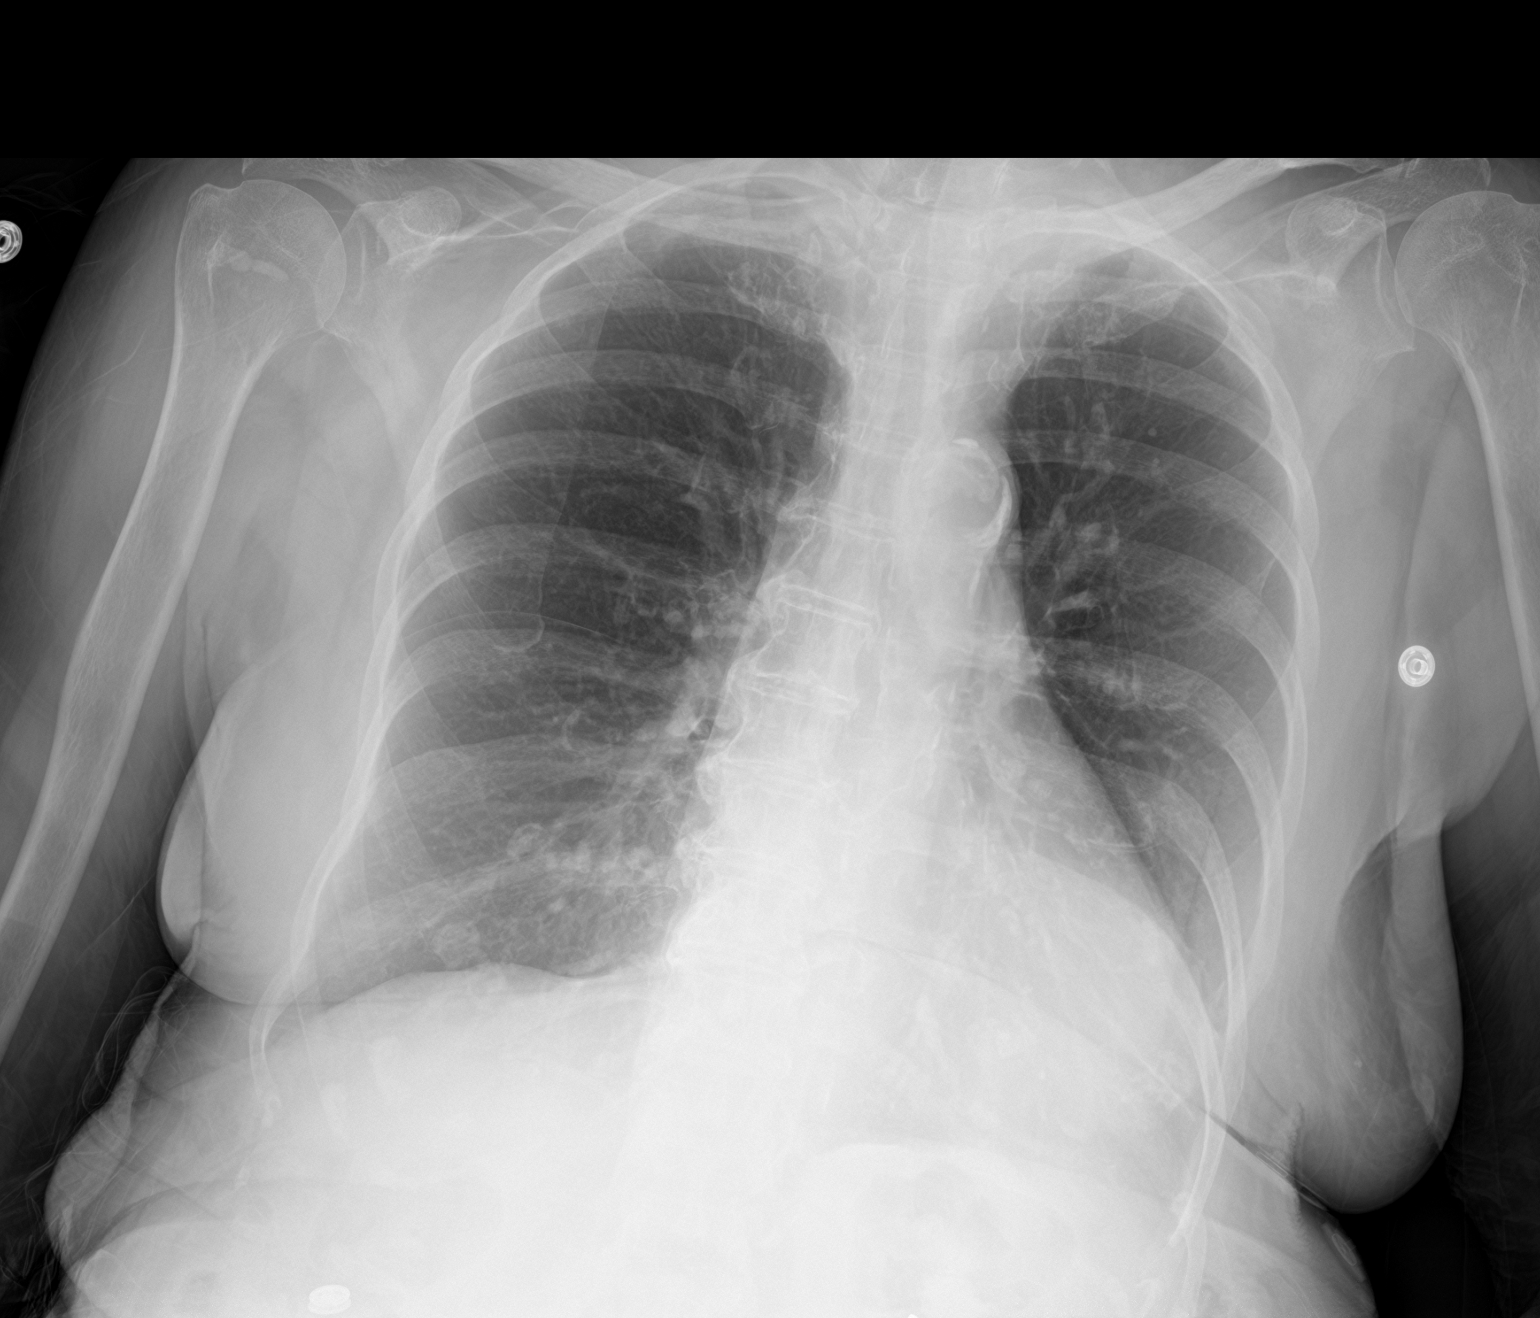

[2 of 2 positions shown; findings below may reference images not displayed]

FINDINGS: Tortuosity and calcific atherosclerotic disease of the aorta.

Cardiomediastinal silhouette is normal. Mediastinal contours appear
intact.

There is no evidence of focal airspace consolidation, pleural
effusion or pneumothorax.

Osseous structures are without acute abnormality. Soft tissues are
grossly normal.
IMPRESSION: No active cardiopulmonary disease.

## 2023-09-29 IMAGING — MR MR HEAD W/O CM
6 of 10 series · 29 of 48 positions shown · non-contrast
Comparison: None.

CLINICAL DATA: Acute neurologic deficit

EXAM:
MRI HEAD WITHOUT CONTRAST
TECHNIQUE: Multiplanar, multiecho pulse sequences of the brain and surrounding
structures were obtained without intravenous contrast.

[Series 2: DWI · axial · 3.0mm · 0.94mm/px · z∈[-78,+75]mm · 9 of 104 slices shown (1 of 2)]
[im 1/104]
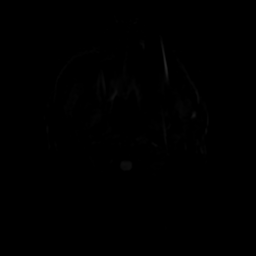
[im 13/104]
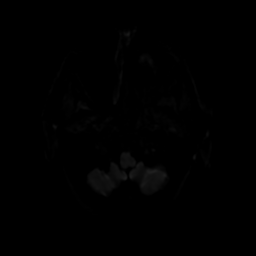
[im 26/104]
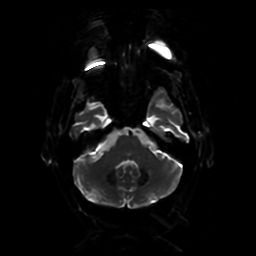
[im 39/104]
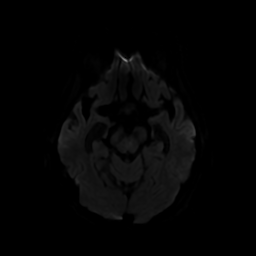
[im 52/104]
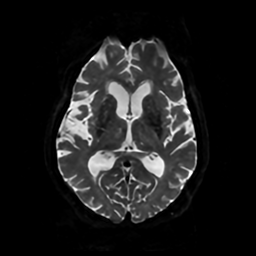
[im 65/104]
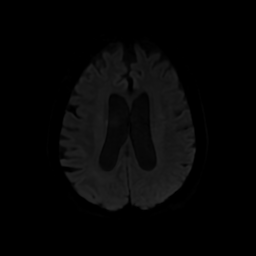
[im 78/104]
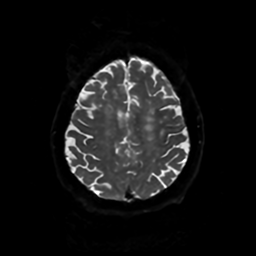
[im 91/104]
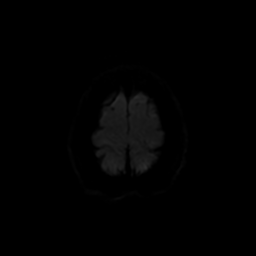
[im 104/104]
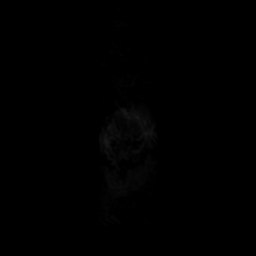

[Series 3: DWI · coronal · 4.0mm · 0.94mm/px · 7 of 76 slices shown (2 of 2)]
[im 1/76]
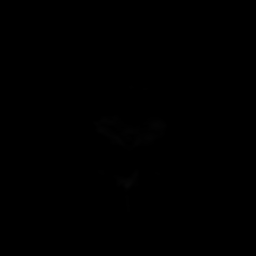
[im 13/76]
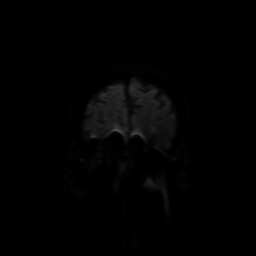
[im 26/76]
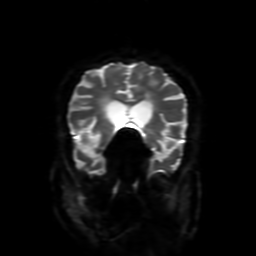
[im 38/76]
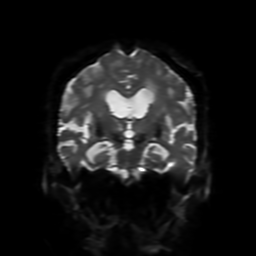
[im 51/76]
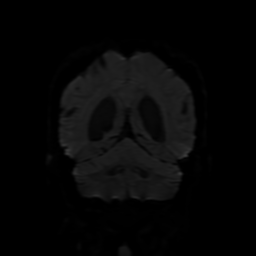
[im 63/76]
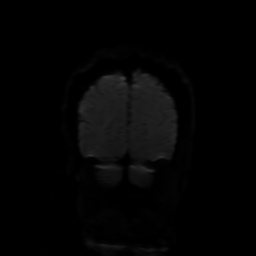
[im 76/76]
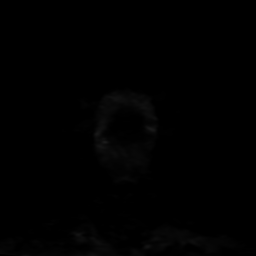

[Series 4: FLAIR · sagittal · 5.0mm · 0.23mm/px · 2 of 25 slices shown (1 of 2)]
[im 1/25]
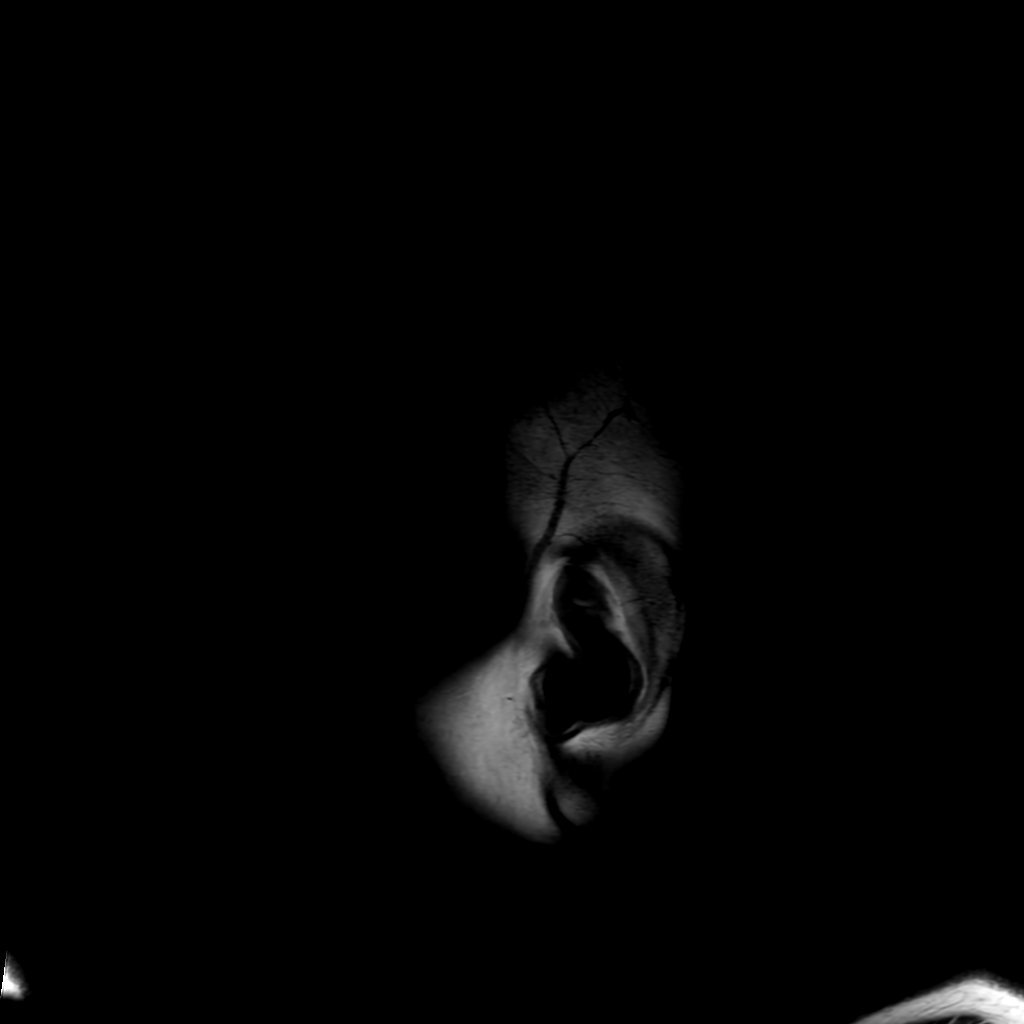
[im 25/25]
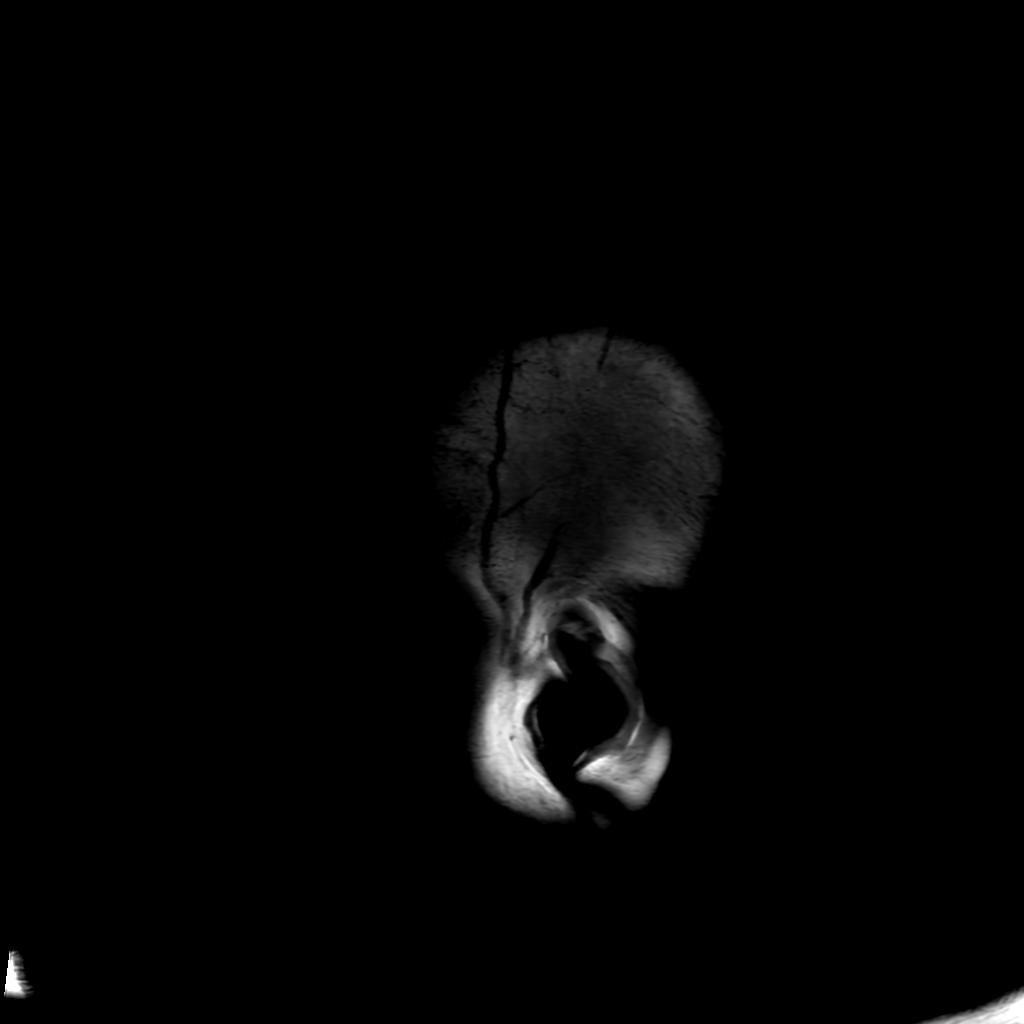

[Series 6: FLAIR · axial · 4.0mm · 0.45mm/px · z∈[-76,+73]mm · 3 of 35 slices shown (2 of 2)]
[im 1/35]
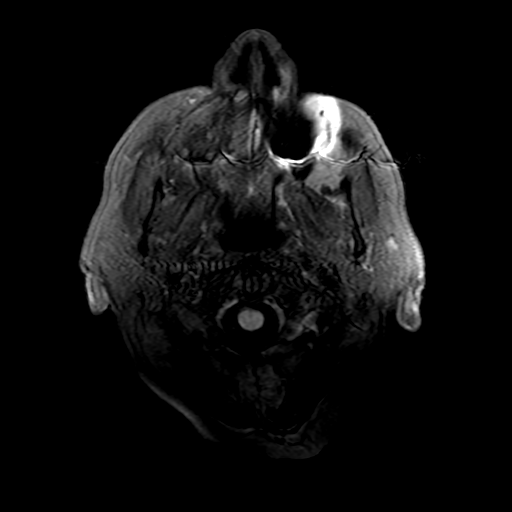
[im 18/35]
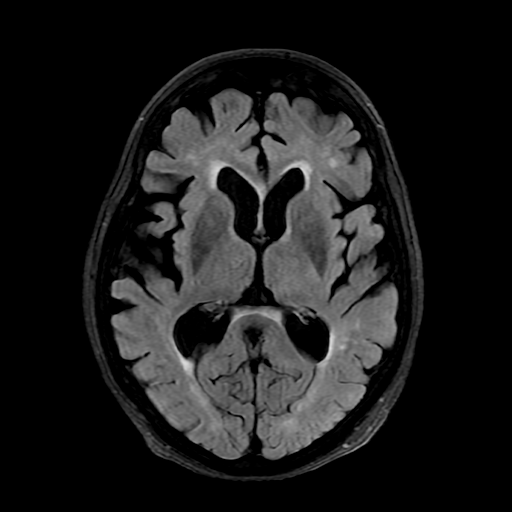
[im 35/35]
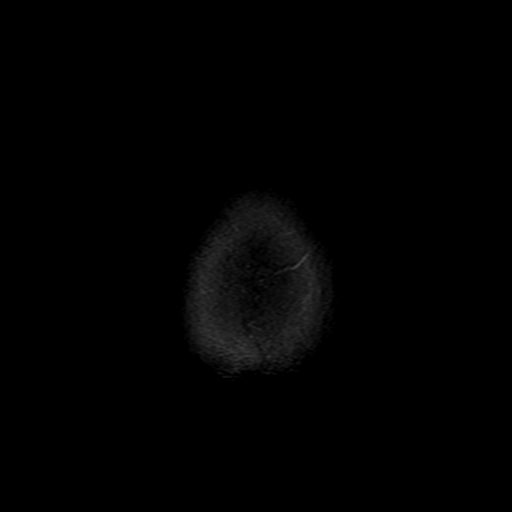

[Series 250: ADC · axial · 3.0mm · 0.94mm/px · z∈[-78,+75]mm · 5 of 52 slices shown (1 of 2)]
[im 1/52]
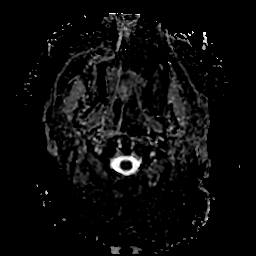
[im 13/52]
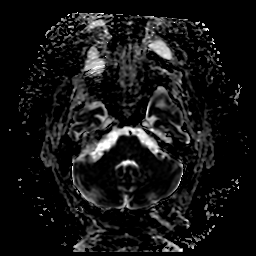
[im 26/52]
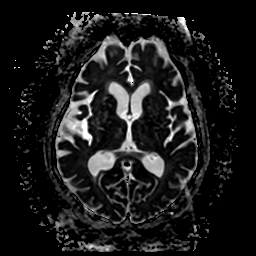
[im 39/52]
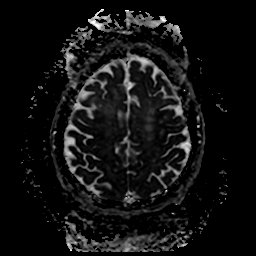
[im 52/52]
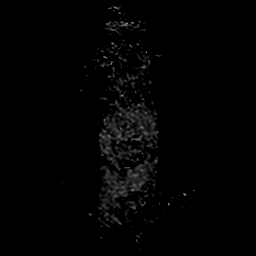

[Series 350: ADC · coronal · 4.0mm · 0.94mm/px · 3 of 38 slices shown (2 of 2)]
[im 1/38]
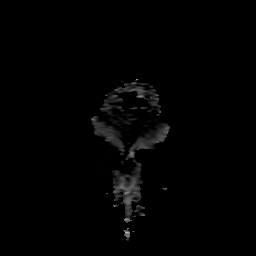
[im 19/38]
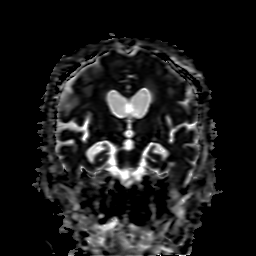
[im 38/38]
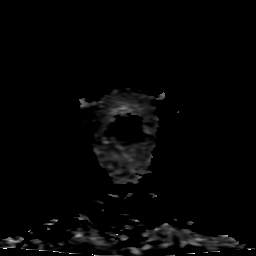

[29 of 48 positions shown; findings below may reference images not displayed]

FINDINGS: Brain: No acute infarct, mass effect or extra-axial collection. No
acute or chronic hemorrhage. There is multifocal hyperintense
T2-weighted signal within the white matter. Generalized volume loss
without a clear lobar predilection. The midline structures are
normal.

Vascular: Major flow voids are preserved.

Skull and upper cervical spine: Normal calvarium and skull base.
Visualized upper cervical spine and soft tissues are normal.

Sinuses/Orbits:No paranasal sinus fluid levels or advanced mucosal
thickening. No mastoid or middle ear effusion. Normal orbits.
IMPRESSION: 1. No acute intracranial abnormality.
2. Findings of chronic small vessel ischemia and generalized volume
loss.

## 2023-09-29 IMAGING — DX DG HIP (WITH OR WITHOUT PELVIS) 2-3V*L*
3 series · 3 of 3 positions shown · non-contrast
Comparison: None.

CLINICAL DATA: Left hip pain post fall.

EXAM:
DG HIP (WITH OR WITHOUT PELVIS) 2-3V LEFT

[t pelvis ap]
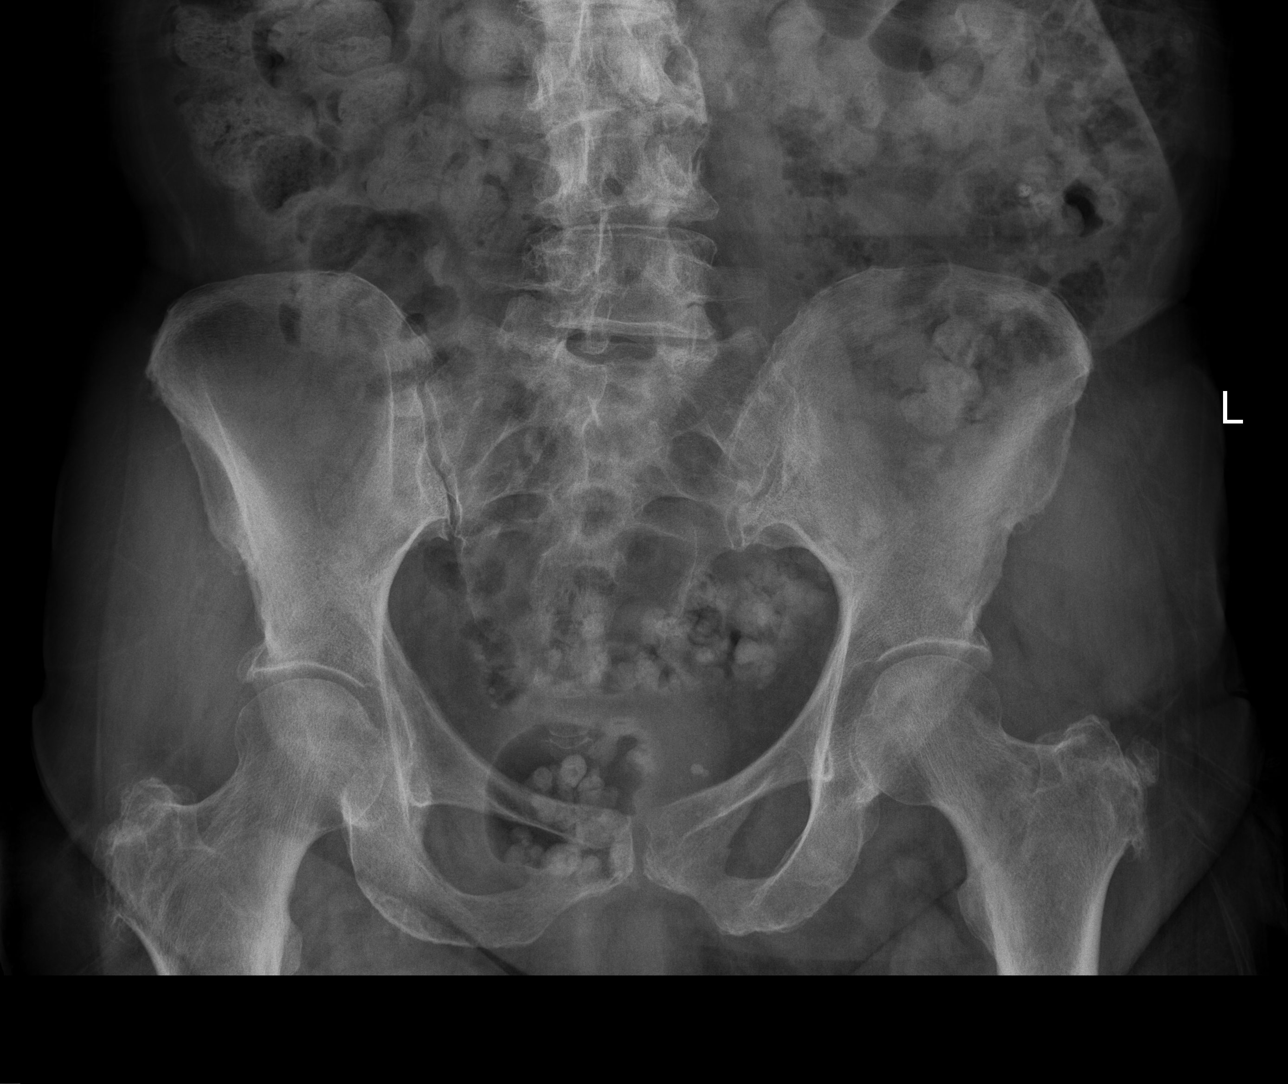

[t hip ap left]
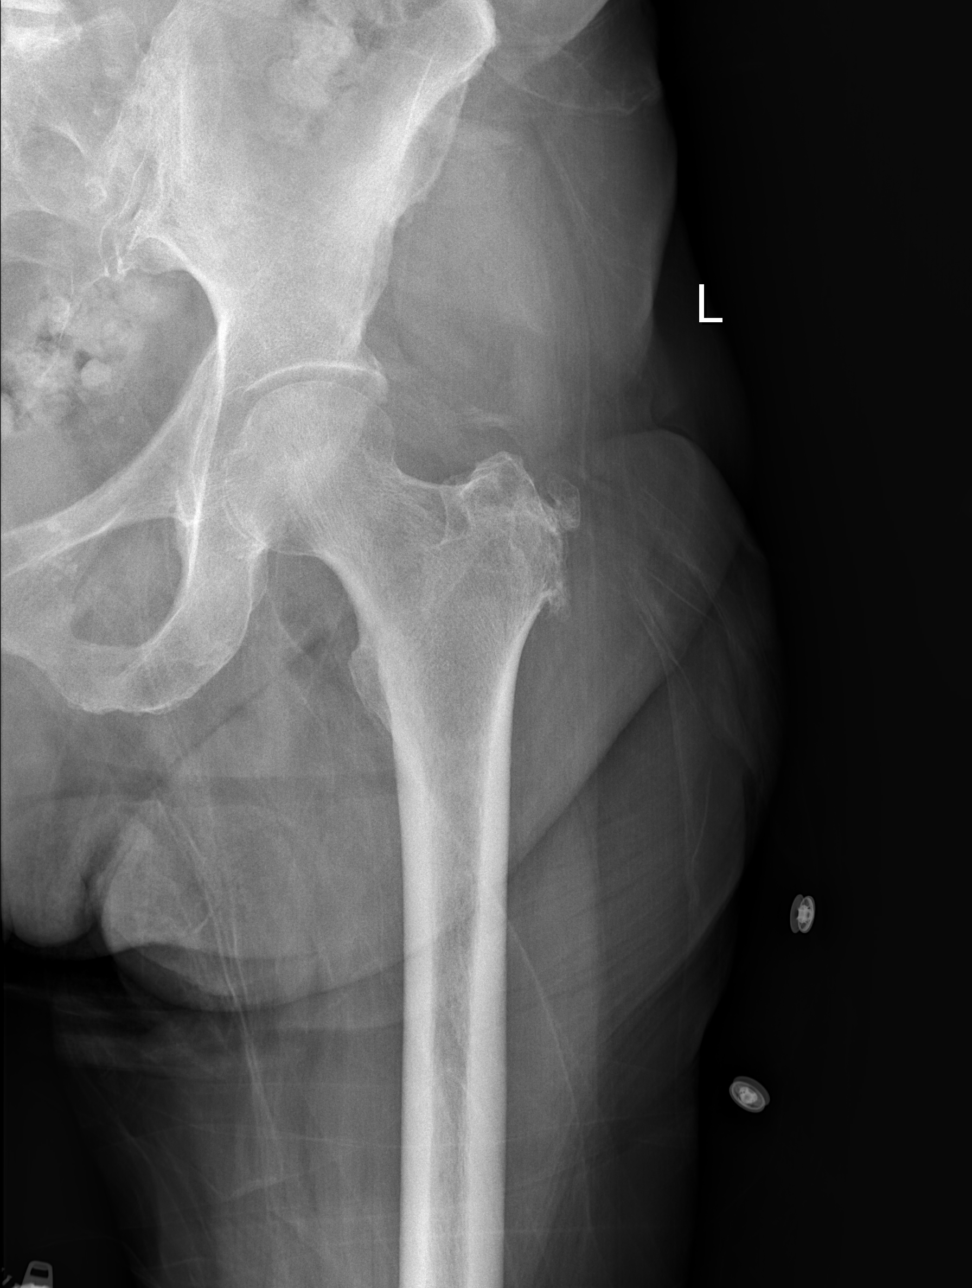

[t hip frog leg left]
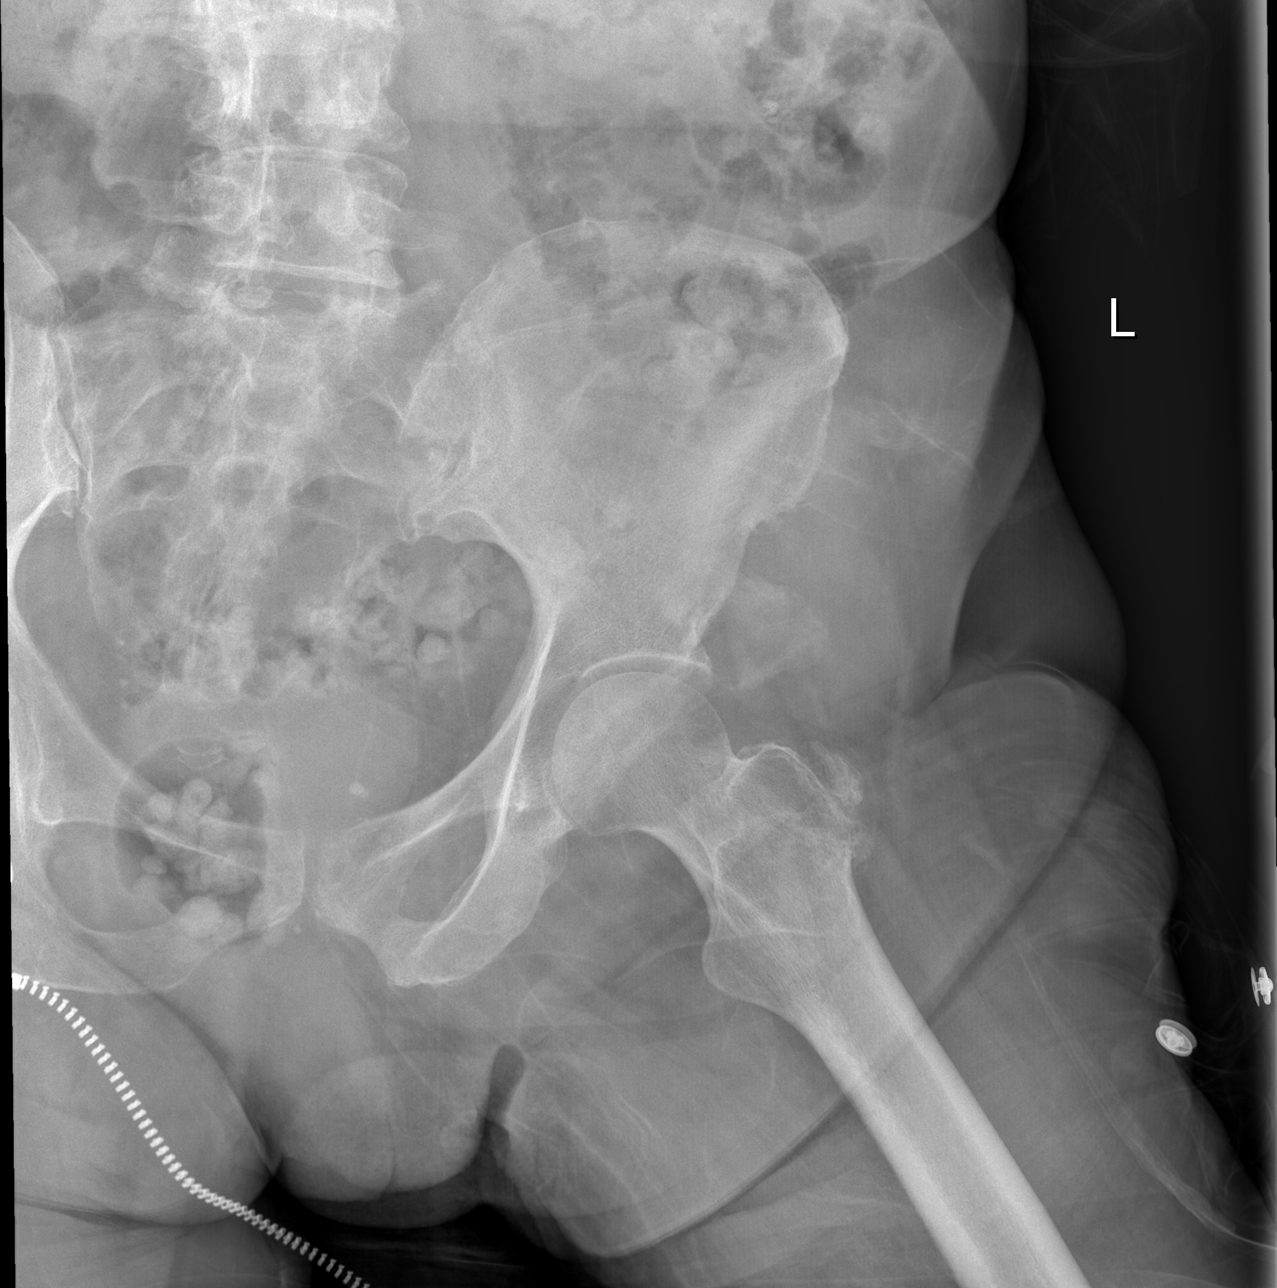

[3 of 3 positions shown; findings below may reference images not displayed]

FINDINGS: There is no evidence of hip fracture or dislocation. Heterotopic
ossification about the greater trochanter.

Large amount of formed stool throughout the colon.
IMPRESSION: 1. No acute fracture or dislocation identified about the left hip.
2. Large amount of formed stool throughout the colon.
